# Patient Record
Sex: Female | Born: 1945 | Race: White | Hispanic: No | Marital: Married | Smoking: Never smoker
Health system: Southern US, Community
[De-identification: ages and names within clinical notes are randomized; demographics above are authoritative.]

## PROBLEM LIST (undated history)

## (undated) DIAGNOSIS — G4730 Sleep apnea, unspecified: Secondary | ICD-10-CM

## (undated) DIAGNOSIS — J45909 Unspecified asthma, uncomplicated: Secondary | ICD-10-CM

## (undated) DIAGNOSIS — I10 Essential (primary) hypertension: Secondary | ICD-10-CM

## (undated) DIAGNOSIS — R748 Abnormal levels of other serum enzymes: Secondary | ICD-10-CM

## (undated) DIAGNOSIS — E119 Type 2 diabetes mellitus without complications: Secondary | ICD-10-CM

## (undated) DIAGNOSIS — Z8489 Family history of other specified conditions: Secondary | ICD-10-CM

## (undated) DIAGNOSIS — R112 Nausea with vomiting, unspecified: Secondary | ICD-10-CM

## (undated) DIAGNOSIS — R609 Edema, unspecified: Secondary | ICD-10-CM

## (undated) DIAGNOSIS — Z9889 Other specified postprocedural states: Secondary | ICD-10-CM

## (undated) DIAGNOSIS — E785 Hyperlipidemia, unspecified: Secondary | ICD-10-CM

## (undated) DIAGNOSIS — F32A Depression, unspecified: Secondary | ICD-10-CM

## (undated) DIAGNOSIS — F329 Major depressive disorder, single episode, unspecified: Secondary | ICD-10-CM

## (undated) DIAGNOSIS — E539 Vitamin B deficiency, unspecified: Secondary | ICD-10-CM

## (undated) DIAGNOSIS — D649 Anemia, unspecified: Secondary | ICD-10-CM

## (undated) DIAGNOSIS — M1990 Unspecified osteoarthritis, unspecified site: Secondary | ICD-10-CM

## (undated) DIAGNOSIS — Z973 Presence of spectacles and contact lenses: Secondary | ICD-10-CM

## (undated) DIAGNOSIS — R002 Palpitations: Secondary | ICD-10-CM

## (undated) DIAGNOSIS — Z8719 Personal history of other diseases of the digestive system: Secondary | ICD-10-CM

## (undated) DIAGNOSIS — E559 Vitamin D deficiency, unspecified: Secondary | ICD-10-CM

## (undated) DIAGNOSIS — K219 Gastro-esophageal reflux disease without esophagitis: Secondary | ICD-10-CM

## (undated) DIAGNOSIS — R519 Headache, unspecified: Secondary | ICD-10-CM

## (undated) DIAGNOSIS — Z9109 Other allergy status, other than to drugs and biological substances: Secondary | ICD-10-CM

## (undated) DIAGNOSIS — Z972 Presence of dental prosthetic device (complete) (partial): Secondary | ICD-10-CM

## (undated) DIAGNOSIS — E039 Hypothyroidism, unspecified: Secondary | ICD-10-CM

## (undated) DIAGNOSIS — M48061 Spinal stenosis, lumbar region without neurogenic claudication: Secondary | ICD-10-CM

## (undated) DIAGNOSIS — G459 Transient cerebral ischemic attack, unspecified: Secondary | ICD-10-CM

## (undated) DIAGNOSIS — R51 Headache: Secondary | ICD-10-CM

## (undated) DIAGNOSIS — H40009 Preglaucoma, unspecified, unspecified eye: Secondary | ICD-10-CM

## (undated) HISTORY — PX: BACK SURGERY: SHX140

## (undated) HISTORY — PX: OTHER SURGICAL HISTORY: SHX169

## (undated) HISTORY — PX: COLONOSCOPY W/ BIOPSIES: SHX1374

## (undated) HISTORY — PX: ABDOMINAL HYSTERECTOMY: SHX81

## (undated) HISTORY — PX: MULTIPLE TOOTH EXTRACTIONS: SHX2053

## (undated) HISTORY — PX: APPENDECTOMY: SHX54

## (undated) HISTORY — PX: TUBAL LIGATION: SHX77

## (undated) HISTORY — PX: CATARACT EXTRACTION W/ INTRAOCULAR LENS  IMPLANT, BILATERAL: SHX1307

## (undated) HISTORY — PX: CHOLECYSTECTOMY: SHX55

---

## 1998-11-25 HISTORY — PX: BREAST EXCISIONAL BIOPSY: SUR124

## 2005-02-12 ENCOUNTER — Ambulatory Visit: Payer: Self-pay | Admitting: Internal Medicine

## 2005-08-08 ENCOUNTER — Other Ambulatory Visit: Payer: Self-pay

## 2005-08-15 ENCOUNTER — Ambulatory Visit: Payer: Self-pay | Admitting: Obstetrics & Gynecology

## 2006-12-04 ENCOUNTER — Inpatient Hospital Stay: Payer: Self-pay | Admitting: Endocrinology

## 2006-12-04 ENCOUNTER — Other Ambulatory Visit: Payer: Self-pay

## 2006-12-05 ENCOUNTER — Other Ambulatory Visit: Payer: Self-pay

## 2007-03-19 ENCOUNTER — Ambulatory Visit: Payer: Self-pay | Admitting: Endocrinology

## 2007-09-30 ENCOUNTER — Ambulatory Visit: Payer: Self-pay | Admitting: General Surgery

## 2008-03-22 ENCOUNTER — Ambulatory Visit: Payer: Self-pay | Admitting: Obstetrics and Gynecology

## 2009-03-28 ENCOUNTER — Ambulatory Visit: Payer: Self-pay | Admitting: Obstetrics & Gynecology

## 2010-03-29 ENCOUNTER — Ambulatory Visit: Payer: Self-pay | Admitting: Obstetrics & Gynecology

## 2011-04-02 ENCOUNTER — Ambulatory Visit: Payer: Self-pay | Admitting: Obstetrics & Gynecology

## 2011-12-25 ENCOUNTER — Ambulatory Visit: Payer: Self-pay | Admitting: Internal Medicine

## 2012-05-19 ENCOUNTER — Ambulatory Visit: Payer: Self-pay | Admitting: Internal Medicine

## 2012-10-28 ENCOUNTER — Ambulatory Visit: Payer: Self-pay | Admitting: General Practice

## 2012-11-09 ENCOUNTER — Ambulatory Visit: Payer: Self-pay | Admitting: Orthopedic Surgery

## 2012-11-12 ENCOUNTER — Ambulatory Visit: Payer: Self-pay | Admitting: Orthopedic Surgery

## 2013-05-20 ENCOUNTER — Ambulatory Visit: Payer: Self-pay | Admitting: Internal Medicine

## 2014-05-10 DIAGNOSIS — I10 Essential (primary) hypertension: Secondary | ICD-10-CM | POA: Diagnosis present

## 2014-05-11 DIAGNOSIS — E039 Hypothyroidism, unspecified: Secondary | ICD-10-CM | POA: Diagnosis present

## 2014-07-04 ENCOUNTER — Ambulatory Visit: Payer: Self-pay | Admitting: Ophthalmology

## 2014-07-04 LAB — HEMOGLOBIN: HGB: 12.1 g/dL (ref 12.0–16.0)

## 2014-07-12 ENCOUNTER — Ambulatory Visit: Payer: Self-pay | Admitting: Ophthalmology

## 2014-07-26 ENCOUNTER — Ambulatory Visit: Payer: Self-pay | Admitting: Ophthalmology

## 2014-12-20 ENCOUNTER — Emergency Department: Payer: Self-pay | Admitting: Emergency Medicine

## 2014-12-20 LAB — PROTIME-INR
INR: 1
PROTHROMBIN TIME: 12.7 s (ref 11.5–14.7)

## 2014-12-20 LAB — URINALYSIS, COMPLETE
BACTERIA: NONE SEEN
BILIRUBIN, UR: NEGATIVE
Blood: NEGATIVE
GLUCOSE, UR: NEGATIVE mg/dL (ref 0–75)
Ketone: NEGATIVE
Leukocyte Esterase: NEGATIVE
Nitrite: NEGATIVE
PH: 5 (ref 4.5–8.0)
Protein: NEGATIVE
RBC,UR: 1 /HPF (ref 0–5)
Specific Gravity: 1.003 (ref 1.003–1.030)
WBC UR: <1 /HPF (ref 0–5)

## 2014-12-20 LAB — CBC WITH DIFFERENTIAL/PLATELET
BASOS ABS: 0 10*3/uL (ref 0.0–0.1)
BASOS PCT: 0.7 %
Eosinophil #: 0.2 10*3/uL (ref 0.0–0.7)
Eosinophil %: 2.5 %
HCT: 37.3 % (ref 35.0–47.0)
HGB: 12.2 g/dL (ref 12.0–16.0)
Lymphocyte #: 2.4 10*3/uL (ref 1.0–3.6)
Lymphocyte %: 35.5 %
MCH: 30.2 pg (ref 26.0–34.0)
MCHC: 32.6 g/dL (ref 32.0–36.0)
MCV: 93 fL (ref 80–100)
Monocyte #: 0.4 x10 3/mm (ref 0.2–0.9)
Monocyte %: 6.3 %
NEUTROS ABS: 3.8 10*3/uL (ref 1.4–6.5)
Neutrophil %: 55 %
Platelet: 230 10*3/uL (ref 150–440)
RBC: 4.03 10*6/uL (ref 3.80–5.20)
RDW: 14.5 % (ref 11.5–14.5)
WBC: 6.9 10*3/uL (ref 3.6–11.0)

## 2014-12-20 LAB — COMPREHENSIVE METABOLIC PANEL
ALT: 56 U/L (ref 14–63)
Albumin: 3.9 g/dL (ref 3.4–5.0)
Alkaline Phosphatase: 68 U/L (ref 46–116)
Anion Gap: 7 (ref 7–16)
BUN: 11 mg/dL (ref 7–18)
Bilirubin,Total: 0.5 mg/dL (ref 0.2–1.0)
CALCIUM: 9.2 mg/dL (ref 8.5–10.1)
CO2: 28 mmol/L (ref 21–32)
Chloride: 106 mmol/L (ref 98–107)
Creatinine: 0.77 mg/dL (ref 0.60–1.30)
EGFR (African American): >60
Glucose: 134 mg/dL — ABNORMAL HIGH (ref 65–99)
OSMOLALITY: 283 (ref 275–301)
Potassium: 3.5 mmol/L (ref 3.5–5.1)
SGOT(AST): 66 U/L — ABNORMAL HIGH (ref 15–37)
Sodium: 141 mmol/L (ref 136–145)
Total Protein: 7.7 g/dL (ref 6.4–8.2)

## 2014-12-20 LAB — APTT: Activated PTT: 26.1 secs (ref 23.6–35.9)

## 2014-12-20 LAB — TROPONIN I: Troponin-I: <0.02 ng/mL

## 2015-03-18 NOTE — Op Note (Signed)
PATIENT NAME:  Margaret Frye, Margaret Frye MR#:  [id number] DATE OF BIRTH:  10-16-1946  DATE OF PROCEDURE:  07/12/2014  PREOPERATIVE DIAGNOSIS: Visually significant cataract of the right eye.   POSTOPERATIVE DIAGNOSIS: Visually significant cataract of the right eye.   OPERATIVE PROCEDURE: Cataract extraction by phacoemulsification with implant of intraocular lens to the right eye.   SURGEON: Galen ManilaWilliam Nashiya Disbrow, MD  ANESTHESIA:  1. Managed anesthesia care.  2. 50-50 mixture of 0.75% bupivacaine and 4% Xylocaine given as a retrobulbar block.   COMPLICATIONS: None.   TECHNIQUE:  Stop and chop.  DESCRIPTION OF PROCEDURE: The patient was examined and consented for this procedure in the preoperative holding area and then brought back to the Operating Room where the anesthesia team employed managed anesthesia care.  3.5 milliliters of the aforementioned mixture were placed in the right orbit on an Atkinson needle without complication. The right eye was then prepped and draped in the usual sterile ophthalmic fashion. A lid speculum was placed. The side-port blade was used to create a paracentesis and the anterior chamber was filled with viscoelastic. The keratome was used to create a near clear corneal incision. The continuous curvilinear capsulorrhexis was performed with a cystotome followed by the capsulorrhexis forceps. Hydrodissection and hydrodelineation were carried out with BSS on a blunt cannula. The lens was removed in a stop-and-chop technique. The remaining cortical material was removed with the irrigation-aspiration handpiece. The capsular bag was inflated with viscoelastic and the Tecnis ZCB00 21.5-diopter lens, serial number [id number] was placed in the capsular bag without complication. The remaining viscoelastic was removed from the eye with the irrigation-aspiration handpiece. The wounds were hydrated. The anterior chamber was flushed with Miostat and the eye was inflated to a physiologic pressure. 0.1 mL  of cefuroxime concentration 10 mg/mL was placed in the anterior chamber. The wounds were found to be water tight. The eye was dressed with Vigamox followed by Maxitrol ointment and a protective shield was placed. The patient will followup with me in one day.    ____________________________ Jerilee FieldWilliam L. Ruven Corradi, MD wlp:TT D: 07/12/2014 20:30:52 ET T: 07/12/2014 21:41:30 ET JOB#: [id number]  cc: Asser Lucena L. Pegeen Stiger, MD, <Dictator> Jerilee FieldWILLIAM L Demitria Hay MD ELECTRONICALLY SIGNED 07/13/2014 8:51

## 2015-10-03 ENCOUNTER — Other Ambulatory Visit: Payer: Self-pay | Admitting: Internal Medicine

## 2015-10-03 DIAGNOSIS — M7989 Other specified soft tissue disorders: Secondary | ICD-10-CM

## 2015-10-09 ENCOUNTER — Other Ambulatory Visit: Payer: Self-pay | Admitting: Orthopedic Surgery

## 2015-10-09 ENCOUNTER — Ambulatory Visit
Admission: RE | Admit: 2015-10-09 | Discharge: 2015-10-09 | Disposition: A | Discharge status: Home / Self Care | Payer: PPO | Source: Ambulatory Visit | Attending: Internal Medicine | Admitting: Internal Medicine

## 2015-10-09 DIAGNOSIS — M9943 Connective tissue stenosis of neural canal of lumbar region: Secondary | ICD-10-CM

## 2015-10-09 DIAGNOSIS — M7989 Other specified soft tissue disorders: Secondary | ICD-10-CM | POA: Insufficient documentation

## 2015-10-26 ENCOUNTER — Ambulatory Visit
Admission: RE | Admit: 2015-10-26 | Discharge: 2015-10-26 | Disposition: A | Discharge status: Home / Self Care | Payer: PPO | Source: Ambulatory Visit | Attending: Orthopedic Surgery | Admitting: Orthopedic Surgery

## 2015-10-26 DIAGNOSIS — M9943 Connective tissue stenosis of neural canal of lumbar region: Secondary | ICD-10-CM | POA: Insufficient documentation

## 2015-10-26 DIAGNOSIS — M5126 Other intervertebral disc displacement, lumbar region: Secondary | ICD-10-CM | POA: Insufficient documentation

## 2015-12-01 DIAGNOSIS — M5416 Radiculopathy, lumbar region: Secondary | ICD-10-CM | POA: Diagnosis not present

## 2015-12-01 DIAGNOSIS — M5126 Other intervertebral disc displacement, lumbar region: Secondary | ICD-10-CM | POA: Diagnosis not present

## 2015-12-21 DIAGNOSIS — L400 Psoriasis vulgaris: Secondary | ICD-10-CM | POA: Diagnosis not present

## 2015-12-21 DIAGNOSIS — R208 Other disturbances of skin sensation: Secondary | ICD-10-CM | POA: Diagnosis not present

## 2015-12-25 DIAGNOSIS — G8929 Other chronic pain: Secondary | ICD-10-CM | POA: Diagnosis not present

## 2015-12-25 DIAGNOSIS — M545 Low back pain: Secondary | ICD-10-CM | POA: Diagnosis not present

## 2015-12-29 DIAGNOSIS — E782 Mixed hyperlipidemia: Secondary | ICD-10-CM | POA: Diagnosis not present

## 2015-12-29 DIAGNOSIS — I10 Essential (primary) hypertension: Secondary | ICD-10-CM | POA: Diagnosis not present

## 2015-12-29 DIAGNOSIS — Z79899 Other long term (current) drug therapy: Secondary | ICD-10-CM | POA: Diagnosis not present

## 2015-12-29 DIAGNOSIS — E039 Hypothyroidism, unspecified: Secondary | ICD-10-CM | POA: Diagnosis not present

## 2015-12-29 DIAGNOSIS — R739 Hyperglycemia, unspecified: Secondary | ICD-10-CM | POA: Diagnosis not present

## 2016-01-03 DIAGNOSIS — I10 Essential (primary) hypertension: Secondary | ICD-10-CM | POA: Diagnosis not present

## 2016-01-03 DIAGNOSIS — Z1239 Encounter for other screening for malignant neoplasm of breast: Secondary | ICD-10-CM | POA: Diagnosis not present

## 2016-01-03 DIAGNOSIS — E782 Mixed hyperlipidemia: Secondary | ICD-10-CM | POA: Diagnosis not present

## 2016-01-03 DIAGNOSIS — E039 Hypothyroidism, unspecified: Secondary | ICD-10-CM | POA: Diagnosis not present

## 2016-01-03 DIAGNOSIS — R739 Hyperglycemia, unspecified: Secondary | ICD-10-CM | POA: Diagnosis not present

## 2016-01-03 DIAGNOSIS — G894 Chronic pain syndrome: Secondary | ICD-10-CM | POA: Diagnosis not present

## 2016-01-03 DIAGNOSIS — E119 Type 2 diabetes mellitus without complications: Secondary | ICD-10-CM | POA: Diagnosis not present

## 2016-01-15 ENCOUNTER — Other Ambulatory Visit: Payer: Self-pay | Admitting: Internal Medicine

## 2016-01-15 DIAGNOSIS — Z1231 Encounter for screening mammogram for malignant neoplasm of breast: Secondary | ICD-10-CM

## 2016-01-24 ENCOUNTER — Ambulatory Visit
Admission: RE | Admit: 2016-01-24 | Discharge: 2016-01-24 | Disposition: A | Discharge status: Home / Self Care | Payer: PPO | Source: Ambulatory Visit | Attending: Internal Medicine | Admitting: Internal Medicine

## 2016-01-24 DIAGNOSIS — Z1231 Encounter for screening mammogram for malignant neoplasm of breast: Secondary | ICD-10-CM | POA: Insufficient documentation

## 2016-01-31 DIAGNOSIS — L400 Psoriasis vulgaris: Secondary | ICD-10-CM | POA: Diagnosis not present

## 2016-02-14 DIAGNOSIS — R51 Headache: Secondary | ICD-10-CM | POA: Diagnosis not present

## 2016-02-14 DIAGNOSIS — G894 Chronic pain syndrome: Secondary | ICD-10-CM | POA: Diagnosis not present

## 2016-04-01 DIAGNOSIS — B078 Other viral warts: Secondary | ICD-10-CM | POA: Diagnosis not present

## 2016-04-01 DIAGNOSIS — L819 Disorder of pigmentation, unspecified: Secondary | ICD-10-CM | POA: Diagnosis not present

## 2016-04-01 DIAGNOSIS — L400 Psoriasis vulgaris: Secondary | ICD-10-CM | POA: Diagnosis not present

## 2016-05-13 DIAGNOSIS — L400 Psoriasis vulgaris: Secondary | ICD-10-CM | POA: Diagnosis not present

## 2016-05-13 DIAGNOSIS — B078 Other viral warts: Secondary | ICD-10-CM | POA: Diagnosis not present

## 2016-05-13 DIAGNOSIS — R21 Rash and other nonspecific skin eruption: Secondary | ICD-10-CM | POA: Diagnosis not present

## 2016-05-17 DIAGNOSIS — E782 Mixed hyperlipidemia: Secondary | ICD-10-CM | POA: Diagnosis not present

## 2016-05-17 DIAGNOSIS — Z79899 Other long term (current) drug therapy: Secondary | ICD-10-CM | POA: Diagnosis not present

## 2016-05-17 DIAGNOSIS — E039 Hypothyroidism, unspecified: Secondary | ICD-10-CM | POA: Diagnosis not present

## 2016-05-17 DIAGNOSIS — E119 Type 2 diabetes mellitus without complications: Secondary | ICD-10-CM | POA: Diagnosis not present

## 2016-05-17 DIAGNOSIS — I10 Essential (primary) hypertension: Secondary | ICD-10-CM | POA: Diagnosis not present

## 2016-05-17 DIAGNOSIS — G894 Chronic pain syndrome: Secondary | ICD-10-CM | POA: Diagnosis not present

## 2016-07-03 DIAGNOSIS — Z79899 Other long term (current) drug therapy: Secondary | ICD-10-CM | POA: Diagnosis not present

## 2016-07-03 DIAGNOSIS — L400 Psoriasis vulgaris: Secondary | ICD-10-CM | POA: Diagnosis not present

## 2016-07-03 DIAGNOSIS — L0390 Cellulitis, unspecified: Secondary | ICD-10-CM | POA: Diagnosis not present

## 2016-07-03 DIAGNOSIS — B078 Other viral warts: Secondary | ICD-10-CM | POA: Diagnosis not present

## 2016-07-24 DIAGNOSIS — M47816 Spondylosis without myelopathy or radiculopathy, lumbar region: Secondary | ICD-10-CM | POA: Diagnosis not present

## 2016-07-24 DIAGNOSIS — S39012A Strain of muscle, fascia and tendon of lower back, initial encounter: Secondary | ICD-10-CM | POA: Diagnosis not present

## 2016-08-26 DIAGNOSIS — E119 Type 2 diabetes mellitus without complications: Secondary | ICD-10-CM | POA: Diagnosis not present

## 2016-08-26 DIAGNOSIS — N390 Urinary tract infection, site not specified: Secondary | ICD-10-CM | POA: Diagnosis not present

## 2016-08-26 DIAGNOSIS — Z1211 Encounter for screening for malignant neoplasm of colon: Secondary | ICD-10-CM | POA: Diagnosis not present

## 2016-08-26 DIAGNOSIS — F5104 Psychophysiologic insomnia: Secondary | ICD-10-CM | POA: Diagnosis not present

## 2016-08-26 DIAGNOSIS — G894 Chronic pain syndrome: Secondary | ICD-10-CM | POA: Diagnosis not present

## 2016-08-26 DIAGNOSIS — Z0000 Encounter for general adult medical examination without abnormal findings: Secondary | ICD-10-CM | POA: Diagnosis not present

## 2016-08-26 DIAGNOSIS — E782 Mixed hyperlipidemia: Secondary | ICD-10-CM | POA: Diagnosis not present

## 2016-08-26 DIAGNOSIS — Z79899 Other long term (current) drug therapy: Secondary | ICD-10-CM | POA: Diagnosis not present

## 2016-08-26 DIAGNOSIS — Z9109 Other allergy status, other than to drugs and biological substances: Secondary | ICD-10-CM | POA: Diagnosis not present

## 2016-08-26 DIAGNOSIS — I10 Essential (primary) hypertension: Secondary | ICD-10-CM | POA: Diagnosis not present

## 2016-08-26 DIAGNOSIS — E039 Hypothyroidism, unspecified: Secondary | ICD-10-CM | POA: Diagnosis not present

## 2016-08-26 DIAGNOSIS — K219 Gastro-esophageal reflux disease without esophagitis: Secondary | ICD-10-CM | POA: Diagnosis not present

## 2016-08-26 DIAGNOSIS — Z23 Encounter for immunization: Secondary | ICD-10-CM | POA: Diagnosis not present

## 2016-09-09 DIAGNOSIS — E039 Hypothyroidism, unspecified: Secondary | ICD-10-CM | POA: Diagnosis not present

## 2016-09-09 DIAGNOSIS — I10 Essential (primary) hypertension: Secondary | ICD-10-CM | POA: Diagnosis not present

## 2016-09-09 DIAGNOSIS — E119 Type 2 diabetes mellitus without complications: Secondary | ICD-10-CM | POA: Diagnosis not present

## 2016-09-09 DIAGNOSIS — E782 Mixed hyperlipidemia: Secondary | ICD-10-CM | POA: Diagnosis not present

## 2016-09-09 DIAGNOSIS — Z79899 Other long term (current) drug therapy: Secondary | ICD-10-CM | POA: Diagnosis not present

## 2016-09-12 DIAGNOSIS — L400 Psoriasis vulgaris: Secondary | ICD-10-CM | POA: Diagnosis not present

## 2016-09-12 DIAGNOSIS — L4052 Psoriatic arthritis mutilans: Secondary | ICD-10-CM | POA: Diagnosis not present

## 2016-09-12 DIAGNOSIS — Z79899 Other long term (current) drug therapy: Secondary | ICD-10-CM | POA: Diagnosis not present

## 2016-10-21 DIAGNOSIS — L400 Psoriasis vulgaris: Secondary | ICD-10-CM | POA: Diagnosis not present

## 2016-11-28 DIAGNOSIS — Z1211 Encounter for screening for malignant neoplasm of colon: Secondary | ICD-10-CM | POA: Diagnosis not present

## 2016-11-28 DIAGNOSIS — E119 Type 2 diabetes mellitus without complications: Secondary | ICD-10-CM | POA: Diagnosis not present

## 2016-11-28 DIAGNOSIS — E782 Mixed hyperlipidemia: Secondary | ICD-10-CM | POA: Diagnosis not present

## 2016-11-28 DIAGNOSIS — E559 Vitamin D deficiency, unspecified: Secondary | ICD-10-CM | POA: Diagnosis not present

## 2016-11-28 DIAGNOSIS — Z79899 Other long term (current) drug therapy: Secondary | ICD-10-CM | POA: Diagnosis not present

## 2016-11-28 DIAGNOSIS — E039 Hypothyroidism, unspecified: Secondary | ICD-10-CM | POA: Diagnosis not present

## 2016-11-28 DIAGNOSIS — I10 Essential (primary) hypertension: Secondary | ICD-10-CM | POA: Diagnosis not present

## 2016-11-28 DIAGNOSIS — Z1231 Encounter for screening mammogram for malignant neoplasm of breast: Secondary | ICD-10-CM | POA: Diagnosis not present

## 2016-11-28 DIAGNOSIS — G894 Chronic pain syndrome: Secondary | ICD-10-CM | POA: Diagnosis not present

## 2016-12-24 DIAGNOSIS — Z1211 Encounter for screening for malignant neoplasm of colon: Secondary | ICD-10-CM | POA: Diagnosis not present

## 2017-01-22 DIAGNOSIS — L400 Psoriasis vulgaris: Secondary | ICD-10-CM | POA: Diagnosis not present

## 2017-03-04 DIAGNOSIS — E119 Type 2 diabetes mellitus without complications: Secondary | ICD-10-CM | POA: Diagnosis not present

## 2017-03-04 DIAGNOSIS — E782 Mixed hyperlipidemia: Secondary | ICD-10-CM | POA: Diagnosis not present

## 2017-03-04 DIAGNOSIS — E039 Hypothyroidism, unspecified: Secondary | ICD-10-CM | POA: Diagnosis not present

## 2017-03-04 DIAGNOSIS — I10 Essential (primary) hypertension: Secondary | ICD-10-CM | POA: Diagnosis not present

## 2017-03-04 DIAGNOSIS — Z79899 Other long term (current) drug therapy: Secondary | ICD-10-CM | POA: Diagnosis not present

## 2017-03-04 DIAGNOSIS — E559 Vitamin D deficiency, unspecified: Secondary | ICD-10-CM | POA: Diagnosis not present

## 2017-03-11 DIAGNOSIS — E039 Hypothyroidism, unspecified: Secondary | ICD-10-CM | POA: Diagnosis not present

## 2017-03-11 DIAGNOSIS — J4520 Mild intermittent asthma, uncomplicated: Secondary | ICD-10-CM | POA: Diagnosis not present

## 2017-03-11 DIAGNOSIS — M5442 Lumbago with sciatica, left side: Secondary | ICD-10-CM | POA: Diagnosis not present

## 2017-03-11 DIAGNOSIS — R945 Abnormal results of liver function studies: Secondary | ICD-10-CM | POA: Diagnosis not present

## 2017-03-11 DIAGNOSIS — E119 Type 2 diabetes mellitus without complications: Secondary | ICD-10-CM | POA: Diagnosis not present

## 2017-03-11 DIAGNOSIS — I10 Essential (primary) hypertension: Secondary | ICD-10-CM | POA: Diagnosis not present

## 2017-03-11 DIAGNOSIS — E782 Mixed hyperlipidemia: Secondary | ICD-10-CM | POA: Diagnosis not present

## 2017-03-11 DIAGNOSIS — G894 Chronic pain syndrome: Secondary | ICD-10-CM | POA: Diagnosis not present

## 2017-03-12 ENCOUNTER — Other Ambulatory Visit: Payer: Self-pay | Admitting: Internal Medicine

## 2017-03-12 DIAGNOSIS — M5442 Lumbago with sciatica, left side: Secondary | ICD-10-CM

## 2017-03-21 ENCOUNTER — Ambulatory Visit
Admission: RE | Admit: 2017-03-21 | Discharge: 2017-03-21 | Disposition: A | Discharge status: Home / Self Care | Payer: PPO | Source: Ambulatory Visit | Attending: Internal Medicine | Admitting: Internal Medicine

## 2017-03-21 DIAGNOSIS — M5136 Other intervertebral disc degeneration, lumbar region: Secondary | ICD-10-CM | POA: Diagnosis not present

## 2017-03-21 DIAGNOSIS — M5126 Other intervertebral disc displacement, lumbar region: Secondary | ICD-10-CM | POA: Insufficient documentation

## 2017-03-21 DIAGNOSIS — M48061 Spinal stenosis, lumbar region without neurogenic claudication: Secondary | ICD-10-CM | POA: Insufficient documentation

## 2017-03-21 DIAGNOSIS — M4696 Unspecified inflammatory spondylopathy, lumbar region: Secondary | ICD-10-CM | POA: Insufficient documentation

## 2017-03-21 DIAGNOSIS — M2578 Osteophyte, vertebrae: Secondary | ICD-10-CM | POA: Insufficient documentation

## 2017-03-21 DIAGNOSIS — M545 Low back pain: Secondary | ICD-10-CM | POA: Diagnosis not present

## 2017-03-21 DIAGNOSIS — M5442 Lumbago with sciatica, left side: Secondary | ICD-10-CM

## 2017-03-24 DIAGNOSIS — L400 Psoriasis vulgaris: Secondary | ICD-10-CM | POA: Diagnosis not present

## 2017-04-22 DIAGNOSIS — G8929 Other chronic pain: Secondary | ICD-10-CM | POA: Diagnosis not present

## 2017-04-22 DIAGNOSIS — Z9889 Other specified postprocedural states: Secondary | ICD-10-CM | POA: Diagnosis not present

## 2017-04-22 DIAGNOSIS — M47816 Spondylosis without myelopathy or radiculopathy, lumbar region: Secondary | ICD-10-CM | POA: Diagnosis not present

## 2017-04-22 DIAGNOSIS — M5441 Lumbago with sciatica, right side: Secondary | ICD-10-CM | POA: Diagnosis not present

## 2017-04-22 DIAGNOSIS — M5416 Radiculopathy, lumbar region: Secondary | ICD-10-CM | POA: Diagnosis not present

## 2017-04-22 DIAGNOSIS — M5442 Lumbago with sciatica, left side: Secondary | ICD-10-CM | POA: Diagnosis not present

## 2017-04-23 DIAGNOSIS — G8929 Other chronic pain: Secondary | ICD-10-CM | POA: Diagnosis not present

## 2017-04-23 DIAGNOSIS — M47816 Spondylosis without myelopathy or radiculopathy, lumbar region: Secondary | ICD-10-CM | POA: Diagnosis not present

## 2017-04-23 DIAGNOSIS — M5441 Lumbago with sciatica, right side: Secondary | ICD-10-CM | POA: Diagnosis not present

## 2017-04-23 DIAGNOSIS — M5442 Lumbago with sciatica, left side: Secondary | ICD-10-CM | POA: Diagnosis not present

## 2017-04-29 ENCOUNTER — Ambulatory Visit
Admission: RE | Admit: 2017-04-29 | Discharge: 2017-04-29 | Disposition: A | Discharge status: Home / Self Care | Payer: PPO | Source: Ambulatory Visit | Attending: Neurological Surgery | Admitting: Neurological Surgery

## 2017-04-29 ENCOUNTER — Other Ambulatory Visit: Payer: Self-pay | Admitting: Neurological Surgery

## 2017-04-29 DIAGNOSIS — M47816 Spondylosis without myelopathy or radiculopathy, lumbar region: Secondary | ICD-10-CM | POA: Diagnosis not present

## 2017-04-29 DIAGNOSIS — M5416 Radiculopathy, lumbar region: Secondary | ICD-10-CM

## 2017-04-29 DIAGNOSIS — M549 Dorsalgia, unspecified: Secondary | ICD-10-CM

## 2017-04-29 DIAGNOSIS — M4184 Other forms of scoliosis, thoracic region: Secondary | ICD-10-CM | POA: Diagnosis not present

## 2017-04-29 DIAGNOSIS — Z9889 Other specified postprocedural states: Secondary | ICD-10-CM

## 2017-04-29 DIAGNOSIS — M4186 Other forms of scoliosis, lumbar region: Secondary | ICD-10-CM | POA: Insufficient documentation

## 2017-04-29 DIAGNOSIS — Z981 Arthrodesis status: Secondary | ICD-10-CM | POA: Insufficient documentation

## 2017-04-29 DIAGNOSIS — M5430 Sciatica, unspecified side: Secondary | ICD-10-CM | POA: Diagnosis not present

## 2017-04-29 DIAGNOSIS — M545 Low back pain: Secondary | ICD-10-CM | POA: Diagnosis not present

## 2017-05-21 ENCOUNTER — Other Ambulatory Visit: Payer: Self-pay | Admitting: Neurosurgery

## 2017-05-21 DIAGNOSIS — M48062 Spinal stenosis, lumbar region with neurogenic claudication: Secondary | ICD-10-CM | POA: Diagnosis not present

## 2017-06-05 ENCOUNTER — Encounter (HOSPITAL_COMMUNITY): Payer: Self-pay

## 2017-06-05 ENCOUNTER — Encounter (HOSPITAL_COMMUNITY)
Admission: RE | Admit: 2017-06-05 | Discharge: 2017-06-05 | Disposition: A | Discharge status: Home / Self Care | Payer: PPO | Source: Ambulatory Visit | Attending: Neurosurgery | Admitting: Neurosurgery

## 2017-06-05 DIAGNOSIS — Z01810 Encounter for preprocedural cardiovascular examination: Secondary | ICD-10-CM | POA: Insufficient documentation

## 2017-06-05 DIAGNOSIS — Z01812 Encounter for preprocedural laboratory examination: Secondary | ICD-10-CM | POA: Insufficient documentation

## 2017-06-05 HISTORY — DX: Major depressive disorder, single episode, unspecified: F32.9

## 2017-06-05 HISTORY — DX: Nausea with vomiting, unspecified: R11.2

## 2017-06-05 HISTORY — DX: Abnormal levels of other serum enzymes: R74.8

## 2017-06-05 HISTORY — DX: Transient cerebral ischemic attack, unspecified: G45.9

## 2017-06-05 HISTORY — DX: Anemia, unspecified: D64.9

## 2017-06-05 HISTORY — DX: Unspecified osteoarthritis, unspecified site: M19.90

## 2017-06-05 HISTORY — DX: Other specified postprocedural states: Z98.890

## 2017-06-05 HISTORY — DX: Hypothyroidism, unspecified: E03.9

## 2017-06-05 HISTORY — DX: Headache: R51

## 2017-06-05 HISTORY — DX: Type 2 diabetes mellitus without complications: E11.9

## 2017-06-05 HISTORY — DX: Essential (primary) hypertension: I10

## 2017-06-05 HISTORY — DX: Hyperlipidemia, unspecified: E78.5

## 2017-06-05 HISTORY — DX: Vitamin D deficiency, unspecified: E55.9

## 2017-06-05 HISTORY — DX: Other allergy status, other than to drugs and biological substances: Z91.09

## 2017-06-05 HISTORY — DX: Unspecified asthma, uncomplicated: J45.909

## 2017-06-05 HISTORY — DX: Depression, unspecified: F32.A

## 2017-06-05 HISTORY — DX: Family history of other specified conditions: Z84.89

## 2017-06-05 HISTORY — DX: Presence of spectacles and contact lenses: Z97.3

## 2017-06-05 HISTORY — DX: Presence of dental prosthetic device (complete) (partial): Z97.2

## 2017-06-05 HISTORY — DX: Spinal stenosis, lumbar region without neurogenic claudication: M48.061

## 2017-06-05 HISTORY — DX: Preglaucoma, unspecified, unspecified eye: H40.009

## 2017-06-05 HISTORY — DX: Headache, unspecified: R51.9

## 2017-06-05 HISTORY — DX: Gastro-esophageal reflux disease without esophagitis: K21.9

## 2017-06-05 HISTORY — DX: Sleep apnea, unspecified: G47.30

## 2017-06-05 HISTORY — DX: Personal history of other diseases of the digestive system: Z87.19

## 2017-06-05 HISTORY — DX: Edema, unspecified: R60.9

## 2017-06-05 HISTORY — DX: Palpitations: R00.2

## 2017-06-05 HISTORY — DX: Vitamin B deficiency, unspecified: E53.9

## 2017-06-05 LAB — COMPREHENSIVE METABOLIC PANEL
ALBUMIN: 3.9 g/dL (ref 3.5–5.0)
ALT: 33 U/L (ref 14–54)
AST: 50 U/L — AB (ref 15–41)
Alkaline Phosphatase: 53 U/L (ref 38–126)
Anion gap: 10 (ref 5–15)
BILIRUBIN TOTAL: 0.8 mg/dL (ref 0.3–1.2)
BUN: 6 mg/dL (ref 6–20)
CHLORIDE: 100 mmol/L — AB (ref 101–111)
CO2: 29 mmol/L (ref 22–32)
Calcium: 9.1 mg/dL (ref 8.9–10.3)
Creatinine, Ser: 0.75 mg/dL (ref 0.44–1.00)
GFR calc Af Amer: >60 mL/min (ref >60)
GFR calc non Af Amer: >60 mL/min (ref >60)
GLUCOSE: 130 mg/dL — AB (ref 65–99)
POTASSIUM: 3.2 mmol/L — AB (ref 3.5–5.1)
SODIUM: 139 mmol/L (ref 135–145)
TOTAL PROTEIN: 6.9 g/dL (ref 6.5–8.1)

## 2017-06-05 LAB — CBC WITH DIFFERENTIAL/PLATELET
BASOS ABS: 0 10*3/uL (ref 0.0–0.1)
Basophils Relative: 0 %
Eosinophils Absolute: 0.2 10*3/uL (ref 0.0–0.7)
Eosinophils Relative: 2 %
HEMATOCRIT: 34.8 % — AB (ref 36.0–46.0)
HEMOGLOBIN: 11.3 g/dL — AB (ref 12.0–15.0)
LYMPHS PCT: 34 %
Lymphs Abs: 2.4 10*3/uL (ref 0.7–4.0)
MCH: 29.4 pg (ref 26.0–34.0)
MCHC: 32.5 g/dL (ref 30.0–36.0)
MCV: 90.4 fL (ref 78.0–100.0)
MONO ABS: 0.6 10*3/uL (ref 0.1–1.0)
MONOS PCT: 8 %
NEUTROS ABS: 4 10*3/uL (ref 1.7–7.7)
Neutrophils Relative %: 56 %
Platelets: 213 10*3/uL (ref 150–400)
RBC: 3.85 MIL/uL — ABNORMAL LOW (ref 3.87–5.11)
RDW: 15.1 % (ref 11.5–15.5)
WBC: 7.2 10*3/uL (ref 4.0–10.5)

## 2017-06-05 LAB — SURGICAL PCR SCREEN
MRSA, PCR: NEGATIVE
Staphylococcus aureus: POSITIVE — AB

## 2017-06-05 LAB — TYPE AND SCREEN
ABO/RH(D): A POS
Antibody Screen: NEGATIVE

## 2017-06-05 LAB — GLUCOSE, CAPILLARY: Glucose-Capillary: 121 mg/dL — ABNORMAL HIGH (ref 65–99)

## 2017-06-05 LAB — ABO/RH: ABO/RH(D): A POS

## 2017-06-05 NOTE — Progress Notes (Signed)
Pt denies SOB, chest pain, and being under the care of a cardiologist. Pt denies having a cardiac cath but stated that a stress test was performed > 10 years ago. Pt denies having an EKG and chest x ray within the last year. Pt denies having an A1c within the last 60 days. Pt denies recent labs. Pt chart forwarded to anesthesia to review EKG.

## 2017-06-05 NOTE — Pre-Procedure Instructions (Signed)
Timoteo ExposeBetty G Soltis  06/05/2017      [facility] [place] [id number] [address] [place] [address] Phone: [phone] Fax: [phone]    Your procedure is scheduled on Friday, June 13, 2017  [facility] at 6:00 A.M.  Call this number if you have problems the morning of surgery:  [phone]   Remember:  Do not eat food or drink liquids after midnight Thursday, June 12, 2017  Take these medicines the morning of surgery with A SIP OF WATER :DULoxetine (CYMBALTA), levothyroxine (SYNTHROID), metoprolol succinate (TOPROL-XL), omeprazole (PRILOSEC), if needed: HYDROcodone for pain, Flonase Nasal Spray for congestion, Blink Tears for dry eyes Stop taking Aspirin, vitamins, fish oil (Omega-3 Fatty Acids), and herbal medications. Do not take any NSAIDs ie: Ibuprofen, Motrin, Advil, Naproxen ( Aleve), BC and Goody Powder or any medication containing Aspirin; stop Friday, June 06, 2017    How to Manage Your Diabetes Before and After Surgery  Why is it important to control my blood sugar before and after surgery? . Improving blood sugar levels before and after surgery helps healing and can limit problems. . A way of improving blood sugar control is eating a healthy diet by: o  Eating less sugar and carbohydrates o  Increasing activity/exercise o  Talking with your doctor about reaching your blood sugar goals . High blood sugars (greater than 180 mg/dL) can raise your risk of infections and slow your recovery, so you will need to focus on controlling your diabetes during the weeks before surgery. . Make sure that the doctor who takes care of your diabetes knows about your planned surgery including the date and location.  How do I manage my blood sugar before surgery? . Check your blood sugar at least 4 times a day, starting 2 days before surgery, to make sure that the level is not too high or low. o Check  your blood sugar the morning of your surgery when you wake up and every 2 hours until you get to the Short Stay unit. . If your blood sugar is less than 70 mg/dL, you will need to treat for low blood sugar: o Do not take insulin. o Treat a low blood sugar (less than 70 mg/dL) with  cup of clear juice (cranberry or apple), 4 glucose tablets, OR glucose gel. o Recheck blood sugar in 15 minutes after treatment (to make sure it is greater than 70 mg/dL). If your blood sugar is not greater than 70 mg/dL on recheck, call [phone] for further instructions. . Report your blood sugar to the short stay nurse when you get to Short Stay.  . If you are admitted to the hospital after surgery: o Your blood sugar will be checked by the staff and you will probably be given insulin after surgery (instead of oral diabetes medicines) to make sure you have good blood sugar levels. o The goal for blood sugar control after surgery is 80-180 mg/dL.  WHAT DO I DO ABOUT MY DIABETES MEDICATION?   Marland Kitchen. Do not take oral diabetes medicines (pills) the morning of surgery such as metFORMIN (GLUCOPHAGE)   Reviewed and Endorsed by [facility] Patient Education Committee, August 2015  Do not wear jewelry, make-up or nail polish.  Do not wear lotions, powders, or perfumes, or deoderant.  Do not shave 48 hours prior to surgery.    Do not bring valuables to the hospital.  [facility] is  not responsible for any belongings or valuables.  Contacts, dentures or bridgework may not be worn into surgery.  Leave your suitcase in the car.  After surgery it may be brought to your room.  For patients admitted to the hospital, discharge time will be determined by your treatment team. Special instructions: Shower the night before surgery and the morning of surgery with CHG.  Please read over the following fact sheets that you were given. Pain Booklet, Coughing and Deep Breathing, Blood Transfusion Information, MRSA Information and  Surgical Site Infection Prevention

## 2017-06-06 LAB — HEMOGLOBIN A1C
Hgb A1c MFr Bld: 7.9 % — ABNORMAL HIGH (ref 4.8–5.6)
Mean Plasma Glucose: 180 mg/dL

## 2017-06-11 DIAGNOSIS — M5416 Radiculopathy, lumbar region: Secondary | ICD-10-CM | POA: Diagnosis not present

## 2017-06-11 DIAGNOSIS — E119 Type 2 diabetes mellitus without complications: Secondary | ICD-10-CM | POA: Diagnosis not present

## 2017-06-11 DIAGNOSIS — I10 Essential (primary) hypertension: Secondary | ICD-10-CM | POA: Diagnosis not present

## 2017-06-11 DIAGNOSIS — E039 Hypothyroidism, unspecified: Secondary | ICD-10-CM | POA: Diagnosis not present

## 2017-06-11 DIAGNOSIS — E782 Mixed hyperlipidemia: Secondary | ICD-10-CM | POA: Diagnosis not present

## 2017-06-11 DIAGNOSIS — G894 Chronic pain syndrome: Secondary | ICD-10-CM | POA: Diagnosis not present

## 2017-06-11 DIAGNOSIS — Z79899 Other long term (current) drug therapy: Secondary | ICD-10-CM | POA: Diagnosis not present

## 2017-06-12 MED ORDER — DEXAMETHASONE SODIUM PHOSPHATE 10 MG/ML IJ SOLN
10.0000 mg | INTRAMUSCULAR | Status: AC
  Administered 2017-06-13: 10 mg via INTRAVENOUS
  Filled 2017-06-12: qty 1

## 2017-06-12 MED ORDER — CEFAZOLIN SODIUM-DEXTROSE 2-4 GM/100ML-% IV SOLN
2.0000 g | INTRAVENOUS | Status: AC
  Administered 2017-06-13 (×2): 2 g via INTRAVENOUS
  Filled 2017-06-12: qty 100

## 2017-06-12 NOTE — Anesthesia Preprocedure Evaluation (Signed)
Anesthesia Evaluation  Patient identified by MRN, date of birth, ID band Patient awake    Reviewed: Allergy & Precautions, H&P , Patient's Chart, lab work & pertinent test results, reviewed documented beta blocker date and time   Airway Mallampati: II  TM Distance: >3 FB Neck ROM: full    Dental no notable dental hx.    Pulmonary asthma , sleep apnea ,    Pulmonary exam normal breath sounds clear to auscultation       Cardiovascular hypertension, On Medications  Rhythm:regular Rate:Normal     Neuro/Psych    GI/Hepatic   Endo/Other  diabetes, Type obesity  Renal/GU      Musculoskeletal   Abdominal   Peds  Hematology   Anesthesia Other Findings   Reproductive/Obstetrics                             Anesthesia Physical Anesthesia Plan  ASA: III  Anesthesia Plan: General   Post-op Pain Management:    Induction: Intravenous  PONV Risk Score and Plan:   Airway Management Planned: Oral ETT  Additional Equipment:   Intra-op Plan:   Post-operative Plan: Extubation in OR  Informed Consent: I have reviewed the patients History and Physical, chart, labs and discussed the procedure including the risks, benefits and alternatives for the proposed anesthesia with the patient or authorized representative who has indicated his/her understanding and acceptance.   Dental Advisory Given  Plan Discussed with: CRNA and Surgeon  Anesthesia Plan Comments: (  )        Anesthesia Quick Evaluation

## 2017-06-13 ENCOUNTER — Encounter (HOSPITAL_COMMUNITY)
Admission: RE | Disposition: A | Discharge status: Home Health | Payer: Self-pay | Source: Ambulatory Visit | Attending: Neurosurgery

## 2017-06-13 ENCOUNTER — Inpatient Hospital Stay (HOSPITAL_COMMUNITY)
Admission: RE | Admit: 2017-06-13 | Discharge: 2017-06-16 | DRG: 454 | Disposition: A | Discharge status: Home Health | Payer: PPO | Source: Ambulatory Visit | Attending: Neurosurgery | Admitting: Neurosurgery

## 2017-06-13 ENCOUNTER — Inpatient Hospital Stay (HOSPITAL_COMMUNITY): Payer: PPO | Admitting: Emergency Medicine

## 2017-06-13 ENCOUNTER — Inpatient Hospital Stay (HOSPITAL_COMMUNITY): Payer: PPO | Admitting: Anesthesiology

## 2017-06-13 ENCOUNTER — Inpatient Hospital Stay (HOSPITAL_COMMUNITY): Payer: PPO

## 2017-06-13 ENCOUNTER — Encounter (HOSPITAL_COMMUNITY): Payer: Self-pay | Admitting: Urology

## 2017-06-13 DIAGNOSIS — M4310 Spondylolisthesis, site unspecified: Secondary | ICD-10-CM | POA: Diagnosis not present

## 2017-06-13 DIAGNOSIS — M4180 Other forms of scoliosis, site unspecified: Secondary | ICD-10-CM | POA: Diagnosis present

## 2017-06-13 DIAGNOSIS — M48062 Spinal stenosis, lumbar region with neurogenic claudication: Secondary | ICD-10-CM | POA: Diagnosis not present

## 2017-06-13 DIAGNOSIS — Z6838 Body mass index (BMI) 38.0-38.9, adult: Secondary | ICD-10-CM

## 2017-06-13 DIAGNOSIS — E662 Morbid (severe) obesity with alveolar hypoventilation: Secondary | ICD-10-CM | POA: Diagnosis present

## 2017-06-13 DIAGNOSIS — I10 Essential (primary) hypertension: Secondary | ICD-10-CM | POA: Diagnosis not present

## 2017-06-13 DIAGNOSIS — Z7982 Long term (current) use of aspirin: Secondary | ICD-10-CM

## 2017-06-13 DIAGNOSIS — J45909 Unspecified asthma, uncomplicated: Secondary | ICD-10-CM | POA: Diagnosis present

## 2017-06-13 DIAGNOSIS — E119 Type 2 diabetes mellitus without complications: Secondary | ICD-10-CM | POA: Diagnosis present

## 2017-06-13 DIAGNOSIS — Z791 Long term (current) use of non-steroidal anti-inflammatories (NSAID): Secondary | ICD-10-CM

## 2017-06-13 DIAGNOSIS — M4316 Spondylolisthesis, lumbar region: Secondary | ICD-10-CM | POA: Diagnosis present

## 2017-06-13 DIAGNOSIS — Z7951 Long term (current) use of inhaled steroids: Secondary | ICD-10-CM

## 2017-06-13 DIAGNOSIS — Z7984 Long term (current) use of oral hypoglycemic drugs: Secondary | ICD-10-CM

## 2017-06-13 DIAGNOSIS — M419 Scoliosis, unspecified: Secondary | ICD-10-CM | POA: Diagnosis not present

## 2017-06-13 DIAGNOSIS — Z419 Encounter for procedure for purposes other than remedying health state, unspecified: Secondary | ICD-10-CM

## 2017-06-13 DIAGNOSIS — M549 Dorsalgia, unspecified: Secondary | ICD-10-CM | POA: Diagnosis present

## 2017-06-13 DIAGNOSIS — M4326 Fusion of spine, lumbar region: Secondary | ICD-10-CM | POA: Diagnosis not present

## 2017-06-13 LAB — GLUCOSE, CAPILLARY
GLUCOSE-CAPILLARY: 133 mg/dL — AB (ref 65–99)
GLUCOSE-CAPILLARY: 213 mg/dL — AB (ref 65–99)
GLUCOSE-CAPILLARY: 232 mg/dL — AB (ref 65–99)
Glucose-Capillary: 235 mg/dL — ABNORMAL HIGH (ref 65–99)

## 2017-06-13 SURGERY — POSTERIOR LUMBAR FUSION 3 LEVEL
Anesthesia: General | Site: Back

## 2017-06-13 MED ORDER — FENTANYL CITRATE (PF) 250 MCG/5ML IJ SOLN
INTRAMUSCULAR | Status: AC
  Filled 2017-06-13: qty 5

## 2017-06-13 MED ORDER — SODIUM CHLORIDE 0.9 % IR SOLN
Status: DC | PRN
  Administered 2017-06-13: 500 mL

## 2017-06-13 MED ORDER — DULOXETINE HCL 60 MG PO CPEP
60.0000 mg | ORAL_CAPSULE | Freq: Every day | ORAL | Status: DC
  Administered 2017-06-13 – 2017-06-16 (×4): 60 mg via ORAL
  Filled 2017-06-13 (×4): qty 1

## 2017-06-13 MED ORDER — ONDANSETRON HCL 4 MG PO TABS
4.0000 mg | ORAL_TABLET | Freq: Four times a day (QID) | ORAL | Status: DC | PRN
  Administered 2017-06-14 – 2017-06-16 (×2): 4 mg via ORAL
  Filled 2017-06-13 (×2): qty 1

## 2017-06-13 MED ORDER — LACTATED RINGERS IV SOLN
INTRAVENOUS | Status: DC
  Administered 2017-06-13 (×2): via INTRAVENOUS

## 2017-06-13 MED ORDER — FENTANYL CITRATE (PF) 100 MCG/2ML IJ SOLN
INTRAMUSCULAR | Status: DC | PRN
  Administered 2017-06-13 (×5): 50 ug via INTRAVENOUS
  Administered 2017-06-13: 150 ug via INTRAVENOUS
  Administered 2017-06-13: 50 ug via INTRAVENOUS

## 2017-06-13 MED ORDER — HYDROMORPHONE HCL 1 MG/ML IJ SOLN
0.5000 mg | INTRAMUSCULAR | Status: DC | PRN
  Administered 2017-06-13: 1 mg via INTRAVENOUS
  Administered 2017-06-13: 0.5 mg via INTRAVENOUS
  Administered 2017-06-14 (×2): 1 mg via INTRAVENOUS
  Filled 2017-06-13: qty 1
  Filled 2017-06-13: qty 0.5
  Filled 2017-06-13 (×3): qty 1

## 2017-06-13 MED ORDER — LIDOCAINE HCL (CARDIAC) 20 MG/ML IV SOLN
INTRAVENOUS | Status: AC
  Filled 2017-06-13: qty 5

## 2017-06-13 MED ORDER — MENTHOL 3 MG MT LOZG: 1.0000 | LOZENGE | OROMUCOSAL | Status: DC | PRN

## 2017-06-13 MED ORDER — HYDROCODONE-ACETAMINOPHEN 7.5-325 MG PO TABS
1.0000 | ORAL_TABLET | Freq: Four times a day (QID) | ORAL | Status: DC | PRN
  Administered 2017-06-13 – 2017-06-16 (×4): 1 via ORAL
  Filled 2017-06-13 (×4): qty 1

## 2017-06-13 MED ORDER — ONDANSETRON HCL 4 MG/2ML IJ SOLN
4.0000 mg | Freq: Four times a day (QID) | INTRAMUSCULAR | Status: DC | PRN
  Administered 2017-06-13: 4 mg via INTRAVENOUS
  Filled 2017-06-13: qty 2

## 2017-06-13 MED ORDER — ONDANSETRON HCL 4 MG/2ML IJ SOLN
INTRAMUSCULAR | Status: AC
  Filled 2017-06-13: qty 2

## 2017-06-13 MED ORDER — ONDANSETRON HCL 4 MG/2ML IJ SOLN
INTRAMUSCULAR | Status: DC | PRN
  Administered 2017-06-13: 4 mg via INTRAVENOUS

## 2017-06-13 MED ORDER — MIDAZOLAM HCL 2 MG/2ML IJ SOLN
INTRAMUSCULAR | Status: AC
  Filled 2017-06-13: qty 2

## 2017-06-13 MED ORDER — DIAZEPAM 5 MG PO TABS
5.0000 mg | ORAL_TABLET | Freq: Four times a day (QID) | ORAL | Status: DC | PRN
  Administered 2017-06-13: 10 mg via ORAL
  Administered 2017-06-15 – 2017-06-16 (×5): 5 mg via ORAL
  Filled 2017-06-13 (×5): qty 1

## 2017-06-13 MED ORDER — VECURONIUM BROMIDE 10 MG IV SOLR
INTRAVENOUS | Status: AC
  Filled 2017-06-13: qty 10

## 2017-06-13 MED ORDER — ASPIRIN 81 MG PO CHEW
81.0000 mg | CHEWABLE_TABLET | Freq: Every day | ORAL | Status: DC
  Administered 2017-06-13 – 2017-06-16 (×4): 81 mg via ORAL
  Filled 2017-06-13 (×4): qty 1

## 2017-06-13 MED ORDER — PROPOFOL 10 MG/ML IV BOLUS
INTRAVENOUS | Status: DC | PRN
  Administered 2017-06-13: 180 mg via INTRAVENOUS

## 2017-06-13 MED ORDER — CHLORHEXIDINE GLUCONATE CLOTH 2 % EX PADS: 6.0000 | MEDICATED_PAD | Freq: Once | CUTANEOUS | Status: DC

## 2017-06-13 MED ORDER — PANTOPRAZOLE SODIUM 40 MG PO TBEC
40.0000 mg | DELAYED_RELEASE_TABLET | Freq: Every day | ORAL | Status: DC
  Administered 2017-06-14: 40 mg via ORAL
  Filled 2017-06-13: qty 1

## 2017-06-13 MED ORDER — BUPIVACAINE HCL (PF) 0.25 % IJ SOLN
INTRAMUSCULAR | Status: DC | PRN
  Administered 2017-06-13: 30 mL

## 2017-06-13 MED ORDER — DIAZEPAM 5 MG PO TABS
ORAL_TABLET | ORAL | Status: AC
  Filled 2017-06-13: qty 2

## 2017-06-13 MED ORDER — SIMVASTATIN 40 MG PO TABS
40.0000 mg | ORAL_TABLET | Freq: Every day | ORAL | Status: DC
  Administered 2017-06-13 – 2017-06-15 (×3): 40 mg via ORAL
  Filled 2017-06-13 (×3): qty 1

## 2017-06-13 MED ORDER — ALBUMIN HUMAN 5 % IV SOLN
INTRAVENOUS | Status: DC | PRN
  Administered 2017-06-13: 11:00:00 via INTRAVENOUS

## 2017-06-13 MED ORDER — HYDROCODONE-ACETAMINOPHEN 10-325 MG PO TABS
1.0000 | ORAL_TABLET | ORAL | Status: DC | PRN
  Administered 2017-06-13: 2 via ORAL
  Administered 2017-06-14 – 2017-06-16 (×10): 1 via ORAL
  Filled 2017-06-13: qty 1
  Filled 2017-06-13: qty 2
  Filled 2017-06-13 (×10): qty 1

## 2017-06-13 MED ORDER — FENTANYL CITRATE (PF) 100 MCG/2ML IJ SOLN
25.0000 ug | INTRAMUSCULAR | Status: DC | PRN
  Administered 2017-06-13 (×3): 50 ug via INTRAVENOUS

## 2017-06-13 MED ORDER — PHENOL 1.4 % MT LIQD: 1.0000 | OROMUCOSAL | Status: DC | PRN

## 2017-06-13 MED ORDER — CEFAZOLIN SODIUM 1 G IJ SOLR
INTRAMUSCULAR | Status: AC
  Filled 2017-06-13: qty 20

## 2017-06-13 MED ORDER — METFORMIN HCL 500 MG PO TABS
500.0000 mg | ORAL_TABLET | Freq: Two times a day (BID) | ORAL | Status: DC
  Administered 2017-06-13 – 2017-06-16 (×6): 500 mg via ORAL
  Filled 2017-06-13 (×6): qty 1

## 2017-06-13 MED ORDER — EPHEDRINE 5 MG/ML INJ
INTRAVENOUS | Status: AC
  Filled 2017-06-13: qty 10

## 2017-06-13 MED ORDER — POLYETHYLENE GLYCOL 400 0.25 % OP SOLN: 1.0000 [drp] | Freq: Three times a day (TID) | OPHTHALMIC | Status: DC | PRN

## 2017-06-13 MED ORDER — THROMBIN 20000 UNITS EX SOLR
CUTANEOUS | Status: AC
  Filled 2017-06-13: qty 20000

## 2017-06-13 MED ORDER — LEVOCETIRIZINE DIHYDROCHLORIDE 5 MG PO TABS: 5.0000 mg | ORAL_TABLET | Freq: Every day | ORAL | Status: DC

## 2017-06-13 MED ORDER — VITAMIN D (ERGOCALCIFEROL) 1.25 MG (50000 UNIT) PO CAPS
50000.0000 [IU] | ORAL_CAPSULE | ORAL | Status: DC
  Administered 2017-06-15: 50000 [IU] via ORAL
  Filled 2017-06-13: qty 1

## 2017-06-13 MED ORDER — POLYVINYL ALCOHOL 1.4 % OP SOLN
1.0000 [drp] | Freq: Three times a day (TID) | OPHTHALMIC | Status: DC | PRN
  Filled 2017-06-13: qty 15

## 2017-06-13 MED ORDER — ROCURONIUM BROMIDE 50 MG/5ML IV SOLN
INTRAVENOUS | Status: AC
  Filled 2017-06-13: qty 1

## 2017-06-13 MED ORDER — LIDOCAINE HCL (CARDIAC) 20 MG/ML IV SOLN
INTRAVENOUS | Status: DC | PRN
  Administered 2017-06-13: 100 mg via INTRAVENOUS

## 2017-06-13 MED ORDER — THROMBIN 20000 UNITS EX SOLR
CUTANEOUS | Status: DC | PRN
  Administered 2017-06-13 (×2): 20 mL via TOPICAL

## 2017-06-13 MED ORDER — SODIUM CHLORIDE 0.9% FLUSH: 3.0000 mL | INTRAVENOUS | Status: DC | PRN

## 2017-06-13 MED ORDER — DEXAMETHASONE SODIUM PHOSPHATE 10 MG/ML IJ SOLN
INTRAMUSCULAR | Status: AC
  Filled 2017-06-13: qty 1

## 2017-06-13 MED ORDER — EPHEDRINE SULFATE-NACL 50-0.9 MG/10ML-% IV SOSY
PREFILLED_SYRINGE | INTRAVENOUS | Status: DC | PRN
  Administered 2017-06-13: 5 mg via INTRAVENOUS
  Administered 2017-06-13: 10 mg via INTRAVENOUS

## 2017-06-13 MED ORDER — LEVOTHYROXINE SODIUM 50 MCG PO TABS
50.0000 ug | ORAL_TABLET | Freq: Every day | ORAL | Status: DC
  Administered 2017-06-14: 50 ug via ORAL
  Filled 2017-06-13: qty 1

## 2017-06-13 MED ORDER — MIDAZOLAM HCL 5 MG/5ML IJ SOLN
INTRAMUSCULAR | Status: DC | PRN
  Administered 2017-06-13: 2 mg via INTRAVENOUS

## 2017-06-13 MED ORDER — PROPOFOL 10 MG/ML IV BOLUS
INTRAVENOUS | Status: AC
  Filled 2017-06-13: qty 20

## 2017-06-13 MED ORDER — VANCOMYCIN HCL 1000 MG IV SOLR
INTRAVENOUS | Status: AC
  Filled 2017-06-13: qty 1000

## 2017-06-13 MED ORDER — SODIUM CHLORIDE 0.9% FLUSH
3.0000 mL | Freq: Two times a day (BID) | INTRAVENOUS | Status: DC
  Administered 2017-06-13 – 2017-06-14 (×3): 3 mL via INTRAVENOUS

## 2017-06-13 MED ORDER — 0.9 % SODIUM CHLORIDE (POUR BTL) OPTIME
TOPICAL | Status: DC | PRN
Start: 2017-06-13 — End: 2017-06-13
  Administered 2017-06-13: 1000 mL

## 2017-06-13 MED ORDER — CEFAZOLIN SODIUM-DEXTROSE 1-4 GM/50ML-% IV SOLN
1.0000 g | Freq: Three times a day (TID) | INTRAVENOUS | Status: AC
  Administered 2017-06-13 – 2017-06-14 (×2): 1 g via INTRAVENOUS
  Filled 2017-06-13 (×2): qty 50

## 2017-06-13 MED ORDER — BUPIVACAINE HCL (PF) 0.25 % IJ SOLN
INTRAMUSCULAR | Status: AC
  Filled 2017-06-13: qty 30

## 2017-06-13 MED ORDER — ROCURONIUM BROMIDE 100 MG/10ML IV SOLN
INTRAVENOUS | Status: DC | PRN
  Administered 2017-06-13: 50 mg via INTRAVENOUS

## 2017-06-13 MED ORDER — TRAZODONE HCL 100 MG PO TABS
100.0000 mg | ORAL_TABLET | Freq: Every day | ORAL | Status: DC
  Administered 2017-06-13 – 2017-06-15 (×3): 100 mg via ORAL
  Filled 2017-06-13 (×3): qty 1

## 2017-06-13 MED ORDER — SUGAMMADEX SODIUM 200 MG/2ML IV SOLN
INTRAVENOUS | Status: AC
  Filled 2017-06-13: qty 2

## 2017-06-13 MED ORDER — HYPROMELLOSE (GONIOSCOPIC) 2.5 % OP SOLN
1.0000 [drp] | Freq: Three times a day (TID) | OPHTHALMIC | Status: DC | PRN
  Filled 2017-06-13: qty 15

## 2017-06-13 MED ORDER — SODIUM CHLORIDE 0.9 % IJ SOLN
INTRAMUSCULAR | Status: AC
  Filled 2017-06-13: qty 10

## 2017-06-13 MED ORDER — VANCOMYCIN HCL 1000 MG IV SOLR
INTRAVENOUS | Status: DC | PRN
  Administered 2017-06-13: 1000 mg

## 2017-06-13 MED ORDER — SUGAMMADEX SODIUM 200 MG/2ML IV SOLN
INTRAVENOUS | Status: DC | PRN
  Administered 2017-06-13: 200 mg via INTRAVENOUS

## 2017-06-13 MED ORDER — FLUTICASONE PROPIONATE 50 MCG/ACT NA SUSP
1.0000 | Freq: Every day | NASAL | Status: DC
  Administered 2017-06-13: 1 via NASAL
  Filled 2017-06-13: qty 16

## 2017-06-13 MED ORDER — FENTANYL CITRATE (PF) 100 MCG/2ML IJ SOLN
INTRAMUSCULAR | Status: AC
  Filled 2017-06-13: qty 2

## 2017-06-13 MED ORDER — METOPROLOL SUCCINATE ER 100 MG PO TB24
100.0000 mg | ORAL_TABLET | Freq: Two times a day (BID) | ORAL | Status: DC
  Administered 2017-06-13 – 2017-06-16 (×4): 100 mg via ORAL
  Filled 2017-06-13 (×5): qty 1

## 2017-06-13 MED ORDER — SODIUM CHLORIDE 0.9 % IJ SOLN
INTRAMUSCULAR | Status: AC
  Filled 2017-06-13: qty 20

## 2017-06-13 MED ORDER — SODIUM CHLORIDE 0.9 % IV SOLN
250.0000 mL | INTRAVENOUS | Status: DC
  Administered 2017-06-13: 250 mL via INTRAVENOUS

## 2017-06-13 MED ORDER — FENTANYL CITRATE (PF) 100 MCG/2ML IJ SOLN
INTRAMUSCULAR | Status: AC
  Administered 2017-06-13: 50 ug via INTRAVENOUS
  Filled 2017-06-13: qty 2

## 2017-06-13 MED ORDER — VECURONIUM BROMIDE 10 MG IV SOLR
INTRAVENOUS | Status: DC | PRN
  Administered 2017-06-13: 5 mg via INTRAVENOUS
  Administered 2017-06-13: 2 mg via INTRAVENOUS
  Administered 2017-06-13: 5 mg via INTRAVENOUS

## 2017-06-13 MED ORDER — CEFAZOLIN SODIUM-DEXTROSE 1-4 GM/50ML-% IV SOLN
1.0000 g | Freq: Three times a day (TID) | INTRAVENOUS | Status: DC
  Filled 2017-06-13 (×2): qty 50

## 2017-06-13 MED ORDER — THROMBIN 5000 UNITS EX SOLR
CUTANEOUS | Status: AC
  Filled 2017-06-13: qty 5000

## 2017-06-13 MED ORDER — PHENYLEPHRINE HCL 10 MG/ML IJ SOLN
INTRAMUSCULAR | Status: DC | PRN
  Administered 2017-06-13: 10 ug/min via INTRAVENOUS

## 2017-06-13 MED ORDER — THROMBIN 20000 UNITS EX SOLR: OROMUCOSAL | Status: DC | PRN

## 2017-06-13 MED ORDER — HYDROCHLOROTHIAZIDE 25 MG PO TABS
25.0000 mg | ORAL_TABLET | Freq: Every day | ORAL | Status: DC
  Administered 2017-06-13 – 2017-06-16 (×3): 25 mg via ORAL
  Filled 2017-06-13 (×3): qty 1

## 2017-06-13 MED ORDER — DOCUSATE SODIUM 100 MG PO CAPS
100.0000 mg | ORAL_CAPSULE | Freq: Every day | ORAL | Status: DC
  Administered 2017-06-13 – 2017-06-16 (×4): 100 mg via ORAL
  Filled 2017-06-13 (×4): qty 1

## 2017-06-13 MED ORDER — GLYCOPYRROLATE 0.2 MG/ML IJ SOLN
INTRAMUSCULAR | Status: DC | PRN
  Administered 2017-06-13: 0.2 mg via INTRAVENOUS

## 2017-06-13 SURGICAL SUPPLY — 69 items
BAG DECANTER FOR FLEXI CONT (MISCELLANEOUS) ×2 IMPLANT
BENZOIN TINCTURE PRP APPL 2/3 (GAUZE/BANDAGES/DRESSINGS) ×2 IMPLANT
BLADE CLIPPER SURG (BLADE) IMPLANT
BUR CUTTER 7.0 ROUND (BURR) IMPLANT
BUR MATCHSTICK NEURO 3.0 LAGG (BURR) ×2 IMPLANT
CANISTER SUCT 3000ML PPV (MISCELLANEOUS) ×2 IMPLANT
CAP LCK SPNE (Orthopedic Implant) ×8 IMPLANT
CAP LOCK SPINE RADIUS (Orthopedic Implant) ×8 IMPLANT
CAP LOCKING (Orthopedic Implant) ×8 IMPLANT
CARTRIDGE OIL MAESTRO DRILL (MISCELLANEOUS) ×1 IMPLANT
CONT SPEC 4OZ CLIKSEAL STRL BL (MISCELLANEOUS) ×2 IMPLANT
DECANTER SPIKE VIAL GLASS SM (MISCELLANEOUS) IMPLANT
DERMABOND ADVANCED (GAUZE/BANDAGES/DRESSINGS) ×1
DERMABOND ADVANCED .7 DNX12 (GAUZE/BANDAGES/DRESSINGS) ×1 IMPLANT
DEVICE INTERBODY ELEVATE 23X8 (Cage) ×2 IMPLANT
DEVICE INTERBODY ELEVATE 23X9 (Cage) ×4 IMPLANT
DEVICE INTERBODY ELEVATE 9X23 (Cage) ×4 IMPLANT
DIFFUSER DRILL AIR PNEUMATIC (MISCELLANEOUS) ×2 IMPLANT
DRAPE C-ARM 42X72 X-RAY (DRAPES) ×4 IMPLANT
DRAPE HALF SHEET 40X57 (DRAPES) IMPLANT
DRAPE LAPAROTOMY 100X72X124 (DRAPES) ×2 IMPLANT
DRAPE POUCH INSTRU U-SHP 10X18 (DRAPES) ×2 IMPLANT
DRAPE SURG 17X23 STRL (DRAPES) ×8 IMPLANT
DRSG OPSITE POSTOP 4X8 (GAUZE/BANDAGES/DRESSINGS) ×2 IMPLANT
DURAPREP 26ML APPLICATOR (WOUND CARE) ×2 IMPLANT
ELECT REM PT RETURN 9FT ADLT (ELECTROSURGICAL) ×2
ELECTRODE REM PT RTRN 9FT ADLT (ELECTROSURGICAL) ×1 IMPLANT
EVACUATOR 1/8 PVC DRAIN (DRAIN) ×2 IMPLANT
GAUZE SPONGE 4X4 12PLY STRL (GAUZE/BANDAGES/DRESSINGS) ×2 IMPLANT
GAUZE SPONGE 4X4 16PLY XRAY LF (GAUZE/BANDAGES/DRESSINGS) ×2 IMPLANT
GLOVE BIOGEL PI IND STRL 7.5 (GLOVE) ×1 IMPLANT
GLOVE BIOGEL PI INDICATOR 7.5 (GLOVE) ×1
GLOVE ECLIPSE 9.0 STRL (GLOVE) ×4 IMPLANT
GLOVE EXAM NITRILE LRG STRL (GLOVE) IMPLANT
GLOVE EXAM NITRILE XL STR (GLOVE) IMPLANT
GLOVE EXAM NITRILE XS STR PU (GLOVE) IMPLANT
GLOVE SS BIOGEL STRL SZ 7.5 (GLOVE) ×2 IMPLANT
GLOVE SUPERSENSE BIOGEL SZ 7.5 (GLOVE) ×2
GOWN STRL REUS W/ TWL LRG LVL3 (GOWN DISPOSABLE) ×1 IMPLANT
GOWN STRL REUS W/ TWL XL LVL3 (GOWN DISPOSABLE) ×2 IMPLANT
GOWN STRL REUS W/TWL 2XL LVL3 (GOWN DISPOSABLE) IMPLANT
GOWN STRL REUS W/TWL LRG LVL3 (GOWN DISPOSABLE) ×1
GOWN STRL REUS W/TWL XL LVL3 (GOWN DISPOSABLE) ×2
GRAFT BN 10X1XDBM MAGNIFUSE (Bone Implant) ×2 IMPLANT
GRAFT BONE MAGNIFUSE 1X10CM (Bone Implant) ×2 IMPLANT
KIT BASIN OR (CUSTOM PROCEDURE TRAY) ×2 IMPLANT
KIT ROOM TURNOVER OR (KITS) ×2 IMPLANT
MILL MEDIUM DISP (BLADE) ×2 IMPLANT
NEEDLE HYPO 22GX1.5 SAFETY (NEEDLE) ×2 IMPLANT
NS IRRIG 1000ML POUR BTL (IV SOLUTION) ×2 IMPLANT
OIL CARTRIDGE MAESTRO DRILL (MISCELLANEOUS) ×2
PACK LAMINECTOMY NEURO (CUSTOM PROCEDURE TRAY) ×2 IMPLANT
ROD 110MM (Rod) ×2 IMPLANT
ROD SPNL 110X5.5XNS TI RDS (Rod) ×2 IMPLANT
SCREW 5.75X40M (Screw) ×12 IMPLANT
SCREW 5.75X45MM (Screw) ×4 IMPLANT
SPACER SPNL STD 23X8XSTRL (Cage) ×2 IMPLANT
SPCR SPNL STD 23X8XSTRL (Cage) ×2 IMPLANT
SPONGE LAP 18X18 X RAY DECT (DISPOSABLE) ×2 IMPLANT
SPONGE SURGIFOAM ABS GEL 100 (HEMOSTASIS) ×2 IMPLANT
STRIP CLOSURE SKIN 1/2X4 (GAUZE/BANDAGES/DRESSINGS) ×2 IMPLANT
SUT VIC AB 0 CT1 18XCR BRD8 (SUTURE) ×2 IMPLANT
SUT VIC AB 0 CT1 8-18 (SUTURE) ×2
SUT VIC AB 2-0 CT1 18 (SUTURE) ×4 IMPLANT
SUT VIC AB 3-0 SH 8-18 (SUTURE) ×4 IMPLANT
TOWEL GREEN STERILE (TOWEL DISPOSABLE) ×2 IMPLANT
TOWEL GREEN STERILE FF (TOWEL DISPOSABLE) ×2 IMPLANT
TRAY FOLEY W/METER SILVER 16FR (SET/KITS/TRAYS/PACK) ×2 IMPLANT
WATER STERILE IRR 1000ML POUR (IV SOLUTION) ×2 IMPLANT

## 2017-06-13 NOTE — Progress Notes (Signed)
Patient wants "to be sure that doctor knows I've been on norco since 1994-off off on". Patient is given PRN iv med as ordered. Will continue to monitor.

## 2017-06-13 NOTE — Brief Op Note (Signed)
06/13/2017  12:16 PM  PATIENT:  Timoteo ExposeBetty G Swingler  71 y.o. female  PRE-OPERATIVE DIAGNOSIS:  Stenosis  POST-OPERATIVE DIAGNOSIS:  Stenosis  PROCEDURE:  Procedure(s): Posterior Lumbar Interbody Fusion - Lumbar two-three, Lumbar three-four, Lumbar four-five (N/A)  SURGEON:  Surgeon(s) and Role:    * Julio SicksPool, Katrece Roediger, MD - Primary    * Ditty, Loura HaltBenjamin Jared, MD - Assisting  PHYSICIAN ASSISTANT:   ASSISTANTS:    ANESTHESIA:   general  EBL:  Total I/O In: 1650 [I.V.:1400; IV Piggyback:250] Out: 670 [Urine:320; Blood:350]  BLOOD ADMINISTERED:none  DRAINS: none   LOCAL MEDICATIONS USED:  MARCAINE     SPECIMEN:  No Specimen  DISPOSITION OF SPECIMEN:  N/A  COUNTS:  YES  TOURNIQUET:  * No tourniquets in log *  DICTATION: .Dragon Dictation  PLAN OF CARE: Admit to inpatient   PATIENT DISPOSITION:  PACU - hemodynamically stable.   Delay start of Pharmacological VTE agent (>24hrs) due to surgical blood loss or risk of bleeding: yes

## 2017-06-13 NOTE — Op Note (Signed)
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  L2-3, L3-4, L4-5 posterior lumbar interbody fusion utilizing interbody cages and locally harvested autograft  L2-L5 posterior lateral arthrodesis utilizing segmental pedicle screw fixation and local autograft and morcellized allograft.  Surgeon:Hilda Wexler A.Jaxson Anglin, M.D.  Asst. Surgeon: Ditty  Anesthesia: General  Indication: 71 year old female with severe back and bilateral lower extremity symptoms failing conservative management. Workup demonstrates evidence of a progressive degenerative scoliosis with degenerative lateral listhesis and marked foraminal collapse and instability at L2-3, L3-4 and L4-5. Patient presents now for three-level lumbar decompression infusion in hopes of improving her symptoms.  Operative note: After induction anesthesia, patient position prone onto Wilson frame and a properly padded. Lumbar region prepped and draped sterilely. Incision made from L2-L5. Dissection performed bilaterally. Retractors placed. Fluoroscopy used. Levels confirmed. Decompressive laminotomies and foraminotomies were performed using high-speed drill, Kerrison rongeurs, and Leksell rongeurs. At each level of the inferior two thirds the lamina above as well as the pars interarticularis and the superior facet were removed bilaterally. The majority the inferior facet was removed bilaterally and the superior aspect of the inferior lamina was removed bilaterally. Ligament flavum elevated and resected at all 3 levels bilaterally. Decompressive laminotomies performed on the course the  exiting L2, L3, L4 and L5 nerve roots inaccessible was required for simple interbody fusion alone were performed. Bilateral discectomies then performed at all 3 levels. The spaces were then prepared for interbody fusion. With distractors placed in the patient's right side. Disc spaces were then further scraped and cleaned of soft tissue. On the left-sided L4-5-1 9 mm extra lordotic interbody expandable cage from warmth [employer] was packed with locally harvested autograft and packed into place. This was then expanded to its full extent. Distractor was removed patient's right side. Disc space once again prepared for interbody fusion. Morselize autograft packed and interspace. A second 9 mm extra lordotic cage was then impacted into place and expanded to its full extent. Procedures then repeated at L3-4 and L23 using 8 mm standard implant at L2-3 and 9 mm standard implant at all 34. Pedicles at L2, L3, L4 and L5 were then applied using surface landmarks and intraoperative fluoroscopy. Superficial bone was removed overlying the pedicle. Each pedicles and probed using pedicle awl each pedicle awl track was then probed and found to be solidly within the bone. Each pedicle awl track was then tapped with a screw tap. Each screw tap hole was probed and found to be solidly within the bone. 5.75 mm radius brand screws and [employer] were placed bilaterally at L2, L3, L4 and L5. Final fluoroscopic images were taken both the AP and lateral plane confirming good position of the cages and the pedicle screws the proper upper level with improved alignment of spine. Wound is then irrigated one final time. Morselized autograft was packed posterior laterally after the transverse processes were decorticated. Magna fuse  packets were placed bilaterally and the gutters for further augmentation of fusion. Of Gelfoam was placed over the laminotomy defects. Vancomycin powder was placed deep wound space. Wounds and close in layers with  Vicryl sutures. Steri-Strips and sterile dressing were applied. No apparent complications. Patient tolerated the procedure well and she returns to the recovery room postop.

## 2017-06-13 NOTE — Progress Notes (Signed)
Patient arrived to unit-family at her bedside. Patient is  A&O X4. Reporting pain of 10 on a scale of 0-10. PRN given per order-will continue to monitor

## 2017-06-13 NOTE — H&P (Signed)
Margaret Frye is an 71 y.o. female.   Chief Complaint: Back pain HPI: 71 year old female with history of severe intractable back pain with intermittent radiation of both lower extremities. Patient has failed conservative management. Workup demonstrates evidence of prior surgery on the left at L3-4. 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 Patient presents now for multilevel decompression infusion in hopes of improving her symptoms.  Past Medical History:  Diagnosis Date  . Anemia   . Arthritis   . Asthma   . Borderline glaucoma   . Depression   . Diabetes mellitus without complication (HCC)   . Edema   . Elevated liver enzymes   . Environmental allergies   . Family history of adverse reaction to anesthesia    Daughter had PONV  . GERD (gastroesophageal reflux disease)   . Headache    migraine  . Heart palpitations   . History of hiatal hernia   . Hyperlipidemia   . Hypertension   . Hypothyroidism   . Lumbar stenosis   . PONV (postoperative nausea and vomiting)   . Sleep apnea    does not wear CPAP " my oxygen level drops when I sleep"  . TIA (transient ischemic attack)   . Vitamin B deficiency   . Vitamin D deficiency   . Wears glasses   . Wears partial dentures     Past Surgical History:  Procedure Laterality Date  . ABDOMINAL HYSTERECTOMY      " partial"  . anterior cevical fusion    . APPENDECTOMY    . BACK SURGERY     lumbar laminectomy  . BREAST EXCISIONAL BIOPSY Left 2000   benign  . CATARACT EXTRACTION W/ INTRAOCULAR LENS  IMPLANT, BILATERAL    . CHOLECYSTECTOMY    . COLONOSCOPY W/ BIOPSIES    . MULTIPLE TOOTH EXTRACTIONS    . TUBAL LIGATION      Family History  Problem Relation Age of Onset  . Breast cancer Sister 82  . Uterine cancer Mother   . Diabetes Mother   . Glaucoma Mother   . Heart disease  Father   . Peripheral vascular disease Father    Social History:  reports that she has never smoked. She has never used smokeless tobacco. She reports that she does not drink alcohol or use drugs.  Allergies: No Known Allergies  Medications Prior to Admission  Medication Sig Dispense Refill  . aspirin 81 MG chewable tablet Chew 81 mg by mouth daily.    . Cyanocobalamin (VITAMIN B-12) 2500 MCG SUBL Place 2,500 mcg under the tongue daily.    Marland Kitchen docusate sodium (COLACE) 100 MG capsule Take 100 mg by mouth daily.    . DULoxetine (CYMBALTA) 30 MG capsule Take 60 mg by mouth daily.    . ergocalciferol (VITAMIN D2) 50000 units capsule Take 50,000 Units by mouth every Sunday.    . fluticasone (FLONASE) 50 MCG/ACT nasal spray Place 1-2 sprays into both nostrils daily as needed. For nasal congestion.    . hydrochlorothiazide (HYDRODIURIL) 25 MG tablet Take 25 mg by mouth daily.    Marland Kitchen HYDROcodone-acetaminophen (NORCO) 7.5-325 MG tablet Take 1 tablet by mouth every 6 (six) hours as needed. For pain.    Marland Kitchen levocetirizine (XYZAL) 5 MG tablet Take 5 mg by mouth at bedtime.    Marland Kitchen levothyroxine (SYNTHROID, LEVOTHROID) 50 MCG tablet Take 50  mcg by mouth daily before breakfast.    . metFORMIN (GLUCOPHAGE) 500 MG tablet Take 500 mg by mouth 2 (two) times daily.    . metoprolol succinate (TOPROL-XL) 100 MG 24 hr tablet Take 100 mg by mouth 2 (two) times daily.    . Omega-3 Fatty Acids (OMEGA 3 PO) Take 450 mg by mouth daily. OMEGA XL 150 MG PER CAPSULE    . omeprazole (PRILOSEC) 40 MG capsule Take 40 mg by mouth daily before breakfast.    . Polyethylene Glycol 400 (BLINK TEARS) 0.25 % SOLN Place 1-2 drops into both eyes 3 (three) times daily as needed (for dry eyes.).    Marland Kitchen. Potassium 99 MG TABS Take 99 mg by mouth at bedtime.    . simvastatin (ZOCOR) 40 MG tablet Take 40 mg by mouth at bedtime.    . Skin Protectants, Misc. (EUCERIN) cream Apply 1 application topically 5 (five) times daily as needed (for dry skin  throughout day as needed for handwashing).    . traZODone (DESYREL) 100 MG tablet Take 100 mg by mouth at bedtime. 30 minutes before bedtime.    . triamcinolone cream (KENALOG) 0.1 % Apply 1 application topically 2 (two) times daily as needed (for psoriasis (typically once daily)).      Results for orders placed or performed during the hospital encounter of 06/13/17 (from the past 48 hour(s))  Glucose, capillary     Status: Abnormal   Collection Time: 06/13/17  6:22 AM  Result Value Ref Range   Glucose-Capillary 133 (H) 65 - 99 mg/dL   Comment 1 Notify RN    Comment 2 Document in Chart    No results found.  Pertinent items noted in HPI and remainder of comprehensive ROS otherwise negative.  Blood pressure (!) 165/56, pulse 64, temperature 98.2 F (36.8 C), temperature source Oral, resp. rate 18, weight 98.4 kg (217 lb), SpO2 96 %.  Patient is awake and alert. She is oriented and appropriate. Her cranial nerve function is intact. Strength 5 over 5 in both upper extremities. Slight decreased dorsiflexion strength on the left. Otherwise motor strength intact. Sensory examination nonfocal. Deep and reflexes hypoactive in both lower extremities otherwise normal. Gait antalgic. Posture flexed. Examination head ears eyes and thirds unremarkable. Chest and abdomen are benign. Extremities are free from injury or deformity. Assessment/Plan L2-3, L3-4, L4-5 degenerative disc disease with disc space collapse and degenerative scoliosis and severe foraminal stenosis. Plan bilateral L2-3, L3-4, L4-5 decompressive laminotomies and foraminotomies followed by posterior lumbar interbody fusion utilizing interbody cages and locally harvested autograft. This be coupled further with posterior lateral fusion utilizing segmental pedicle screw fixation and local autografting. Risks and benefits of been explained. Patient wishes to proceed.  Duanne Duchesne A 06/13/2017, 7:29 AM

## 2017-06-13 NOTE — Evaluation (Signed)
Physical Therapy Evaluation Patient Details Name: Margaret Frye MRN: [id number] DOB: 01-22-1946 Today's Date: 06/13/2017   History of Present Illness  71 year old female with history of severe intractable back pain with intermittent radiation of both lower extremities. Patient has failed conservative management. 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  pt s/p bil L2-5 dempression, PLIF and PLA with bone grafting.  Clinical Impression  Pt admitted with/for elective lumbar fusion surgery.  Pt currently limited functionally due to the problems listed below.  (see problems list.)  Pt will benefit from PT to maximize function and safety to be able to get home safely with available assist of family.     Follow Up Recommendations No PT follow up    Equipment Recommendations  None recommended by PT    Recommendations for Other Services       Precautions / Restrictions Precautions Precautions: Back Required Braces or Orthoses: Spinal Brace Spinal Brace: Lumbar corset;Applied in sitting position      Mobility  Bed Mobility Overal bed mobility: Needs Assistance Bed Mobility: Rolling;Sidelying to Sit Rolling: Mod assist Sidelying to sit: Mod assist       General bed mobility comments: instructed in log roll and transition side to/from sit.  Transfers Overall transfer level: Needs assistance   Transfers: Sit to/from Stand;Stand Pivot Transfers Sit to Stand: Min assist;Mod assist Stand pivot transfers: Min assist       General transfer comment: boost assist , cues for hand placement  Ambulation/Gait                Stairs            Wheelchair Mobility    Modified Rankin (Stroke Patients Only)       Balance Overall balance assessment: Needs assistance Sitting-balance support: No upper extremity  supported Sitting balance-Leahy Scale: Fair       Standing balance-Leahy Scale: Poor Standing balance comment: need RW                             Pertinent Vitals/Pain Pain Assessment: Faces Faces Pain Scale: Hurts whole lot Pain Location: back Pain Descriptors / Indicators: Aching;Guarding;Grimacing Pain Intervention(s): Monitored during session    Home Living Family/patient expects to be discharged to:: Private residence Living Arrangements: Spouse/significant other Available Help at Discharge: Family;Available PRN/intermittently;Other (Comment) (hoping to get grand dtr to assist) Type of Home: House Home Access: Stairs to enter Entrance Stairs-Rails: Right;Left Entrance Stairs-Number of Steps: several Home Layout: Two level;Bed/bath upstairs Home Equipment: Walker - 2 wheels;Bedside commode;Shower seat;Grab bars - toilet      Prior Function Level of Independence: Independent               Hand Dominance        Extremity/Trunk Assessment   Upper Extremity Assessment Upper Extremity Assessment: Overall WFL for tasks assessed    Lower Extremity Assessment Lower Extremity Assessment: Overall WFL for tasks assessed (weakness bil hip flexors)       Communication   Communication: No difficulties  Cognition Arousal/Alertness: Awake/alert Behavior During Therapy: WFL for tasks assessed/performed Overall Cognitive Status: Within Functional Limits for tasks assessed  General Comments General comments (skin integrity, edema, etc.): introduced all back education incl prec, log roll, transition to sit, donning brace, lifting restrictions, progression of activity.    Exercises     Assessment/Plan    PT Assessment Patient needs continued PT services  PT Problem List Decreased strength;Decreased activity tolerance;Decreased mobility;Decreased knowledge of use of DME;Decreased knowledge of  precautions;Pain       PT Treatment Interventions Gait training;DME instruction;Stair training;Functional mobility training;Therapeutic activities;Patient/family education    PT Goals (Current goals can be found in the Care Plan section)  Acute Rehab PT Goals Patient Stated Goal: independent, back to work PT Goal Formulation: With patient Time For Goal Achievement: 06/20/17 Potential to Achieve Goals: Good    Frequency Min 5X/week   Barriers to discharge        Co-evaluation               AM-PAC PT "6 Clicks" Daily Activity  Outcome Measure Difficulty turning over in bed (including adjusting bedclothes, sheets and blankets)?: Total Difficulty moving from lying on back to sitting on the side of the bed? : Total Difficulty sitting down on and standing up from a chair with arms (e.g., wheelchair, bedside commode, etc,.)?: Total Help needed moving to and from a bed to chair (including a wheelchair)?: A Lot Help needed walking in hospital room?: A Little Help needed climbing 3-5 steps with a railing? : A Lot 6 Click Score: 10    End of Session Equipment Utilized During Treatment: Back brace Activity Tolerance: Patient tolerated treatment well Patient left: in chair;with call bell/phone within reach;with chair alarm set Nurse Communication: Mobility status PT Visit Diagnosis: Unsteadiness on feet (R26.81);Pain;Muscle weakness (generalized) (M62.81) Pain - part of body:  (back)    Time: [id number] PT Time Calculation (min) (ACUTE ONLY): 32 min   Charges:   PT Evaluation $PT Eval Low Complexity: 1 Procedure PT Treatments $Therapeutic Activity: 8-22 mins   PT G Codes:        06/13/2017  [place] BingKen Atisha Hamidi, PT [phone]  (pager)  Eliseo GumKenneth V Balbina Depace 06/13/2017, 5:26 PM

## 2017-06-13 NOTE — Anesthesia Procedure Notes (Signed)
Procedure Name: Intubation Date/Time: 06/13/2017 8:00 AM Performed by: Kyung Rudd Pre-anesthesia Checklist: Patient identified, Emergency Drugs available, Suction available and Patient being monitored Patient Re-evaluated:Patient Re-evaluated prior to induction Oxygen Delivery Method: Circle system utilized Preoxygenation: Pre-oxygenation with 100% oxygen Induction Type: IV induction Ventilation: Mask ventilation without difficulty Laryngoscope Size: Mac and 3 Grade View: Grade I Tube type: Oral Tube size: 7.0 mm Number of attempts: 1 Airway Equipment and Method: Stylet Placement Confirmation: ETT inserted through vocal cords under direct vision,  positive ETCO2 and breath sounds checked- equal and bilateral Secured at: 21 cm Tube secured with: Tape Dental Injury: Teeth and Oropharynx as per pre-operative assessment

## 2017-06-13 NOTE — Transfer of Care (Signed)
Immediate Anesthesia Transfer of Care Note  Patient: Margaret ExposeBetty G Frye  Procedure(s) Performed: Procedure(s): Posterior Lumbar Interbody Fusion - Lumbar two-three, Lumbar three-four, Lumbar four-five (N/A)  Patient Location: PACU  Anesthesia Type:General  Level of Consciousness: awake  Airway & Oxygen Therapy: Patient Spontanous Breathing and Patient connected to nasal cannula oxygen  Post-op Assessment: Report given to RN, Post -op Vital signs reviewed and stable and Patient moving all extremities X 4  Post vital signs: Reviewed and stable  Last Vitals:  Vitals:   06/13/17 0620 06/13/17 1230  BP: (!) 165/56 (P) 134/70  Pulse:  67  Resp:  16  Temp:  36.7 C    Last Pain:  Vitals:   06/13/17 0637  TempSrc:   PainSc: 3          Complications: No apparent anesthesia complications

## 2017-06-14 LAB — GLUCOSE, CAPILLARY
GLUCOSE-CAPILLARY: 176 mg/dL — AB (ref 65–99)
GLUCOSE-CAPILLARY: 173 mg/dL — AB (ref 65–99)
GLUCOSE-CAPILLARY: 141 mg/dL — AB (ref 65–99)
Glucose-Capillary: 189 mg/dL — ABNORMAL HIGH (ref 65–99)

## 2017-06-14 MED ORDER — PANTOPRAZOLE SODIUM 40 MG PO TBEC
40.0000 mg | DELAYED_RELEASE_TABLET | Freq: Every day | ORAL | Status: DC
  Administered 2017-06-15 – 2017-06-16 (×2): 40 mg via ORAL
  Filled 2017-06-14 (×2): qty 1

## 2017-06-14 MED ORDER — FLUTICASONE PROPIONATE 50 MCG/ACT NA SUSP
1.0000 | Freq: Every day | NASAL | Status: DC
  Administered 2017-06-14: 1 via NASAL
  Filled 2017-06-14: qty 16

## 2017-06-14 MED ORDER — LEVOTHYROXINE SODIUM 50 MCG PO TABS
50.0000 ug | ORAL_TABLET | Freq: Every day | ORAL | Status: DC
  Administered 2017-06-15 – 2017-06-16 (×2): 50 ug via ORAL
  Filled 2017-06-14 (×2): qty 1

## 2017-06-14 MED ORDER — INSULIN ASPART 100 UNIT/ML ~~LOC~~ SOLN
0.0000 [IU] | Freq: Three times a day (TID) | SUBCUTANEOUS | Status: DC
Start: 2017-06-14 — End: 2017-06-16
  Administered 2017-06-14 – 2017-06-15 (×4): 3 [IU] via SUBCUTANEOUS
  Administered 2017-06-16: 2 [IU] via SUBCUTANEOUS
  Administered 2017-06-16: 3 [IU] via SUBCUTANEOUS

## 2017-06-14 NOTE — Progress Notes (Signed)
Patient's last BM was Friday(07/20). Declined any aid to assist with BM, she is on a daily dose of colace. She states that she has not "put much in my stomach" and would "go with time" Spouse at her bedside-will continue to monitor.

## 2017-06-14 NOTE — Progress Notes (Signed)
Subjective: Patient reports still quite sore  Objective: Vital signs in last 24 hours: Temp:  [98 F (36.7 C)-99.5 F (37.5 C)] 99.2 F (37.3 C) (07/21 0509) Pulse Rate:  [57-69] 68 (07/21 0509) Resp:  [15-20] 20 (07/21 0509) BP: (112-147)/(46-71) 118/46 (07/21 0509) SpO2:  [94 %-100 %] 97 % (07/21 0509) Weight:  [222 lb 10.6 oz (101 kg)] 222 lb 10.6 oz (101 kg) (07/20 2143)  Intake/Output from previous day: 07/20 0730 - 07/21 0729 In: 2319.6 [P.O.:360; I.V.:1609.6; IV Piggyback:250] Out: 1225 [Urine:875; Blood:350] Intake/Output this shift: No intake/output data recorded.  Physical Exam: Up in chair in brace  Lab Results: No results for input(s): WBC, HGB, HCT, PLT in the last 72 hours. BMET No results for input(s): NA, K, CL, CO2, GLUCOSE, BUN, CREATININE, CALCIUM in the last 72 hours.  Studies/Results: Dg Lumbar Spine 2-3 Views  Result Date: 06/13/2017 CLINICAL DATA:  Lumbar fusion EXAM: DG C-ARM 61-120 MIN; LUMBAR SPINE - 2-3 VIEW COMPARISON:  None. FLUOROSCOPY TIME:  Fluoroscopy Time:  1 minutes 5 seconds Radiation Exposure Index (if provided by the fluoroscopic device): Of ala Number of Acquired Spot Images: 2 FINDINGS: Pedicle screws are seen at L2, L3, L4 and L5 with interbody fusion at the intervening levels. Posterior fixation rods are not visualized at this time. IMPRESSION: Multilevel lumbar fusion. Electronically Signed   By: Alcide CleverMark  Lukens M.D.   On: 06/13/2017 11:56   Dg C-arm 61-120 Min  Result Date: 06/13/2017 CLINICAL DATA:  Lumbar fusion EXAM: DG C-ARM 61-120 MIN; LUMBAR SPINE - 2-3 VIEW COMPARISON:  None. FLUOROSCOPY TIME:  Fluoroscopy Time:  1 minutes 5 seconds Radiation Exposure Index (if provided by the fluoroscopic device): Of ala Number of Acquired Spot Images: 2 FINDINGS: Pedicle screws are seen at L2, L3, L4 and L5 with interbody fusion at the intervening levels. Posterior fixation rods are not visualized at this time. IMPRESSION: Multilevel lumbar  fusion. Electronically Signed   By: Alcide CleverMark  Lukens M.D.   On: 06/13/2017 11:56    Assessment/Plan: Mobilize with PT.  Work on [employer] (started sliding scale).  Pain mgt.    LOS: 1 day    Dorian HeckleSTERN,Leeum Sankey D, MD 06/14/2017, 7:38 AM

## 2017-06-14 NOTE — Progress Notes (Signed)
Physical Therapy Treatment Patient Details Name: Margaret SkainsBetty G Schwager MRN: [id number] DOB: 1945/12/31 Today's Date: 06/14/2017    History of Present Illness 71 year old female with history of severe intractable back pain with intermittent radiation of both lower extremities. PMH includes anemia, arthritis, DM, depression, borderline glaucoma, asthma, HTN, HLD, TIA. pt s/p bil L2-5 dempression, PLIF and PLA with bone grafting.    PT Comments    Pt is making steady progress with mobility toward goals. Pt was able to increase activity today with hall ambulation and overall less assistance needed with all aspects of mobility. Acute PT to continue to progress pt toward goals during acute hospital stay.    Follow Up Recommendations  No PT follow up     Equipment Recommendations  None recommended by PT    Precautions / Restrictions Precautions Precautions: Back;Fall Precaution Booklet Issued: No Precaution Comments: pt able to recall 3/3 back precautions without cues, reminder cues to demo with mobility Required Braces or Orthoses: Spinal Brace Spinal Brace: Lumbar corset;Applied in sitting position (pt able to self don/doff with cues) Restrictions Weight Bearing Restrictions: No    Mobility  Bed Mobility Overal bed mobility: Needs Assistance Bed Mobility: Sit to Sidelying Rolling: Min guard Sidelying to sit: Min assist     Sit to sidelying: Min assist General bed mobility comments: min guard assist with increased time/cues for pt to roll onto her right side. min assist to complete trunk transition with sidelying to sitting at EOB. min assit return to right sidelying after gait (assistance needed to clear bed surface with bil legs). pt then able to roll herself onto her back and bridge to readjust her position in bed. all bed mobility performed with bed flat and no rails used.   Transfers Overall transfer level: Needs assistance Equipment used: Rolling walker (2 wheeled) Transfers: Sit  to/from Stand Sit to Stand: Min guard         General transfer comment: cues needed for safe placement of hands with sit/stand transfers and for anterior weight shifting with standing from bed at it's lowest height.   Ambulation/Gait Ambulation/Gait assistance: Min guard Ambulation Distance (Feet): 200 Feet Assistive device: Rolling walker (2 wheeled) Gait Pattern/deviations: Step-through pattern;Decreased stride length;Narrow base of support Gait velocity: decreased Gait velocity interpretation: Below normal speed for age/gender General Gait Details: slow,steady and guarded gait with step through pattern. no balance issues noted. occasional cues on walker position with gait.        Cognition Arousal/Alertness: Awake/alert Behavior During Therapy: WFL for tasks assessed/performed Overall Cognitive Status: Within Functional Limits for tasks assessed       Pertinent Vitals/Pain Pain Assessment: 0-10 Pain Score: 5  Pain Location: back Pain Descriptors / Indicators: Aching;Sore;Operative site guarding Pain Intervention(s): Limited activity within patient's tolerance;Monitored during session;Premedicated before session;Repositioned    Home Living Family/patient expects to be discharged to:: Private residence Living Arrangements: Spouse/significant other Available Help at Discharge: Family;Other (Comment);Available PRN/intermittently (hoping to get granddaughter to assist) Type of Home: House Home Access: Stairs to enter Entrance Stairs-Rails: Right;Left Home Layout: Two level;Bed/bath upstairs Home Equipment: Walker - 2 wheels;Bedside commode;Shower seat;Grab bars - toilet;Adaptive equipment      Prior Function Level of Independence: Independent          PT Goals (current goals can now be found in the care plan section) Acute Rehab PT Goals Patient Stated Goal: go back to work PT Goal Formulation: With patient Time For Goal Achievement: 06/20/17 Potential to Achieve  Goals: Good Progress towards PT  goals: Progressing toward goals    Frequency    Min 5X/week      PT Plan Current plan remains appropriate    AM-PAC PT "6 Clicks" Daily Activity  Outcome Measure  Difficulty turning over in bed (including adjusting bedclothes, sheets and blankets)?: A Little Difficulty moving from lying on back to sitting on the side of the bed? : A Little Difficulty sitting down on and standing up from a chair with arms (e.g., wheelchair, bedside commode, etc,.)?: A Little Help needed moving to and from a bed to chair (including a wheelchair)?: A Little Help needed walking in hospital room?: A Little Help needed climbing 3-5 steps with a railing? : A Lot 6 Click Score: 17    End of Session Equipment Utilized During Treatment: Gait belt;Back brace Activity Tolerance: Patient tolerated treatment well;No increased pain Patient left: in bed;with call bell/phone within reach;with family/visitor present Nurse Communication: Mobility status PT Visit Diagnosis: Unsteadiness on feet (R26.81);Other abnormalities of gait and mobility (R26.89);Muscle weakness (generalized) (M62.81)     Time: 1610-9604 PT Time Calculation (min) (ACUTE ONLY): 23 min  Charges:  $Gait Training: 8-22 mins $Therapeutic Activity: 8-22 mins                    Sallyanne Kuster, PTA, [facility] Acute [employer] Office- [phone] 06/14/17, 10:26 AM   Sallyanne Kuster 06/14/2017, 10:23 AM

## 2017-06-14 NOTE — Evaluation (Addendum)
Occupational Therapy Evaluation Patient Details Name: Margaret SkainsBetty G Munoz MRN: [id number] DOB: 1946/10/26 Today's Date: 06/14/2017    History of Present Illness 71 year old female with history of severe intractable back pain with intermittent radiation of both lower extremities. PMH includes anemia, arthritis, DM, depression, borderline glaucoma, asthma, HTN, HLD, TIA. pt s/p bil L2-5 dempression, PLIF and PLA with bone grafting.   Clinical Impression   Pt s/p above. Pt independent with ADLs, PTA. Feel pt will benefit from acute OT to increase independence prior to d/c.     Follow Up Recommendations  DC plan and follow up therapy as arranged by surgeon;Supervision/Assistance - 24 hour    Equipment Recommendations  Other (comment) (additional AE if wanted)    Recommendations for Other Services       Precautions / Restrictions Precautions Precautions: Back;Fall Precaution Booklet Issued: No Precaution Comments: educated on back precautions Required Braces or Orthoses: Spinal Brace Spinal Brace: Lumbar corset (adjusted in sitting position) Restrictions Weight Bearing Restrictions: No      Mobility Bed Mobility               General bed mobility comments: not assessed  Transfers Overall transfer level: Needs assistance Equipment used: Rolling walker (2 wheeled) Transfers: Sit to/from Stand Sit to Stand: Min guard              Balance        no LOB in session.                                   ADL either performed or assessed with clinical judgement   ADL Overall ADL's : Needs assistance/impaired Eating/Feeding: Independent;Sitting               Upper Body Dressing : Set up;Supervision/safety;Sitting   Lower Body Dressing: Maximal assistance;Sit to/from stand   Toilet Transfer: Min guard;Ambulation;RW (sit to stand from bed)           Functional mobility during ADLs: Min guard;Rolling walker General ADL Comments: discussed AE      Vision         Perception     Praxis      Pertinent Vitals/Pain Pain Assessment: 0-10 Pain Score: 4  Pain Location: back Pain Descriptors / Indicators: Aching Pain Intervention(s): Monitored during session;Repositioned     Hand Dominance     Extremity/Trunk Assessment Upper Extremity Assessment Upper Extremity Assessment: Overall WFL for tasks assessed   Lower Extremity Assessment Lower Extremity Assessment: Defer to PT evaluation       Communication Communication Communication: No difficulties   Cognition Arousal/Alertness: Awake/alert Behavior During Therapy: WFL for tasks assessed/performed Overall Cognitive Status: Within Functional Limits for tasks assessed                                     General Comments       Exercises     Shoulder Instructions      Home Living Family/patient expects to be discharged to:: Private residence Living Arrangements: Spouse/significant other Available Help at Discharge: Family;Other (Comment);Available PRN/intermittently (hoping to get granddaughter to assist) Type of Home: House Home Access: Stairs to enter [employer] CorporationEntrance Stairs-Number of Steps: several Entrance Stairs-Rails: Right;Left Home Layout: Two level;Bed/bath upstairs Alternate Level Stairs-Number of Steps: flight Alternate Level Stairs-Rails: Right;Left Bathroom Shower/Tub: Chief Strategy OfficerTub/shower unit   Bathroom Toilet: Standard  Home Equipment: Walker - 2 wheels;Bedside commode;Shower seat;Grab bars - toilet;Adaptive equipment Adaptive Equipment: Reacher;Other (Comment) (toilet aid)        Prior Functioning/Environment Level of Independence: Independent                 OT Problem List: Decreased strength;Decreased range of motion;Decreased activity tolerance;Pain;Decreased knowledge of precautions;Decreased knowledge of use of DME or AE      OT Treatment/Interventions: Self-care/ADL training;DME and/or AE instruction;Therapeutic  activities;Patient/family education;Balance training    OT Goals(Current goals can be found in the care plan section) Acute Rehab OT Goals Patient Stated Goal: go back to work OT Goal Formulation: With patient Time For Goal Achievement: 06/21/17 Potential to Achieve Goals: Good ADL Goals Pt Will Perform Lower Body Dressing: with adaptive equipment;sit to/from stand;with set-up;with supervision Pt Will Transfer to Toilet: with set-up;with supervision;ambulating (3 in 1 over commode) Pt Will Perform Toileting - Clothing Manipulation and hygiene: with set-up;with supervision;sit to/from stand (with or without AE) Pt Will Perform Tub/Shower Transfer: Tub transfer;with set-up;with supervision;ambulating;shower seat;rolling walker Additional ADL Goal #1: Pt will independently verbalize 3/3 back precautions and maintain during session. Additional ADL Goal #2: Pt will don/doff back brace with setup assist.  OT Frequency: Min 2X/week   Barriers to D/C:            Co-evaluation              AM-PAC PT "6 Clicks" Daily Activity     Outcome Measure Help from another person eating meals?: None Help from another person taking care of personal grooming?: A Little Help from another person toileting, which includes using toliet, bedpan, or urinal?: A Little Help from another person bathing (including washing, rinsing, drying)?: A Lot Help from another person to put on and taking off regular upper body clothing?: A Little Help from another person to put on and taking off regular lower body clothing?: A Lot 6 Click Score: 17   End of Session Equipment Utilized During Treatment: Gait belt;Rolling walker;Back brace Nurse Communication: Other (comment) (pt dizzy)  Activity Tolerance: Other (comment) (dizzy) Patient left: in chair;with call bell/phone within reach  OT Visit Diagnosis: Pain Pain - Right/Left:  (back) Pain - part of body:  (back)                Time: 1610-9604 OT Time  Calculation (min): 18 min Charges:  OT General Charges $OT Visit: 1 Procedure OT Evaluation $OT Eval Moderate Complexity: 1 Procedure G-Codes:     Earlie Raveling OTR/L 06/14/2017, 10:06 AM

## 2017-06-14 NOTE — Progress Notes (Signed)
Incentive spirometer given, educated and observed patient as she used it. Achieved 1750. Will continue to monitor

## 2017-06-15 LAB — GLUCOSE, CAPILLARY
GLUCOSE-CAPILLARY: 181 mg/dL — AB (ref 65–99)
GLUCOSE-CAPILLARY: 199 mg/dL — AB (ref 65–99)
GLUCOSE-CAPILLARY: 165 mg/dL — AB (ref 65–99)
Glucose-Capillary: 170 mg/dL — ABNORMAL HIGH (ref 65–99)

## 2017-06-15 NOTE — Progress Notes (Signed)
Pt ambulated in hall 200 ft using front wheel rolling walker and up to bathroom X 3. No BM, but active bowel sounds X 4 quadrants and passing gas. Pt has had diminished appetite. Dressing clean, dry, and intact. Pain controlled with po PRN pain meds overnight. Will continue to monitor.

## 2017-06-15 NOTE — Progress Notes (Addendum)
Pt seen and examined.  No issues overnight. Pain fairly manageable Does not believe she is ready to go home  EXAM: Temp:  [98.9 F (37.2 C)-99.6 F (37.6 C)] 98.9 F (37.2 C) (07/22 0039) Pulse Rate:  [69-73] 70 (07/22 0039) Resp:  [18-20] 20 (07/22 0039) BP: (112-120)/(43-50) 115/49 (07/22 0039) SpO2:  [90 %-97 %] 97 % (07/22 0039) Intake/Output      07/21 0701 - 07/22 0700   P.O. 360   Total Intake(mL/kg) 360 (3.6)   Urine (mL/kg/hr) 250 (0.1)   Total Output 250   Net +110       Urine Occurrence 5 x    Awake and alert Follows commands throughout Strength stable Incision c/d/i   Plan Progressing nicely although she feels as though she isn't ready to go home. Would like one more day. Continue to mobilize with PT/OT d/c tomorrow

## 2017-06-15 NOTE — Progress Notes (Signed)
Physical Therapy Treatment Patient Details Name: Margaret SkainsBetty G Vangorden MRN: [id number] DOB: 10/09/1946 Today's Date: 06/15/2017    History of Present Illness 71 year old female with history of severe intractable back pain with intermittent radiation of both lower extremities. PMH includes anemia, arthritis, DM, depression, borderline glaucoma, asthma, HTN, HLD, TIA. pt s/p bil L2-5 dempression, PLIF and PLA with bone grafting.    PT Comments    Pt making progress toward home d/c, but expressed some concerns.  Pt unable to access second floor of home until able to ascend flight of stairs so will need to stay on first floor, and unable to get on/off high bed at home due to physical limitations and concern for safety of herself and her caregivers. 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  Recommend hospital bed to facilitate easier ingress/egress and therefore promote mobility and to assist with ease in breathing.  If pt does not have, will need RW and recommend HHPT to address mobility limitations in home environment.   MD and RN: Please address these pt questions:  Does pt still need to wear O2 at night, and if so, are there alternatives or options that are comfortable for pt?  Pt asking for clarification of pain medication schedule for home, was assured this will be in d/c instructions.   Follow Up Recommendations  Home health PT     Equipment Recommendations  Rolling walker with 5" wheels  Hospital bed   Recommendations for Other Services       Precautions / Restrictions Precautions Precautions: Back;Fall Precaution Booklet Issued: No Precaution Comments: pt able to recall 3/3 back precautions without cues, reminder cues to demo with mobility Required Braces or Orthoses: Spinal Brace Spinal Brace: Lumbar corset;Applied in sitting position Restrictions Weight Bearing Restrictions: No Other  Position/Activity Restrictions: recommend keep HOB up as pt has breathing issues (supposed to wear O2 at night, feels like she's suffocating)    Mobility  Bed Mobility Overal bed mobility: Needs Assistance Bed Mobility: Rolling;Sidelying to Sit;Sit to Sidelying Rolling: Min assist Sidelying to sit: Mod assist     Sit to sidelying: Min assist General bed mobility comments: removed rail, bed flat to simulate home, pt needs moderate assistance with bedpad to ccomplete supine to sidelying, and is unable to clear mattress by pushing up without pulling on therapist; instructional cues to sit>sidelying to prevent twisting at trunk, min guard to elevate legs to bed surface  Transfers Overall transfer level: Needs assistance Equipment used: Rolling walker (2 wheeled) Transfers: Sit to/from Stand Sit to Stand: Min guard         General transfer comment: stand from lowest bed height with incr time, minimal cues for hands/transition to RW handles, and pt with effort able to come to standing.  Raised bed to simulate home bed height and attempted stand>sit, but pt is unable to get hips up to bed surface, has no leverage to scoot back, and is visibly anxious during the attempt.  lowered bed and pt able to safely and confidently sit and scoot back for EOB.  Ambulation/Gait Ambulation/Gait assistance:  (pt amb 200 feet earlier, declines walking again, will later)               Stairs Stairs: Yes   Stair Management: With walker;No rails;Forwards Number of Stairs: 1 (threshold) General stair comments: discussed with pt access to home, pt has one short step up into  door, has used RW in the past, and does not anticipate issues getting into home safely (will have family help as well).  however, pt has flight to 2nd floor and is unwilling/unable to routinely access bedroom on 2nd floor, therefore needs to stay on first.  See general comments and clinical impression for recommendation  for hospital  bed  Wheelchair Mobility    Modified Rankin (Stroke Patients Only)       Balance Overall balance assessment: Needs assistance         Standing balance support: During functional activity;Bilateral upper extremity supported Standing balance-Leahy Scale: Poor Standing balance comment: dependant on RW for standing/walking                            Cognition Arousal/Alertness: Awake/alert Behavior During Therapy: WFL for tasks assessed/performed Overall Cognitive Status: Within Functional Limits for tasks assessed                                 General Comments: lying in bed, sleepy but interactive and coherent, seems overwhelmed      Exercises      General Comments General comments (skin integrity, edema, etc.): Pt/family concerned about bed mobility deficits and asked about hospital bed.  Assessment today to include need for, see clinical impression above      Pertinent Vitals/Pain Pain Assessment: 0-10 Pain Score: 4  Pain Location: back Pain Descriptors / Indicators: Aching;Sore;Operative site guarding Pain Intervention(s): Limited activity within patient's tolerance;Monitored during session;Repositioned    Home Living                      Prior Function            PT Goals (current goals can now be found in the care plan section) Acute Rehab PT Goals Patient Stated Goal: go back to work PT Goal Formulation: With patient Progress towards PT goals: Progressing toward goals    Frequency    Min 5X/week      PT Plan Discharge plan needs to be updated    Co-evaluation              AM-PAC PT "6 Clicks" Daily Activity  Outcome Measure  Difficulty turning over in bed (including adjusting bedclothes, sheets and blankets)?: A Lot Difficulty moving from lying on back to sitting on the side of the bed? : A Lot Difficulty sitting down on and standing up from a chair with arms (e.g., wheelchair, bedside commode,  etc,.)?: A Little Help needed moving to and from a bed to chair (including a wheelchair)?: A Little Help needed walking in hospital room?: A Little Help needed climbing 3-5 steps with a railing? : A Lot 6 Click Score: 15    End of Session   Activity Tolerance: Patient tolerated treatment well;No increased pain Patient left: in bed;with call bell/phone within reach;with family/visitor present Nurse Communication: Mobility status PT Visit Diagnosis: Unsteadiness on feet (R26.81);Pain;Other abnormalities of gait and mobility (R26.89) Pain - Right/Left:  (center)     Time: 4098-1191 PT Time Calculation (min) (ACUTE ONLY): 32 min  Charges:  $Therapeutic Activity: 23-37 mins                    G Codes:       Narda Amber, PT, DPT, MS Board Certified Geriatric Specialist   Narda Amber [facility] 06/15/2017, 11:22  AM

## 2017-06-15 NOTE — Progress Notes (Addendum)
Occupational Therapy Treatment Patient Details Name: ADITHI GAMMON MRN: [id number] DOB: February 06, 1946 Today's Date: 06/15/2017    History of present illness 71 year old female with history of severe intractable back pain with intermittent radiation of both lower extremities. PMH includes anemia, arthritis, DM, depression, borderline glaucoma, asthma, HTN, HLD, TIA. pt s/p bil L2-5 dempression, PLIF and PLA with bone grafting.   OT comments  Pt progressing well toward OT goals. Pt educated concerning safe tub transfers with shower seat and she was able to complete with safe technique with min assist. Additionally educated on compensatory strategies to maximize independence and adherence to back precautions with standing grooming tasks. Discussed energy conservation techniques and pt verbalizes understanding. Pt additionally was able to complete bed mobility with improved independence but relies heavily on use of bed rail and elevated HOB and reports that her bed at home is too high to safely utilize. Pt is interested in hospital bed to address these concerns and feel she would benefit. OT will continue to follow while admitted.    Follow Up Recommendations  DC plan and follow up therapy as arranged by surgeon;Supervision/Assistance - 24 hour    Equipment Recommendations  Other (comment);Hospital bed (AE kit)    Recommendations for Other Services      Precautions / Restrictions Precautions Precautions: Back;Fall Precaution Booklet Issued: No Precaution Comments: pt able to recall 3/3 back precautions without cues, reminder cues to demo with mobility Required Braces or Orthoses: Spinal Brace Spinal Brace: Lumbar corset;Applied in sitting position Restrictions Weight Bearing Restrictions: No       Mobility Bed Mobility Overal bed mobility: Needs Assistance Bed Mobility: Rolling;Sidelying to Sit;Sit to Sidelying Rolling: Min guard Sidelying to sit: Min guard       General bed mobility  comments: Min guard assist and use of bed rail for safety this session. VC's for technique.   Transfers Overall transfer level: Needs assistance Equipment used: Rolling walker (2 wheeled) Transfers: Sit to/from Stand Sit to Stand: Min guard         General transfer comment: Min guard on rise to stand for safety. Once standing, able to ambulate with supervision this session.     Balance Overall balance assessment: Needs assistance Sitting-balance support: No upper extremity supported;Feet supported Sitting balance-Leahy Scale: Fair     Standing balance support: During functional activity;Bilateral upper extremity supported Standing balance-Leahy Scale: Fair Standing balance comment: Able to statically stand without RW briefly during ADL tasks.                            ADL either performed or assessed with clinical judgement   ADL Overall ADL's : Needs assistance/impaired     Grooming: Supervision/safety;Standing                   Toilet Transfer: Supervision/safety;Ambulation;RW       Tub/ Shower Transfer: Minimal assistance;Cueing for safety;Shower seat;Rolling walker   Functional mobility during ADLs: Rolling walker;Supervision/safety General ADL Comments: Educated pt on safe tub transfers.      Vision       Perception     Praxis      Cognition Arousal/Alertness: Awake/alert Behavior During Therapy: WFL for tasks assessed/performed Overall Cognitive Status: Within Functional Limits for tasks assessed  Exercises     Shoulder Instructions       General Comments      Pertinent Vitals/ Pain       Pain Assessment: Faces Faces Pain Scale: Hurts little more Pain Location: back Pain Descriptors / Indicators: Aching;Sore;Operative site guarding Pain Intervention(s): Limited activity within patient's tolerance;Monitored during session;Premedicated before session  Home Living                                           Prior Functioning/Environment              Frequency  Min 2X/week        Progress Toward Goals  OT Goals(current goals can now be found in the care plan section)  Progress towards OT goals: Progressing toward goals  Acute Rehab OT Goals Patient Stated Goal: go back to work OT Goal Formulation: With patient Time For Goal Achievement: 06/21/17 Potential to Achieve Goals: Good  Plan Discharge plan remains appropriate    Co-evaluation                 AM-PAC PT "6 Clicks" Daily Activity     Outcome Measure   Help from another person eating meals?: None Help from another person taking care of personal grooming?: A Little Help from another person toileting, which includes using toliet, bedpan, or urinal?: A Little Help from another person bathing (including washing, rinsing, drying)?: A Lot Help from another person to put on and taking off regular upper body clothing?: A Little Help from another person to put on and taking off regular lower body clothing?: A Lot 6 Click Score: 17    End of Session Equipment Utilized During Treatment: Gait belt;Rolling walker;Back brace  OT Visit Diagnosis: Pain Pain - Right/Left:  (back) Pain - part of body:  (back)   Activity Tolerance Patient tolerated treatment well   Patient Left in chair;with call bell/phone within reach   Nurse Communication Mobility status        Time: 6060-0459 OT Time Calculation (min): 29 min  Charges: OT General Charges $OT Visit: 1 Procedure OT Treatments $Self Care/Home Management : 8-22 mins  Norman Herrlich, MS OTR/L  Pager: [place] A Rudine Rieger 06/15/2017, 4:10 PM

## 2017-06-16 LAB — GLUCOSE, CAPILLARY
GLUCOSE-CAPILLARY: 154 mg/dL — AB (ref 65–99)
Glucose-Capillary: 143 mg/dL — ABNORMAL HIGH (ref 65–99)

## 2017-06-16 MED ORDER — HYDROCODONE-ACETAMINOPHEN 10-325 MG PO TABS: 1.0000 | ORAL_TABLET | ORAL | 0 refills | Status: DC | PRN

## 2017-06-16 MED ORDER — DIAZEPAM 5 MG PO TABS: 5.0000 mg | ORAL_TABLET | Freq: Four times a day (QID) | ORAL | 0 refills | Status: DC | PRN

## 2017-06-16 MED FILL — Gelatin Absorbable Sponge Size 100: CUTANEOUS | Qty: 1 | Status: AC

## 2017-06-16 MED FILL — Thrombin For Soln 20000 Unit: CUTANEOUS | Qty: 1 | Status: AC

## 2017-06-16 NOTE — Discharge Instructions (Signed)

## 2017-06-16 NOTE — Progress Notes (Signed)
Physical Therapy Treatment Patient Details Name: Margaret Frye MRN: [id number] DOB: 1946/02/13 Today's Date: 06/16/2017    History of Present Illness 71 year old female with history of severe intractable back pain with intermittent radiation of both lower extremities. PMH includes anemia, arthritis, DM, depression, borderline glaucoma, asthma, HTN, HLD, TIA. pt s/p bil L2-5 dempression, PLIF and PLA with bone grafting.    PT Comments    Patient seen for mobility progression, tolerated ambulation and stair negotiation well. Patient remains guarded and slow with all movements. Encouraged frequent mobility at home, educated on car transfers and mobility expectations. Current POC remains appropriate.   Follow Up Recommendations  Home health PT     Equipment Recommendations  Rolling walker with 5" wheels    Recommendations for Other Services       Precautions / Restrictions Precautions Precautions: Back;Fall Precaution Booklet Issued: No Precaution Comments: pt able to recall 3/3 back precautions without cues, reminder cues to demo with mobility Required Braces or Orthoses: Spinal Brace Spinal Brace: Lumbar corset;Applied in sitting position Restrictions Weight Bearing Restrictions: No Other Position/Activity Restrictions: recommend keep HOB up as pt has breathing issues (supposed to wear O2 at night, feels like she's suffocating)    Mobility  Bed Mobility Overal bed mobility: Needs Assistance Bed Mobility: Rolling;Sit to Sidelying Rolling: Supervision       Sit to sidelying: Supervision General bed mobility comments: No physical assisst required, increased time to perform  Transfers Overall transfer level: Needs assistance Equipment used: Rolling walker (2 wheeled) Transfers: Sit to/from Stand Sit to Stand: Supervision         General transfer comment: Slow and guarded with standing, VCs for hand placement  Ambulation/Gait Ambulation/Gait assistance:  Supervision Ambulation Distance (Feet): 140 Feet Assistive device: Rolling walker (2 wheeled) Gait Pattern/deviations: Step-through pattern;Decreased stride length;Narrow base of support Gait velocity: decreased Gait velocity interpretation: Below normal speed for age/gender General Gait Details: slwo and guarded with gait, VCs for relaxation and increased cadence   Stairs Stairs: Yes   Stair Management: No rails;Forwards Number of Stairs: 2 General stair comments: VCs for sequencing  Wheelchair Mobility    Modified Rankin (Stroke Patients Only)       Balance Overall balance assessment: Needs assistance         Standing balance support: During functional activity;Bilateral upper extremity supported Standing balance-Leahy Scale: Fair Standing balance comment: able to let go of Rw for functional activity                            Cognition Arousal/Alertness: Awake/alert Behavior During Therapy: WFL for tasks assessed/performed Overall Cognitive Status: Within Functional Limits for tasks assessed                                        Exercises      General Comments        Pertinent Vitals/Pain Pain Assessment: Faces Faces Pain Scale: Hurts little more Pain Location: back Pain Descriptors / Indicators: Aching;Sore;Operative site guarding Pain Intervention(s): Monitored during session    Home Living                      Prior Function            PT Goals (current goals can now be found in the care plan section) Acute Rehab PT Goals Patient Stated  Goal: go back to work PT Goal Formulation: With patient Time For Goal Achievement: 06/20/17 Potential to Achieve Goals: Good Progress towards PT goals: Progressing toward goals    Frequency    Min 5X/week      PT Plan Discharge plan needs to be updated    Co-evaluation              AM-PAC PT "6 Clicks" Daily Activity  Outcome Measure  Difficulty turning  over in bed (including adjusting bedclothes, sheets and blankets)?: A Lot Difficulty moving from lying on back to sitting on the side of the bed? : A Lot Difficulty sitting down on and standing up from a chair with arms (e.g., wheelchair, bedside commode, etc,.)?: A Little Help needed moving to and from a bed to chair (including a wheelchair)?: A Little Help needed walking in hospital room?: A Little Help needed climbing 3-5 steps with a railing? : A Lot 6 Click Score: 15    End of Session Equipment Utilized During Treatment: Gait belt;Back brace Activity Tolerance: Patient tolerated treatment well;No increased pain Patient left: in bed;with call bell/phone within reach;with family/visitor present Nurse Communication: Mobility status PT Visit Diagnosis: Unsteadiness on feet (R26.81);Pain;Other abnormalities of gait and mobility (R26.89) Pain - Right/Left:  (center)     Time: 4098-11910907-0925 PT Time Calculation (min) (ACUTE ONLY): 18 min  Charges:  $Gait Training: 8-22 mins                    G Codes:       Charlotte Crumbevon Elo Marmolejos, PT DPT  Board Certified Neurologic Specialist [phone]    Fabio AsaDevon J Lyra Alaimo 06/16/2017, 9:25 AM

## 2017-06-16 NOTE — Discharge Summary (Signed)
Physician Discharge Summary  Patient ID: Margaret SkainsBetty G Teal MRN: [id number] DOB/AGE: 1946-09-02 71 y.o.  Admit date: 06/13/2017 Discharge date: 06/16/2017  Admission Diagnoses:  Discharge Diagnoses:  Active Problems:   Degenerative spondylolisthesis   Discharged Condition: good  Hospital Course: Patient did hospital where she underwent uncomplicated three-level lumbar decompression and fusion. Postoperative she is doing well. Preoperative back and lower extremity pain much improved. Standing and evaluating without difficulty. Ready for discharge home.  Consults:   Significant Diagnostic Studies:   Treatments:   Discharge Exam: Blood pressure (!) 117/52, pulse 71, temperature 98.5 F (36.9 C), temperature source Oral, resp. rate 18, height 5\' 3"  (1.6 m), weight 101 kg (222 lb 10.6 oz), SpO2 98 %. Awake and alert. Oriented and appropriate. Cranial nerve function intact. Motor sensory function extremities all. Wound clean and dry. Chest and abdomen benign.  Disposition:    Allergies as of 06/16/2017   No Known Allergies     Medication List    STOP taking these medications   HYDROcodone-acetaminophen 7.5-325 MG tablet Commonly known as:  NORCO Replaced by:  HYDROcodone-acetaminophen 10-325 MG tablet     TAKE these medications   aspirin 81 MG chewable tablet Chew 81 mg by mouth daily.   BLINK TEARS 0.25 % Soln Generic drug:  Polyethylene Glycol 400 Place 1-2 drops into both eyes 3 (three) times daily as needed (for dry eyes.).   diazepam 5 MG tablet Commonly known as:  VALIUM Take 1-2 tablets (5-10 mg total) by mouth every 6 (six) hours as needed for muscle spasms.   docusate sodium 100 MG capsule Commonly known as:  COLACE Take 100 mg by mouth daily.   DULoxetine 30 MG capsule Commonly known as:  CYMBALTA Take 60 mg by mouth daily.   ergocalciferol 50000 units capsule Commonly known as:  VITAMIN D2 Take 50,000 Units by mouth every Sunday.   eucerin cream Apply 1  application topically 5 (five) times daily as needed (for dry skin throughout day as needed for handwashing).   fluticasone 50 MCG/ACT nasal spray Commonly known as:  FLONASE Place 1-2 sprays into both nostrils daily as needed. For nasal congestion.   hydrochlorothiazide 25 MG tablet Commonly known as:  HYDRODIURIL Take 25 mg by mouth daily.   HYDROcodone-acetaminophen 10-325 MG tablet Commonly known as:  NORCO Take 1-2 tablets by mouth every 4 (four) hours as needed (breakthrough pain). Replaces:  HYDROcodone-acetaminophen 7.5-325 MG tablet   levocetirizine 5 MG tablet Commonly known as:  XYZAL Take 5 mg by mouth at bedtime.   levothyroxine 50 MCG tablet Commonly known as:  SYNTHROID, LEVOTHROID Take 50 mcg by mouth daily before breakfast.   metFORMIN 500 MG tablet Commonly known as:  GLUCOPHAGE Take 500 mg by mouth 2 (two) times daily.   metoprolol succinate 100 MG 24 hr tablet Commonly known as:  TOPROL-XL Take 100 mg by mouth 2 (two) times daily.   OMEGA 3 PO Take 450 mg by mouth daily. OMEGA XL 150 MG PER CAPSULE   omeprazole 40 MG capsule Commonly known as:  PRILOSEC Take 40 mg by mouth daily before breakfast.   Potassium 99 MG Tabs Take 99 mg by mouth at bedtime.   simvastatin 40 MG tablet Commonly known as:  ZOCOR Take 40 mg by mouth at bedtime.   traZODone 100 MG tablet Commonly known as:  DESYREL Take 100 mg by mouth at bedtime. 30 minutes before bedtime.   triamcinolone cream 0.1 % Commonly known as:  KENALOG Apply 1 application  topically 2 (two) times daily as needed (for psoriasis (typically once daily)).   Vitamin B-12 2500 MCG Subl Place 2,500 mcg under the tongue daily.            Durable Medical Equipment        Start     Ordered   06/13/17 1355  DME Walker rolling  Once    Question:  Patient needs a walker to treat with the following condition  Answer:  Degenerative spondylolisthesis   06/13/17 1355   06/13/17 1355  DME 3 n 1   Once     06/13/17 1355       Signed: Sabriah Hobbins A 06/16/2017, 7:56 AM

## 2017-06-16 NOTE — Progress Notes (Signed)
Discharge orders received.  Discharge instructions and follow-up appointments reviewed with the patient.  VSS upon discharge.  IV removed and education complete.  All belongings sent with the patient.  Husband at bedside.  Transported out via wheelchair.  Sondra ComeSilva, Millenia Waldvogel M, RN

## 2017-06-16 NOTE — Care Management Note (Signed)
Case Management Note  Patient Details  Name: Margaret Frye MRN: [id number] Date of Birth: 1946-08-14  Subjective/Objective:                    Action/Plan: Pt discharging home with orders for [facility] services. CM met with the patient and provided her a list of [facility] agencies. She selected [place]. Patient also requesting a hospital bed for home. Pt did not ask MD about bed this am.  CM reached out to Dr Annette Stable and he ordered a hospital bed for home. CM notified Santiago Glad with [facility] of hospital bed and [facility] services.  Pt with orders for walker and 3 in 1. Pt states she has this equipment at home.  Pt states her husband will provide transportation home. Bedside RN updated.  Expected Discharge Date:  06/16/17               Expected Discharge Plan:  [place]  In-House Referral:     Discharge planning Services  CM Consult  Post Acute Care Choice:  Durable Medical Equipment Choice offered to:  Patient  DME Arranged:  Hospital bed DME Agency:  [place]:  PT, OT [facility] Agency:  [place]  Status of Service:  Completed, signed off  If discussed at [place] of Stay Meetings, dates discussed:    Additional Comments:  Pollie Friar, RN 06/16/2017, 11:36 AM

## 2017-06-16 NOTE — Progress Notes (Signed)
Occupational Therapy Treatment Patient Details Name: Margaret Frye MRN: [id number] DOB: Jul 06, 1946 Today's Date: 06/16/2017    History of present illness 71 year old female with history of severe intractable back pain with intermittent radiation of both lower extremities. PMH includes anemia, arthritis, DM, depression, borderline glaucoma, asthma, HTN, HLD, TIA. pt s/p bil L2-5 dempression, PLIF and PLA with bone grafting.   OT comments  Pt demonstrating good progress toward OT goals. Educated on compensatory strategies for LB ADL and pt able to complete with overall min assist. She demonstrated improved standing balance to pull up LB clothing this session. Continued education concerning safe tub transfers and she was able to complete with overall min guard assist. All education complete and pt reports no further questions or concerns. No further acute OT needs identified. OT will sign off.    Follow Up Recommendations  DC plan and follow up therapy as arranged by surgeon;Supervision/Assistance - 24 hour    Equipment Recommendations  Other (comment) (AE kit)    Recommendations for Other Services      Precautions / Restrictions Precautions Precautions: Back;Fall Precaution Booklet Issued: No Precaution Comments: Pt demonstrates understanding of 3/3 back precautions.  Required Braces or Orthoses: Spinal Brace Spinal Brace: Lumbar corset;Applied in sitting position Restrictions Weight Bearing Restrictions: No Other Position/Activity Restrictions: recommend keep HOB up as pt has breathing issues (supposed to wear O2 at night, feels like she's suffocating)       Mobility Bed Mobility Overal bed mobility: Needs Assistance Bed Mobility: Rolling;Sidelying to Sit Rolling: Supervision Sidelying to sit: Supervision     Sit to sidelying: Supervision General bed mobility comments: Supervision for safety.   Transfers Overall transfer level: Needs assistance Equipment used: Rolling walker  (2 wheeled) Transfers: Sit to/from Stand Sit to Stand: Supervision         General transfer comment: Supervision for safety wtih initial VC's for hand placement.     Balance Overall balance assessment: Needs assistance Sitting-balance support: No upper extremity supported;Feet supported Sitting balance-Leahy Scale: Fair     Standing balance support: During functional activity;Bilateral upper extremity supported Standing balance-Leahy Scale: Fair Standing balance comment: able to let go of Rw for functional activity                           ADL either performed or assessed with clinical judgement   ADL Overall ADL's : Needs assistance/impaired                 Upper Body Dressing : Set up;Sitting   Lower Body Dressing: Minimal assistance;Sit to/from stand Lower Body Dressing Details (indicate cue type and reason): Able to pull up underwear in standing without physical assistance. Assist to thread feet into pants.  Toilet Transfer: Supervision/safety;Ambulation;RW       Tub/ Shower Transfer: Min guard;Cueing for safety;Cueing for sequencing;Shower seat;Ambulation   Functional mobility during ADLs: Rolling walker;Supervision/safety General ADL Comments: Educated pt concerning safe tub transfers with tub seat this session as only able to simulate on previous education.      Vision       Perception     Praxis      Cognition Arousal/Alertness: Awake/alert Behavior During Therapy: WFL for tasks assessed/performed Overall Cognitive Status: Within Functional Limits for tasks assessed  Exercises     Shoulder Instructions       General Comments      Pertinent Vitals/ Pain       Pain Assessment: Faces Faces Pain Scale: Hurts little more Pain Location: back Pain Descriptors / Indicators: Aching;Sore;Operative site guarding Pain Intervention(s): Monitored during session  Home Living                                           Prior Functioning/Environment              Frequency  Min 2X/week        Progress Toward Goals  OT Goals(current goals can now be found in the care plan section)  Progress towards OT goals: Progressing toward goals  Acute Rehab OT Goals Patient Stated Goal: go back to work OT Goal Formulation: With patient Time For Goal Achievement: 06/21/17 Potential to Achieve Goals: Good  Plan Discharge plan remains appropriate    Co-evaluation                 AM-PAC PT "6 Clicks" Daily Activity     Outcome Measure   Help from another person eating meals?: None Help from another person taking care of personal grooming?: Margaret Little Help from another person toileting, which includes using toliet, bedpan, or urinal?: Margaret Little Help from another person bathing (including washing, rinsing, drying)?: Margaret Little Help from another person to put on and taking off regular upper body clothing?: Margaret Little Help from another person to put on and taking off regular lower body clothing?: Margaret Little 6 Click Score: 19    End of Session Equipment Utilized During Treatment: Gait belt;Rolling walker;Back brace  OT Visit Diagnosis: Pain Pain - Right/Left:  (back) Pain - part of body:  (back)   Activity Tolerance Patient tolerated treatment well   Patient Left in chair;with call bell/phone within reach   Nurse Communication Mobility status        Time: 9485-4627 OT Time Calculation (min): 23 min  Charges: OT General Charges $OT Visit: 1 Procedure OT Treatments $Self Care/Home Management : 23-37 mins  Norman Herrlich, MS OTR/L  Pager: [place] Margaret Frye 06/16/2017, 9:33 AM

## 2017-06-16 NOTE — Anesthesia Postprocedure Evaluation (Signed)
Anesthesia Post Note  Patient: Margaret Frye  Procedure(s) Performed: Procedure(s) (LRB): Posterior Lumbar Interbody Fusion - Lumbar two-three, Lumbar three-four, Lumbar four-five (N/A)     Patient location during evaluation: PACU Anesthesia Type: General Level of consciousness: awake and alert Pain management: pain level controlled Vital Signs Assessment: post-procedure vital signs reviewed and stable Respiratory status: spontaneous breathing, nonlabored ventilation, respiratory function stable and patient connected to nasal cannula oxygen Cardiovascular status: blood pressure returned to baseline and stable Postop Assessment: no signs of nausea or vomiting Anesthetic complications: no    Last Vitals:  Vitals:   06/16/17 0116 06/16/17 0500  BP: 110/61 (!) 117/52  Pulse: 62 71  Resp: 15 18  Temp: 36.9 C 36.9 C    Last Pain:  Vitals:   06/16/17 0625  TempSrc:   PainSc: 3                  Hazem Kenner EDWARD

## 2017-06-17 MED FILL — Sodium Chloride IV Soln 0.9%: INTRAVENOUS | Qty: 1000 | Status: AC

## 2017-06-17 MED FILL — Heparin Sodium (Porcine) Inj 1000 Unit/ML: INTRAMUSCULAR | Qty: 30 | Status: AC

## 2017-06-17 NOTE — Consult Note (Signed)
           [facility] Primary Care Navigator  06/17/2017  Corey SkainsBetty G Frye Jul 31, 1946 [id number]   Attemptto see patientat the bedsideto identify possible discharge needs but she was alreadydischarged.  Patient was discharged home yesterday with home health services [facility] .  Primary care provider's office called (Connie)to notify of patient's discharge and needfor post hospital follow-up and transition of care. Reminded as well of patient's health issues needing follow-up.   Made aware to [facility] care management ifdeemed appropriateand necessary for further services at home.  For questions, please contact:  Wyatt HasteLorraine Niasia Lanphear, BSN, RN- [facility] Primary Care Navigator  Telephone: [phone] [facility]

## 2017-06-18 DIAGNOSIS — Z4789 Encounter for other orthopedic aftercare: Secondary | ICD-10-CM | POA: Diagnosis not present

## 2017-06-18 DIAGNOSIS — I10 Essential (primary) hypertension: Secondary | ICD-10-CM | POA: Diagnosis not present

## 2017-06-18 DIAGNOSIS — Z7984 Long term (current) use of oral hypoglycemic drugs: Secondary | ICD-10-CM | POA: Diagnosis not present

## 2017-06-18 DIAGNOSIS — E119 Type 2 diabetes mellitus without complications: Secondary | ICD-10-CM | POA: Diagnosis not present

## 2017-06-18 DIAGNOSIS — Z981 Arthrodesis status: Secondary | ICD-10-CM | POA: Diagnosis not present

## 2017-06-18 DIAGNOSIS — M4316 Spondylolisthesis, lumbar region: Secondary | ICD-10-CM | POA: Diagnosis not present

## 2017-06-18 DIAGNOSIS — Z7982 Long term (current) use of aspirin: Secondary | ICD-10-CM | POA: Diagnosis not present

## 2017-06-24 DIAGNOSIS — Z4789 Encounter for other orthopedic aftercare: Secondary | ICD-10-CM | POA: Diagnosis not present

## 2017-06-24 DIAGNOSIS — Z7984 Long term (current) use of oral hypoglycemic drugs: Secondary | ICD-10-CM | POA: Diagnosis not present

## 2017-06-24 DIAGNOSIS — E119 Type 2 diabetes mellitus without complications: Secondary | ICD-10-CM | POA: Diagnosis not present

## 2017-06-24 DIAGNOSIS — I10 Essential (primary) hypertension: Secondary | ICD-10-CM | POA: Diagnosis not present

## 2017-06-24 DIAGNOSIS — Z7982 Long term (current) use of aspirin: Secondary | ICD-10-CM | POA: Diagnosis not present

## 2017-06-24 DIAGNOSIS — M4316 Spondylolisthesis, lumbar region: Secondary | ICD-10-CM | POA: Diagnosis not present

## 2017-06-24 DIAGNOSIS — Z981 Arthrodesis status: Secondary | ICD-10-CM | POA: Diagnosis not present

## 2017-06-25 DIAGNOSIS — R109 Unspecified abdominal pain: Secondary | ICD-10-CM | POA: Diagnosis not present

## 2017-06-25 DIAGNOSIS — R5383 Other fatigue: Secondary | ICD-10-CM | POA: Diagnosis not present

## 2017-06-25 DIAGNOSIS — R509 Fever, unspecified: Secondary | ICD-10-CM | POA: Diagnosis not present

## 2017-06-30 DIAGNOSIS — E119 Type 2 diabetes mellitus without complications: Secondary | ICD-10-CM | POA: Diagnosis not present

## 2017-06-30 DIAGNOSIS — Z7982 Long term (current) use of aspirin: Secondary | ICD-10-CM | POA: Diagnosis not present

## 2017-06-30 DIAGNOSIS — Z4789 Encounter for other orthopedic aftercare: Secondary | ICD-10-CM | POA: Diagnosis not present

## 2017-06-30 DIAGNOSIS — I10 Essential (primary) hypertension: Secondary | ICD-10-CM | POA: Diagnosis not present

## 2017-06-30 DIAGNOSIS — M4316 Spondylolisthesis, lumbar region: Secondary | ICD-10-CM | POA: Diagnosis not present

## 2017-06-30 DIAGNOSIS — Z981 Arthrodesis status: Secondary | ICD-10-CM | POA: Diagnosis not present

## 2017-06-30 DIAGNOSIS — Z7984 Long term (current) use of oral hypoglycemic drugs: Secondary | ICD-10-CM | POA: Diagnosis not present

## 2017-07-10 DIAGNOSIS — E119 Type 2 diabetes mellitus without complications: Secondary | ICD-10-CM | POA: Diagnosis not present

## 2017-07-10 DIAGNOSIS — Z7984 Long term (current) use of oral hypoglycemic drugs: Secondary | ICD-10-CM | POA: Diagnosis not present

## 2017-07-10 DIAGNOSIS — M4316 Spondylolisthesis, lumbar region: Secondary | ICD-10-CM | POA: Diagnosis not present

## 2017-07-10 DIAGNOSIS — I10 Essential (primary) hypertension: Secondary | ICD-10-CM | POA: Diagnosis not present

## 2017-07-10 DIAGNOSIS — Z7982 Long term (current) use of aspirin: Secondary | ICD-10-CM | POA: Diagnosis not present

## 2017-07-10 DIAGNOSIS — Z4789 Encounter for other orthopedic aftercare: Secondary | ICD-10-CM | POA: Diagnosis not present

## 2017-07-10 DIAGNOSIS — Z981 Arthrodesis status: Secondary | ICD-10-CM | POA: Diagnosis not present

## 2017-07-17 DIAGNOSIS — Z981 Arthrodesis status: Secondary | ICD-10-CM | POA: Diagnosis not present

## 2017-07-17 DIAGNOSIS — M4310 Spondylolisthesis, site unspecified: Secondary | ICD-10-CM | POA: Diagnosis not present

## 2017-07-17 DIAGNOSIS — M4316 Spondylolisthesis, lumbar region: Secondary | ICD-10-CM | POA: Diagnosis not present

## 2017-07-17 DIAGNOSIS — Z4789 Encounter for other orthopedic aftercare: Secondary | ICD-10-CM | POA: Diagnosis not present

## 2017-07-17 DIAGNOSIS — E119 Type 2 diabetes mellitus without complications: Secondary | ICD-10-CM | POA: Diagnosis not present

## 2017-07-17 DIAGNOSIS — Z7984 Long term (current) use of oral hypoglycemic drugs: Secondary | ICD-10-CM | POA: Diagnosis not present

## 2017-07-17 DIAGNOSIS — I10 Essential (primary) hypertension: Secondary | ICD-10-CM | POA: Diagnosis not present

## 2017-07-17 DIAGNOSIS — Z7982 Long term (current) use of aspirin: Secondary | ICD-10-CM | POA: Diagnosis not present

## 2017-07-22 DIAGNOSIS — Z7982 Long term (current) use of aspirin: Secondary | ICD-10-CM | POA: Diagnosis not present

## 2017-07-22 DIAGNOSIS — Z981 Arthrodesis status: Secondary | ICD-10-CM | POA: Diagnosis not present

## 2017-07-22 DIAGNOSIS — Z7984 Long term (current) use of oral hypoglycemic drugs: Secondary | ICD-10-CM | POA: Diagnosis not present

## 2017-07-22 DIAGNOSIS — Z4789 Encounter for other orthopedic aftercare: Secondary | ICD-10-CM | POA: Diagnosis not present

## 2017-07-22 DIAGNOSIS — E119 Type 2 diabetes mellitus without complications: Secondary | ICD-10-CM | POA: Diagnosis not present

## 2017-07-22 DIAGNOSIS — M4316 Spondylolisthesis, lumbar region: Secondary | ICD-10-CM | POA: Diagnosis not present

## 2017-07-22 DIAGNOSIS — I10 Essential (primary) hypertension: Secondary | ICD-10-CM | POA: Diagnosis not present

## 2017-07-24 DIAGNOSIS — Z7982 Long term (current) use of aspirin: Secondary | ICD-10-CM | POA: Diagnosis not present

## 2017-07-24 DIAGNOSIS — Z981 Arthrodesis status: Secondary | ICD-10-CM | POA: Diagnosis not present

## 2017-07-24 DIAGNOSIS — M4316 Spondylolisthesis, lumbar region: Secondary | ICD-10-CM | POA: Diagnosis not present

## 2017-07-24 DIAGNOSIS — Z4789 Encounter for other orthopedic aftercare: Secondary | ICD-10-CM | POA: Diagnosis not present

## 2017-07-24 DIAGNOSIS — E119 Type 2 diabetes mellitus without complications: Secondary | ICD-10-CM | POA: Diagnosis not present

## 2017-07-24 DIAGNOSIS — Z7984 Long term (current) use of oral hypoglycemic drugs: Secondary | ICD-10-CM | POA: Diagnosis not present

## 2017-07-24 DIAGNOSIS — I10 Essential (primary) hypertension: Secondary | ICD-10-CM | POA: Diagnosis not present

## 2017-07-30 DIAGNOSIS — M48062 Spinal stenosis, lumbar region with neurogenic claudication: Secondary | ICD-10-CM | POA: Diagnosis not present

## 2017-08-05 DIAGNOSIS — E119 Type 2 diabetes mellitus without complications: Secondary | ICD-10-CM | POA: Diagnosis not present

## 2017-08-05 DIAGNOSIS — R739 Hyperglycemia, unspecified: Secondary | ICD-10-CM | POA: Diagnosis not present

## 2017-09-03 DIAGNOSIS — M48062 Spinal stenosis, lumbar region with neurogenic claudication: Secondary | ICD-10-CM | POA: Diagnosis not present

## 2017-09-22 DIAGNOSIS — R8290 Unspecified abnormal findings in urine: Secondary | ICD-10-CM | POA: Diagnosis not present

## 2017-09-22 DIAGNOSIS — E782 Mixed hyperlipidemia: Secondary | ICD-10-CM | POA: Diagnosis not present

## 2017-09-22 DIAGNOSIS — E119 Type 2 diabetes mellitus without complications: Secondary | ICD-10-CM | POA: Diagnosis not present

## 2017-09-22 DIAGNOSIS — I10 Essential (primary) hypertension: Secondary | ICD-10-CM | POA: Diagnosis not present

## 2017-09-22 DIAGNOSIS — E039 Hypothyroidism, unspecified: Secondary | ICD-10-CM | POA: Diagnosis not present

## 2017-09-22 DIAGNOSIS — Z79899 Other long term (current) drug therapy: Secondary | ICD-10-CM | POA: Diagnosis not present

## 2017-09-25 DIAGNOSIS — L400 Psoriasis vulgaris: Secondary | ICD-10-CM | POA: Diagnosis not present

## 2017-09-29 DIAGNOSIS — E119 Type 2 diabetes mellitus without complications: Secondary | ICD-10-CM | POA: Diagnosis not present

## 2017-09-29 DIAGNOSIS — M7989 Other specified soft tissue disorders: Secondary | ICD-10-CM | POA: Diagnosis not present

## 2017-09-29 DIAGNOSIS — E039 Hypothyroidism, unspecified: Secondary | ICD-10-CM | POA: Diagnosis not present

## 2017-09-29 DIAGNOSIS — E782 Mixed hyperlipidemia: Secondary | ICD-10-CM | POA: Diagnosis not present

## 2017-09-29 DIAGNOSIS — Z0000 Encounter for general adult medical examination without abnormal findings: Secondary | ICD-10-CM | POA: Diagnosis not present

## 2017-09-29 DIAGNOSIS — G894 Chronic pain syndrome: Secondary | ICD-10-CM | POA: Diagnosis not present

## 2017-09-29 DIAGNOSIS — I10 Essential (primary) hypertension: Secondary | ICD-10-CM | POA: Diagnosis not present

## 2017-09-29 DIAGNOSIS — D649 Anemia, unspecified: Secondary | ICD-10-CM | POA: Diagnosis not present

## 2017-09-29 DIAGNOSIS — Z79899 Other long term (current) drug therapy: Secondary | ICD-10-CM | POA: Diagnosis not present

## 2017-09-30 ENCOUNTER — Other Ambulatory Visit: Payer: Self-pay | Admitting: Internal Medicine

## 2017-09-30 DIAGNOSIS — M7989 Other specified soft tissue disorders: Secondary | ICD-10-CM

## 2017-10-06 ENCOUNTER — Ambulatory Visit
Admission: RE | Admit: 2017-10-06 | Discharge: 2017-10-06 | Disposition: A | Discharge status: Home / Self Care | Payer: PPO | Source: Ambulatory Visit | Attending: Internal Medicine | Admitting: Internal Medicine

## 2017-10-06 DIAGNOSIS — M7989 Other specified soft tissue disorders: Secondary | ICD-10-CM | POA: Diagnosis not present

## 2017-10-09 DIAGNOSIS — D649 Anemia, unspecified: Secondary | ICD-10-CM | POA: Diagnosis not present

## 2017-11-07 DIAGNOSIS — Z6836 Body mass index (BMI) 36.0-36.9, adult: Secondary | ICD-10-CM | POA: Diagnosis not present

## 2017-11-07 DIAGNOSIS — M48062 Spinal stenosis, lumbar region with neurogenic claudication: Secondary | ICD-10-CM | POA: Diagnosis not present

## 2017-11-26 DIAGNOSIS — M25551 Pain in right hip: Secondary | ICD-10-CM | POA: Diagnosis not present

## 2017-11-26 DIAGNOSIS — E119 Type 2 diabetes mellitus without complications: Secondary | ICD-10-CM | POA: Diagnosis not present

## 2017-11-26 DIAGNOSIS — G894 Chronic pain syndrome: Secondary | ICD-10-CM | POA: Diagnosis not present

## 2017-11-26 DIAGNOSIS — E039 Hypothyroidism, unspecified: Secondary | ICD-10-CM | POA: Diagnosis not present

## 2017-11-26 DIAGNOSIS — E782 Mixed hyperlipidemia: Secondary | ICD-10-CM | POA: Diagnosis not present

## 2017-11-26 DIAGNOSIS — I10 Essential (primary) hypertension: Secondary | ICD-10-CM | POA: Diagnosis not present

## 2017-11-26 DIAGNOSIS — M1611 Unilateral primary osteoarthritis, right hip: Secondary | ICD-10-CM | POA: Diagnosis not present

## 2017-11-26 DIAGNOSIS — Z79899 Other long term (current) drug therapy: Secondary | ICD-10-CM | POA: Diagnosis not present

## 2017-11-26 DIAGNOSIS — Z1231 Encounter for screening mammogram for malignant neoplasm of breast: Secondary | ICD-10-CM | POA: Diagnosis not present

## 2018-01-06 DIAGNOSIS — S161XXA Strain of muscle, fascia and tendon at neck level, initial encounter: Secondary | ICD-10-CM | POA: Diagnosis not present

## 2018-01-06 DIAGNOSIS — M47812 Spondylosis without myelopathy or radiculopathy, cervical region: Secondary | ICD-10-CM | POA: Diagnosis not present

## 2018-01-14 ENCOUNTER — Other Ambulatory Visit: Payer: Self-pay | Admitting: Internal Medicine

## 2018-01-14 DIAGNOSIS — Z1231 Encounter for screening mammogram for malignant neoplasm of breast: Secondary | ICD-10-CM

## 2018-01-27 ENCOUNTER — Ambulatory Visit
Admission: RE | Admit: 2018-01-27 | Discharge: 2018-01-27 | Disposition: A | Discharge status: Home / Self Care | Payer: PPO | Source: Ambulatory Visit | Attending: Internal Medicine | Admitting: Internal Medicine

## 2018-01-27 DIAGNOSIS — Z1231 Encounter for screening mammogram for malignant neoplasm of breast: Secondary | ICD-10-CM | POA: Insufficient documentation

## 2018-01-30 DIAGNOSIS — M1711 Unilateral primary osteoarthritis, right knee: Secondary | ICD-10-CM | POA: Diagnosis not present

## 2018-01-30 DIAGNOSIS — M25561 Pain in right knee: Secondary | ICD-10-CM | POA: Diagnosis not present

## 2018-02-23 DIAGNOSIS — E782 Mixed hyperlipidemia: Secondary | ICD-10-CM | POA: Diagnosis not present

## 2018-02-23 DIAGNOSIS — I10 Essential (primary) hypertension: Secondary | ICD-10-CM | POA: Diagnosis not present

## 2018-02-23 DIAGNOSIS — E119 Type 2 diabetes mellitus without complications: Secondary | ICD-10-CM | POA: Diagnosis not present

## 2018-02-23 DIAGNOSIS — E039 Hypothyroidism, unspecified: Secondary | ICD-10-CM | POA: Diagnosis not present

## 2018-02-23 DIAGNOSIS — Z79899 Other long term (current) drug therapy: Secondary | ICD-10-CM | POA: Diagnosis not present

## 2018-03-06 DIAGNOSIS — E782 Mixed hyperlipidemia: Secondary | ICD-10-CM | POA: Diagnosis not present

## 2018-03-06 DIAGNOSIS — I10 Essential (primary) hypertension: Secondary | ICD-10-CM | POA: Diagnosis not present

## 2018-03-06 DIAGNOSIS — E039 Hypothyroidism, unspecified: Secondary | ICD-10-CM | POA: Diagnosis not present

## 2018-03-06 DIAGNOSIS — E119 Type 2 diabetes mellitus without complications: Secondary | ICD-10-CM | POA: Diagnosis not present

## 2018-03-06 DIAGNOSIS — Z79899 Other long term (current) drug therapy: Secondary | ICD-10-CM | POA: Diagnosis not present

## 2018-03-25 DIAGNOSIS — L400 Psoriasis vulgaris: Secondary | ICD-10-CM | POA: Diagnosis not present

## 2018-06-25 DIAGNOSIS — G894 Chronic pain syndrome: Secondary | ICD-10-CM | POA: Diagnosis not present

## 2018-06-25 DIAGNOSIS — E039 Hypothyroidism, unspecified: Secondary | ICD-10-CM | POA: Diagnosis not present

## 2018-06-25 DIAGNOSIS — E782 Mixed hyperlipidemia: Secondary | ICD-10-CM | POA: Diagnosis not present

## 2018-06-25 DIAGNOSIS — E119 Type 2 diabetes mellitus without complications: Secondary | ICD-10-CM | POA: Diagnosis not present

## 2018-06-25 DIAGNOSIS — M1990 Unspecified osteoarthritis, unspecified site: Secondary | ICD-10-CM | POA: Diagnosis not present

## 2018-06-25 DIAGNOSIS — I10 Essential (primary) hypertension: Secondary | ICD-10-CM | POA: Diagnosis not present

## 2018-06-25 DIAGNOSIS — Z79899 Other long term (current) drug therapy: Secondary | ICD-10-CM | POA: Diagnosis not present

## 2018-08-18 DIAGNOSIS — E039 Hypothyroidism, unspecified: Secondary | ICD-10-CM | POA: Diagnosis not present

## 2018-08-18 DIAGNOSIS — E119 Type 2 diabetes mellitus without complications: Secondary | ICD-10-CM | POA: Diagnosis not present

## 2018-08-18 DIAGNOSIS — I10 Essential (primary) hypertension: Secondary | ICD-10-CM | POA: Diagnosis not present

## 2018-08-18 DIAGNOSIS — E782 Mixed hyperlipidemia: Secondary | ICD-10-CM | POA: Diagnosis not present

## 2018-08-18 DIAGNOSIS — Z79899 Other long term (current) drug therapy: Secondary | ICD-10-CM | POA: Diagnosis not present

## 2018-08-25 DIAGNOSIS — G894 Chronic pain syndrome: Secondary | ICD-10-CM | POA: Diagnosis not present

## 2018-08-25 DIAGNOSIS — E782 Mixed hyperlipidemia: Secondary | ICD-10-CM | POA: Diagnosis not present

## 2018-08-25 DIAGNOSIS — I10 Essential (primary) hypertension: Secondary | ICD-10-CM | POA: Diagnosis not present

## 2018-08-25 DIAGNOSIS — E039 Hypothyroidism, unspecified: Secondary | ICD-10-CM | POA: Diagnosis not present

## 2018-08-25 DIAGNOSIS — M1990 Unspecified osteoarthritis, unspecified site: Secondary | ICD-10-CM | POA: Diagnosis not present

## 2018-08-25 DIAGNOSIS — E119 Type 2 diabetes mellitus without complications: Secondary | ICD-10-CM | POA: Diagnosis not present

## 2018-11-11 DIAGNOSIS — L400 Psoriasis vulgaris: Secondary | ICD-10-CM | POA: Diagnosis not present

## 2018-12-01 DIAGNOSIS — M542 Cervicalgia: Secondary | ICD-10-CM | POA: Diagnosis not present

## 2018-12-01 DIAGNOSIS — Z1239 Encounter for other screening for malignant neoplasm of breast: Secondary | ICD-10-CM | POA: Diagnosis not present

## 2018-12-01 DIAGNOSIS — I10 Essential (primary) hypertension: Secondary | ICD-10-CM | POA: Diagnosis not present

## 2018-12-01 DIAGNOSIS — E039 Hypothyroidism, unspecified: Secondary | ICD-10-CM | POA: Diagnosis not present

## 2018-12-01 DIAGNOSIS — Z79899 Other long term (current) drug therapy: Secondary | ICD-10-CM | POA: Diagnosis not present

## 2018-12-01 DIAGNOSIS — Z1211 Encounter for screening for malignant neoplasm of colon: Secondary | ICD-10-CM | POA: Diagnosis not present

## 2018-12-01 DIAGNOSIS — E782 Mixed hyperlipidemia: Secondary | ICD-10-CM | POA: Diagnosis not present

## 2018-12-01 DIAGNOSIS — E119 Type 2 diabetes mellitus without complications: Secondary | ICD-10-CM | POA: Diagnosis not present

## 2018-12-09 DIAGNOSIS — Z1211 Encounter for screening for malignant neoplasm of colon: Secondary | ICD-10-CM | POA: Diagnosis not present

## 2018-12-31 DIAGNOSIS — E119 Type 2 diabetes mellitus without complications: Secondary | ICD-10-CM | POA: Diagnosis not present

## 2019-03-03 DIAGNOSIS — I10 Essential (primary) hypertension: Secondary | ICD-10-CM | POA: Diagnosis not present

## 2019-03-03 DIAGNOSIS — E782 Mixed hyperlipidemia: Secondary | ICD-10-CM | POA: Diagnosis not present

## 2019-03-03 DIAGNOSIS — E118 Type 2 diabetes mellitus with unspecified complications: Secondary | ICD-10-CM | POA: Diagnosis present

## 2019-03-03 DIAGNOSIS — R739 Hyperglycemia, unspecified: Secondary | ICD-10-CM | POA: Diagnosis not present

## 2019-03-03 DIAGNOSIS — E039 Hypothyroidism, unspecified: Secondary | ICD-10-CM | POA: Diagnosis not present

## 2019-03-03 DIAGNOSIS — I4819 Other persistent atrial fibrillation: Secondary | ICD-10-CM | POA: Diagnosis not present

## 2019-03-03 DIAGNOSIS — Z5181 Encounter for therapeutic drug level monitoring: Secondary | ICD-10-CM | POA: Diagnosis not present

## 2019-03-03 DIAGNOSIS — Z0000 Encounter for general adult medical examination without abnormal findings: Secondary | ICD-10-CM | POA: Diagnosis not present

## 2019-03-03 DIAGNOSIS — Z79899 Other long term (current) drug therapy: Secondary | ICD-10-CM | POA: Diagnosis not present

## 2019-03-03 DIAGNOSIS — F5104 Psychophysiologic insomnia: Secondary | ICD-10-CM | POA: Diagnosis not present

## 2019-03-03 DIAGNOSIS — G894 Chronic pain syndrome: Secondary | ICD-10-CM | POA: Diagnosis not present

## 2019-03-03 DIAGNOSIS — K219 Gastro-esophageal reflux disease without esophagitis: Secondary | ICD-10-CM | POA: Diagnosis not present

## 2019-04-07 DIAGNOSIS — E118 Type 2 diabetes mellitus with unspecified complications: Secondary | ICD-10-CM | POA: Diagnosis not present

## 2019-04-07 DIAGNOSIS — E039 Hypothyroidism, unspecified: Secondary | ICD-10-CM | POA: Diagnosis not present

## 2019-04-07 DIAGNOSIS — I10 Essential (primary) hypertension: Secondary | ICD-10-CM | POA: Diagnosis not present

## 2019-04-07 DIAGNOSIS — E782 Mixed hyperlipidemia: Secondary | ICD-10-CM | POA: Diagnosis not present

## 2019-04-07 DIAGNOSIS — Z79899 Other long term (current) drug therapy: Secondary | ICD-10-CM | POA: Diagnosis not present

## 2019-06-18 DIAGNOSIS — I10 Essential (primary) hypertension: Secondary | ICD-10-CM | POA: Diagnosis not present

## 2019-06-18 DIAGNOSIS — Z79899 Other long term (current) drug therapy: Secondary | ICD-10-CM | POA: Diagnosis not present

## 2019-06-18 DIAGNOSIS — M5416 Radiculopathy, lumbar region: Secondary | ICD-10-CM | POA: Diagnosis not present

## 2019-06-18 DIAGNOSIS — E782 Mixed hyperlipidemia: Secondary | ICD-10-CM | POA: Diagnosis not present

## 2019-06-18 DIAGNOSIS — E118 Type 2 diabetes mellitus with unspecified complications: Secondary | ICD-10-CM | POA: Diagnosis not present

## 2019-06-18 DIAGNOSIS — E039 Hypothyroidism, unspecified: Secondary | ICD-10-CM | POA: Diagnosis not present

## 2019-06-25 ENCOUNTER — Other Ambulatory Visit: Payer: Self-pay | Admitting: Physical Medicine and Rehabilitation

## 2019-06-25 DIAGNOSIS — M5416 Radiculopathy, lumbar region: Secondary | ICD-10-CM | POA: Diagnosis not present

## 2019-06-25 DIAGNOSIS — M5136 Other intervertebral disc degeneration, lumbar region: Secondary | ICD-10-CM | POA: Diagnosis not present

## 2019-06-25 DIAGNOSIS — M5441 Lumbago with sciatica, right side: Secondary | ICD-10-CM | POA: Diagnosis not present

## 2019-06-25 DIAGNOSIS — G8929 Other chronic pain: Secondary | ICD-10-CM | POA: Diagnosis not present

## 2019-06-25 DIAGNOSIS — M5442 Lumbago with sciatica, left side: Secondary | ICD-10-CM | POA: Diagnosis not present

## 2019-07-07 ENCOUNTER — Ambulatory Visit
Admission: RE | Admit: 2019-07-07 | Discharge: 2019-07-07 | Disposition: A | Discharge status: Home / Self Care | Payer: PPO | Source: Ambulatory Visit | Attending: Physical Medicine and Rehabilitation | Admitting: Physical Medicine and Rehabilitation

## 2019-07-07 ENCOUNTER — Other Ambulatory Visit: Payer: Self-pay

## 2019-07-07 DIAGNOSIS — M5416 Radiculopathy, lumbar region: Secondary | ICD-10-CM | POA: Insufficient documentation

## 2019-07-07 DIAGNOSIS — R200 Anesthesia of skin: Secondary | ICD-10-CM | POA: Diagnosis not present

## 2019-07-07 DIAGNOSIS — M47817 Spondylosis without myelopathy or radiculopathy, lumbosacral region: Secondary | ICD-10-CM | POA: Diagnosis not present

## 2019-07-07 DIAGNOSIS — M79661 Pain in right lower leg: Secondary | ICD-10-CM | POA: Diagnosis not present

## 2019-07-12 ENCOUNTER — Other Ambulatory Visit: Payer: Self-pay | Admitting: Internal Medicine

## 2019-07-12 DIAGNOSIS — Z1231 Encounter for screening mammogram for malignant neoplasm of breast: Secondary | ICD-10-CM

## 2019-07-23 ENCOUNTER — Ambulatory Visit
Admission: RE | Admit: 2019-07-23 | Discharge: 2019-07-23 | Disposition: A | Discharge status: Home / Self Care | Payer: PPO | Source: Ambulatory Visit | Attending: Internal Medicine | Admitting: Internal Medicine

## 2019-07-23 DIAGNOSIS — Z1231 Encounter for screening mammogram for malignant neoplasm of breast: Secondary | ICD-10-CM | POA: Diagnosis not present

## 2019-08-18 DIAGNOSIS — M25552 Pain in left hip: Secondary | ICD-10-CM | POA: Diagnosis not present

## 2019-08-18 DIAGNOSIS — G8929 Other chronic pain: Secondary | ICD-10-CM | POA: Diagnosis not present

## 2019-08-18 DIAGNOSIS — G5702 Lesion of sciatic nerve, left lower limb: Secondary | ICD-10-CM | POA: Diagnosis not present

## 2019-08-18 DIAGNOSIS — M7062 Trochanteric bursitis, left hip: Secondary | ICD-10-CM | POA: Diagnosis not present

## 2019-09-15 DIAGNOSIS — Z79899 Other long term (current) drug therapy: Secondary | ICD-10-CM | POA: Diagnosis not present

## 2019-09-15 DIAGNOSIS — I10 Essential (primary) hypertension: Secondary | ICD-10-CM | POA: Diagnosis not present

## 2019-09-15 DIAGNOSIS — E039 Hypothyroidism, unspecified: Secondary | ICD-10-CM | POA: Diagnosis not present

## 2019-09-15 DIAGNOSIS — E782 Mixed hyperlipidemia: Secondary | ICD-10-CM | POA: Diagnosis not present

## 2019-09-15 DIAGNOSIS — E118 Type 2 diabetes mellitus with unspecified complications: Secondary | ICD-10-CM | POA: Diagnosis not present

## 2019-09-22 DIAGNOSIS — E782 Mixed hyperlipidemia: Secondary | ICD-10-CM | POA: Diagnosis not present

## 2019-09-22 DIAGNOSIS — Z79899 Other long term (current) drug therapy: Secondary | ICD-10-CM | POA: Diagnosis not present

## 2019-09-22 DIAGNOSIS — E118 Type 2 diabetes mellitus with unspecified complications: Secondary | ICD-10-CM | POA: Diagnosis not present

## 2019-09-22 DIAGNOSIS — Z23 Encounter for immunization: Secondary | ICD-10-CM | POA: Diagnosis not present

## 2019-09-22 DIAGNOSIS — F5104 Psychophysiologic insomnia: Secondary | ICD-10-CM | POA: Diagnosis not present

## 2019-09-22 DIAGNOSIS — I10 Essential (primary) hypertension: Secondary | ICD-10-CM | POA: Diagnosis not present

## 2019-09-22 DIAGNOSIS — G894 Chronic pain syndrome: Secondary | ICD-10-CM | POA: Diagnosis not present

## 2019-09-22 DIAGNOSIS — E039 Hypothyroidism, unspecified: Secondary | ICD-10-CM | POA: Diagnosis not present

## 2019-12-16 DIAGNOSIS — E039 Hypothyroidism, unspecified: Secondary | ICD-10-CM | POA: Diagnosis not present

## 2019-12-16 DIAGNOSIS — Z79899 Other long term (current) drug therapy: Secondary | ICD-10-CM | POA: Diagnosis not present

## 2019-12-16 DIAGNOSIS — E118 Type 2 diabetes mellitus with unspecified complications: Secondary | ICD-10-CM | POA: Diagnosis not present

## 2019-12-16 DIAGNOSIS — I10 Essential (primary) hypertension: Secondary | ICD-10-CM | POA: Diagnosis not present

## 2019-12-16 DIAGNOSIS — E782 Mixed hyperlipidemia: Secondary | ICD-10-CM | POA: Diagnosis not present

## 2019-12-23 DIAGNOSIS — J309 Allergic rhinitis, unspecified: Secondary | ICD-10-CM | POA: Diagnosis not present

## 2019-12-23 DIAGNOSIS — E782 Mixed hyperlipidemia: Secondary | ICD-10-CM | POA: Diagnosis not present

## 2019-12-23 DIAGNOSIS — Z1211 Encounter for screening for malignant neoplasm of colon: Secondary | ICD-10-CM | POA: Diagnosis not present

## 2019-12-23 DIAGNOSIS — I10 Essential (primary) hypertension: Secondary | ICD-10-CM | POA: Diagnosis not present

## 2019-12-23 DIAGNOSIS — E118 Type 2 diabetes mellitus with unspecified complications: Secondary | ICD-10-CM | POA: Diagnosis not present

## 2019-12-23 DIAGNOSIS — Z79899 Other long term (current) drug therapy: Secondary | ICD-10-CM | POA: Diagnosis not present

## 2019-12-23 DIAGNOSIS — E039 Hypothyroidism, unspecified: Secondary | ICD-10-CM | POA: Diagnosis not present

## 2019-12-23 DIAGNOSIS — G894 Chronic pain syndrome: Secondary | ICD-10-CM | POA: Diagnosis not present

## 2020-01-03 DIAGNOSIS — E119 Type 2 diabetes mellitus without complications: Secondary | ICD-10-CM | POA: Diagnosis not present

## 2020-01-06 ENCOUNTER — Ambulatory Visit: Payer: PPO | Attending: Internal Medicine

## 2020-01-06 DIAGNOSIS — Z23 Encounter for immunization: Secondary | ICD-10-CM

## 2020-01-06 NOTE — Progress Notes (Signed)
   Covid-19 Vaccination Clinic  Name:  KIMYETTA FLOTT    MRN: [id number] DOB: [birth year]  01/06/2020  Ms. Garringer was observed post Covid-19 immunization for 15 minutes without incidence. She was provided with Vaccine Information Sheet and instruction to access the V-Safe system.   Ms. Heckert was instructed to call 911 with any severe reactions post vaccine: Marland Kitchen Difficulty breathing  . Swelling of your face and throat  . A fast heartbeat  . A bad rash all over your body  . Dizziness and weakness    Immunizations Administered    Name Date Dose VIS Date Route   Pfizer COVID-19 Vaccine 01/06/2020 10:00 AM 0.3 mL 11/05/2019 Intramuscular   Manufacturer: [employer], [employer]   Lot: [id number]   NDC: [address]-4

## 2020-02-01 ENCOUNTER — Ambulatory Visit: Payer: PPO | Attending: Internal Medicine

## 2020-02-01 DIAGNOSIS — Z23 Encounter for immunization: Secondary | ICD-10-CM

## 2020-02-01 NOTE — Progress Notes (Signed)
   Covid-19 Vaccination Clinic  Name:  Margaret Frye    MRN: [id number] DOB: December 14, 1945  02/01/2020  [name] was observed post Covid-19 immunization for 15 minutes without incident. She was provided with Vaccine Information Sheet and instruction to access the V-Safe system.   [name] was instructed to call 911 with any severe reactions post vaccine: Marland Kitchen Difficulty breathing  . Swelling of face and throat  . A fast heartbeat  . A bad rash all over body  . Dizziness and weakness   Immunizations Administered    Name Date Dose VIS Date Route   Pfizer COVID-19 Vaccine 02/01/2020  4:42 PM 0.3 mL 11/05/2019 Intramuscular   Manufacturer: [employer], [employer]   Lot: [id number]   NDC: [address]-2

## 2020-04-12 DIAGNOSIS — I10 Essential (primary) hypertension: Secondary | ICD-10-CM | POA: Diagnosis not present

## 2020-04-12 DIAGNOSIS — E039 Hypothyroidism, unspecified: Secondary | ICD-10-CM | POA: Diagnosis not present

## 2020-04-12 DIAGNOSIS — E118 Type 2 diabetes mellitus with unspecified complications: Secondary | ICD-10-CM | POA: Diagnosis not present

## 2020-04-12 DIAGNOSIS — Z79899 Other long term (current) drug therapy: Secondary | ICD-10-CM | POA: Diagnosis not present

## 2020-04-12 DIAGNOSIS — Z1211 Encounter for screening for malignant neoplasm of colon: Secondary | ICD-10-CM | POA: Diagnosis not present

## 2020-04-12 DIAGNOSIS — E782 Mixed hyperlipidemia: Secondary | ICD-10-CM | POA: Diagnosis not present

## 2020-04-19 ENCOUNTER — Other Ambulatory Visit: Payer: Self-pay | Admitting: Internal Medicine

## 2020-04-19 DIAGNOSIS — E039 Hypothyroidism, unspecified: Secondary | ICD-10-CM | POA: Diagnosis not present

## 2020-04-19 DIAGNOSIS — Z79899 Other long term (current) drug therapy: Secondary | ICD-10-CM | POA: Diagnosis not present

## 2020-04-19 DIAGNOSIS — E782 Mixed hyperlipidemia: Secondary | ICD-10-CM | POA: Diagnosis not present

## 2020-04-19 DIAGNOSIS — I10 Essential (primary) hypertension: Secondary | ICD-10-CM | POA: Diagnosis not present

## 2020-04-19 DIAGNOSIS — G894 Chronic pain syndrome: Secondary | ICD-10-CM | POA: Diagnosis not present

## 2020-04-19 DIAGNOSIS — Z1231 Encounter for screening mammogram for malignant neoplasm of breast: Secondary | ICD-10-CM

## 2020-04-19 DIAGNOSIS — E118 Type 2 diabetes mellitus with unspecified complications: Secondary | ICD-10-CM | POA: Diagnosis not present

## 2020-04-19 DIAGNOSIS — Z0000 Encounter for general adult medical examination without abnormal findings: Secondary | ICD-10-CM | POA: Diagnosis not present

## 2020-05-03 DIAGNOSIS — K921 Melena: Secondary | ICD-10-CM | POA: Diagnosis not present

## 2020-05-23 DIAGNOSIS — K219 Gastro-esophageal reflux disease without esophagitis: Secondary | ICD-10-CM | POA: Diagnosis not present

## 2020-05-23 DIAGNOSIS — R195 Other fecal abnormalities: Secondary | ICD-10-CM | POA: Diagnosis not present

## 2020-07-26 ENCOUNTER — Other Ambulatory Visit: Payer: Self-pay

## 2020-07-26 ENCOUNTER — Ambulatory Visit
Admission: RE | Admit: 2020-07-26 | Discharge: 2020-07-26 | Disposition: A | Discharge status: Home / Self Care | Payer: PPO | Source: Ambulatory Visit | Attending: Internal Medicine | Admitting: Internal Medicine

## 2020-07-26 DIAGNOSIS — Z1231 Encounter for screening mammogram for malignant neoplasm of breast: Secondary | ICD-10-CM | POA: Insufficient documentation

## 2020-08-03 DIAGNOSIS — E039 Hypothyroidism, unspecified: Secondary | ICD-10-CM | POA: Diagnosis not present

## 2020-08-03 DIAGNOSIS — E118 Type 2 diabetes mellitus with unspecified complications: Secondary | ICD-10-CM | POA: Diagnosis not present

## 2020-08-03 DIAGNOSIS — Z79899 Other long term (current) drug therapy: Secondary | ICD-10-CM | POA: Diagnosis not present

## 2020-08-07 DIAGNOSIS — G894 Chronic pain syndrome: Secondary | ICD-10-CM | POA: Diagnosis not present

## 2020-08-07 DIAGNOSIS — R002 Palpitations: Secondary | ICD-10-CM | POA: Diagnosis not present

## 2020-08-07 DIAGNOSIS — E118 Type 2 diabetes mellitus with unspecified complications: Secondary | ICD-10-CM | POA: Diagnosis not present

## 2020-08-07 DIAGNOSIS — Z79899 Other long term (current) drug therapy: Secondary | ICD-10-CM | POA: Diagnosis not present

## 2020-08-07 DIAGNOSIS — I10 Essential (primary) hypertension: Secondary | ICD-10-CM | POA: Diagnosis not present

## 2020-08-07 DIAGNOSIS — E039 Hypothyroidism, unspecified: Secondary | ICD-10-CM | POA: Diagnosis not present

## 2020-08-07 DIAGNOSIS — E782 Mixed hyperlipidemia: Secondary | ICD-10-CM | POA: Diagnosis not present

## 2020-08-23 DIAGNOSIS — E039 Hypothyroidism, unspecified: Secondary | ICD-10-CM | POA: Diagnosis not present

## 2020-08-23 DIAGNOSIS — R0602 Shortness of breath: Secondary | ICD-10-CM | POA: Diagnosis not present

## 2020-08-23 DIAGNOSIS — Z8673 Personal history of transient ischemic attack (TIA), and cerebral infarction without residual deficits: Secondary | ICD-10-CM | POA: Diagnosis not present

## 2020-08-23 DIAGNOSIS — R002 Palpitations: Secondary | ICD-10-CM | POA: Diagnosis not present

## 2020-08-23 DIAGNOSIS — E782 Mixed hyperlipidemia: Secondary | ICD-10-CM | POA: Diagnosis not present

## 2020-08-23 DIAGNOSIS — I10 Essential (primary) hypertension: Secondary | ICD-10-CM | POA: Diagnosis not present

## 2020-08-23 DIAGNOSIS — E118 Type 2 diabetes mellitus with unspecified complications: Secondary | ICD-10-CM | POA: Diagnosis not present

## 2020-09-07 DIAGNOSIS — Z8673 Personal history of transient ischemic attack (TIA), and cerebral infarction without residual deficits: Secondary | ICD-10-CM | POA: Diagnosis not present

## 2020-09-14 DIAGNOSIS — R002 Palpitations: Secondary | ICD-10-CM | POA: Diagnosis not present

## 2020-09-14 DIAGNOSIS — R0602 Shortness of breath: Secondary | ICD-10-CM | POA: Diagnosis not present

## 2020-09-26 DIAGNOSIS — R002 Palpitations: Secondary | ICD-10-CM | POA: Diagnosis not present

## 2020-09-26 DIAGNOSIS — E039 Hypothyroidism, unspecified: Secondary | ICD-10-CM | POA: Diagnosis not present

## 2020-09-26 DIAGNOSIS — I10 Essential (primary) hypertension: Secondary | ICD-10-CM | POA: Diagnosis not present

## 2020-09-26 DIAGNOSIS — E782 Mixed hyperlipidemia: Secondary | ICD-10-CM | POA: Diagnosis not present

## 2020-09-26 DIAGNOSIS — Z8673 Personal history of transient ischemic attack (TIA), and cerebral infarction without residual deficits: Secondary | ICD-10-CM | POA: Diagnosis not present

## 2020-09-26 DIAGNOSIS — E118 Type 2 diabetes mellitus with unspecified complications: Secondary | ICD-10-CM | POA: Diagnosis not present

## 2020-10-05 DIAGNOSIS — Z20822 Contact with and (suspected) exposure to covid-19: Secondary | ICD-10-CM | POA: Diagnosis not present

## 2020-10-05 DIAGNOSIS — Z03818 Encounter for observation for suspected exposure to other biological agents ruled out: Secondary | ICD-10-CM | POA: Diagnosis not present

## 2020-11-06 DIAGNOSIS — Z79899 Other long term (current) drug therapy: Secondary | ICD-10-CM | POA: Diagnosis not present

## 2020-11-06 DIAGNOSIS — E782 Mixed hyperlipidemia: Secondary | ICD-10-CM | POA: Diagnosis not present

## 2020-11-06 DIAGNOSIS — E118 Type 2 diabetes mellitus with unspecified complications: Secondary | ICD-10-CM | POA: Diagnosis not present

## 2020-11-06 DIAGNOSIS — I10 Essential (primary) hypertension: Secondary | ICD-10-CM | POA: Diagnosis not present

## 2020-11-06 DIAGNOSIS — E039 Hypothyroidism, unspecified: Secondary | ICD-10-CM | POA: Diagnosis not present

## 2020-11-07 DIAGNOSIS — E118 Type 2 diabetes mellitus with unspecified complications: Secondary | ICD-10-CM | POA: Diagnosis not present

## 2020-11-07 DIAGNOSIS — M5416 Radiculopathy, lumbar region: Secondary | ICD-10-CM | POA: Diagnosis not present

## 2020-11-07 DIAGNOSIS — I10 Essential (primary) hypertension: Secondary | ICD-10-CM | POA: Diagnosis not present

## 2020-11-07 DIAGNOSIS — E039 Hypothyroidism, unspecified: Secondary | ICD-10-CM | POA: Diagnosis not present

## 2020-11-07 DIAGNOSIS — G894 Chronic pain syndrome: Secondary | ICD-10-CM | POA: Diagnosis not present

## 2020-11-07 DIAGNOSIS — E782 Mixed hyperlipidemia: Secondary | ICD-10-CM | POA: Diagnosis not present

## 2021-01-03 DIAGNOSIS — H43813 Vitreous degeneration, bilateral: Secondary | ICD-10-CM | POA: Diagnosis not present

## 2021-02-21 DIAGNOSIS — G894 Chronic pain syndrome: Secondary | ICD-10-CM | POA: Diagnosis not present

## 2021-02-21 DIAGNOSIS — Z1211 Encounter for screening for malignant neoplasm of colon: Secondary | ICD-10-CM | POA: Diagnosis not present

## 2021-02-21 DIAGNOSIS — Z0000 Encounter for general adult medical examination without abnormal findings: Secondary | ICD-10-CM | POA: Diagnosis not present

## 2021-02-21 DIAGNOSIS — E039 Hypothyroidism, unspecified: Secondary | ICD-10-CM | POA: Diagnosis not present

## 2021-02-21 DIAGNOSIS — E782 Mixed hyperlipidemia: Secondary | ICD-10-CM | POA: Diagnosis not present

## 2021-02-21 DIAGNOSIS — Z79899 Other long term (current) drug therapy: Secondary | ICD-10-CM | POA: Diagnosis not present

## 2021-02-21 DIAGNOSIS — E118 Type 2 diabetes mellitus with unspecified complications: Secondary | ICD-10-CM | POA: Diagnosis not present

## 2021-02-21 DIAGNOSIS — I10 Essential (primary) hypertension: Secondary | ICD-10-CM | POA: Diagnosis not present

## 2021-03-16 DIAGNOSIS — J208 Acute bronchitis due to other specified organisms: Secondary | ICD-10-CM | POA: Diagnosis not present

## 2021-03-16 DIAGNOSIS — E118 Type 2 diabetes mellitus with unspecified complications: Secondary | ICD-10-CM | POA: Diagnosis not present

## 2021-03-16 DIAGNOSIS — U071 COVID-19: Secondary | ICD-10-CM | POA: Diagnosis not present

## 2021-04-19 DIAGNOSIS — I10 Essential (primary) hypertension: Secondary | ICD-10-CM | POA: Diagnosis not present

## 2021-04-19 DIAGNOSIS — E782 Mixed hyperlipidemia: Secondary | ICD-10-CM | POA: Diagnosis not present

## 2021-04-20 DIAGNOSIS — Z1211 Encounter for screening for malignant neoplasm of colon: Secondary | ICD-10-CM | POA: Diagnosis not present

## 2021-05-09 DIAGNOSIS — M1710 Unilateral primary osteoarthritis, unspecified knee: Secondary | ICD-10-CM | POA: Diagnosis not present

## 2021-05-09 DIAGNOSIS — M25561 Pain in right knee: Secondary | ICD-10-CM | POA: Diagnosis not present

## 2021-06-07 DIAGNOSIS — E118 Type 2 diabetes mellitus with unspecified complications: Secondary | ICD-10-CM | POA: Diagnosis not present

## 2021-06-07 DIAGNOSIS — Z1231 Encounter for screening mammogram for malignant neoplasm of breast: Secondary | ICD-10-CM | POA: Diagnosis not present

## 2021-06-07 DIAGNOSIS — Z79899 Other long term (current) drug therapy: Secondary | ICD-10-CM | POA: Diagnosis not present

## 2021-06-07 DIAGNOSIS — R002 Palpitations: Secondary | ICD-10-CM | POA: Diagnosis not present

## 2021-06-07 DIAGNOSIS — E782 Mixed hyperlipidemia: Secondary | ICD-10-CM | POA: Diagnosis not present

## 2021-06-07 DIAGNOSIS — G894 Chronic pain syndrome: Secondary | ICD-10-CM | POA: Diagnosis not present

## 2021-06-07 DIAGNOSIS — I10 Essential (primary) hypertension: Secondary | ICD-10-CM | POA: Diagnosis not present

## 2021-06-07 DIAGNOSIS — E559 Vitamin D deficiency, unspecified: Secondary | ICD-10-CM | POA: Diagnosis not present

## 2021-06-07 DIAGNOSIS — E538 Deficiency of other specified B group vitamins: Secondary | ICD-10-CM | POA: Diagnosis not present

## 2021-06-07 DIAGNOSIS — E039 Hypothyroidism, unspecified: Secondary | ICD-10-CM | POA: Diagnosis not present

## 2021-06-07 DIAGNOSIS — Z0000 Encounter for general adult medical examination without abnormal findings: Secondary | ICD-10-CM | POA: Diagnosis not present

## 2021-06-27 DIAGNOSIS — Z79899 Other long term (current) drug therapy: Secondary | ICD-10-CM | POA: Diagnosis not present

## 2021-06-27 DIAGNOSIS — E039 Hypothyroidism, unspecified: Secondary | ICD-10-CM | POA: Diagnosis not present

## 2021-06-27 DIAGNOSIS — I10 Essential (primary) hypertension: Secondary | ICD-10-CM | POA: Diagnosis not present

## 2021-06-27 DIAGNOSIS — E559 Vitamin D deficiency, unspecified: Secondary | ICD-10-CM | POA: Diagnosis not present

## 2021-06-27 DIAGNOSIS — E538 Deficiency of other specified B group vitamins: Secondary | ICD-10-CM | POA: Diagnosis not present

## 2021-06-27 DIAGNOSIS — R002 Palpitations: Secondary | ICD-10-CM | POA: Diagnosis not present

## 2021-06-27 DIAGNOSIS — E782 Mixed hyperlipidemia: Secondary | ICD-10-CM | POA: Diagnosis not present

## 2021-06-27 DIAGNOSIS — E118 Type 2 diabetes mellitus with unspecified complications: Secondary | ICD-10-CM | POA: Diagnosis not present

## 2021-08-30 ENCOUNTER — Other Ambulatory Visit: Payer: Self-pay | Admitting: Internal Medicine

## 2021-08-30 DIAGNOSIS — Z1231 Encounter for screening mammogram for malignant neoplasm of breast: Secondary | ICD-10-CM

## 2021-09-11 DIAGNOSIS — E118 Type 2 diabetes mellitus with unspecified complications: Secondary | ICD-10-CM | POA: Diagnosis not present

## 2021-09-11 DIAGNOSIS — E559 Vitamin D deficiency, unspecified: Secondary | ICD-10-CM | POA: Diagnosis not present

## 2021-09-11 DIAGNOSIS — I10 Essential (primary) hypertension: Secondary | ICD-10-CM | POA: Diagnosis not present

## 2021-09-11 DIAGNOSIS — Z79899 Other long term (current) drug therapy: Secondary | ICD-10-CM | POA: Diagnosis not present

## 2021-09-11 DIAGNOSIS — E039 Hypothyroidism, unspecified: Secondary | ICD-10-CM | POA: Diagnosis not present

## 2021-09-11 DIAGNOSIS — Z23 Encounter for immunization: Secondary | ICD-10-CM | POA: Diagnosis not present

## 2021-09-11 DIAGNOSIS — E7800 Pure hypercholesterolemia, unspecified: Secondary | ICD-10-CM | POA: Diagnosis not present

## 2021-09-11 DIAGNOSIS — E538 Deficiency of other specified B group vitamins: Secondary | ICD-10-CM | POA: Diagnosis not present

## 2021-09-18 ENCOUNTER — Emergency Department: Admission: EM | Admit: 2021-09-18 | Discharge: 2021-09-18 | Discharge status: Against Medical Advice | Payer: HMO

## 2021-09-18 NOTE — ED Notes (Signed)
No answer when called several times from room  

## 2021-09-23 ENCOUNTER — Emergency Department: Payer: HMO

## 2021-09-23 ENCOUNTER — Inpatient Hospital Stay
Admission: EM | Admit: 2021-09-23 | Discharge: 2021-10-01 | DRG: 552 | Disposition: A | Discharge status: Home / Self Care | Payer: HMO | Attending: Internal Medicine | Admitting: Internal Medicine

## 2021-09-23 ENCOUNTER — Other Ambulatory Visit: Payer: Self-pay

## 2021-09-23 DIAGNOSIS — G8929 Other chronic pain: Secondary | ICD-10-CM | POA: Diagnosis not present

## 2021-09-23 DIAGNOSIS — Z90711 Acquired absence of uterus with remaining cervical stump: Secondary | ICD-10-CM

## 2021-09-23 DIAGNOSIS — I10 Essential (primary) hypertension: Secondary | ICD-10-CM | POA: Diagnosis present

## 2021-09-23 DIAGNOSIS — Z9889 Other specified postprocedural states: Secondary | ICD-10-CM

## 2021-09-23 DIAGNOSIS — K5903 Drug induced constipation: Secondary | ICD-10-CM | POA: Diagnosis not present

## 2021-09-23 DIAGNOSIS — M5116 Intervertebral disc disorders with radiculopathy, lumbar region: Principal | ICD-10-CM | POA: Diagnosis present

## 2021-09-23 DIAGNOSIS — M5126 Other intervertebral disc displacement, lumbar region: Secondary | ICD-10-CM | POA: Diagnosis not present

## 2021-09-23 DIAGNOSIS — Z9841 Cataract extraction status, right eye: Secondary | ICD-10-CM

## 2021-09-23 DIAGNOSIS — F1190 Opioid use, unspecified, uncomplicated: Secondary | ICD-10-CM | POA: Diagnosis not present

## 2021-09-23 DIAGNOSIS — E785 Hyperlipidemia, unspecified: Secondary | ICD-10-CM | POA: Diagnosis present

## 2021-09-23 DIAGNOSIS — T40605A Adverse effect of unspecified narcotics, initial encounter: Secondary | ICD-10-CM | POA: Diagnosis not present

## 2021-09-23 DIAGNOSIS — E119 Type 2 diabetes mellitus without complications: Secondary | ICD-10-CM | POA: Diagnosis not present

## 2021-09-23 DIAGNOSIS — Z20822 Contact with and (suspected) exposure to covid-19: Secondary | ICD-10-CM | POA: Diagnosis not present

## 2021-09-23 DIAGNOSIS — Z83511 Family history of glaucoma: Secondary | ICD-10-CM

## 2021-09-23 DIAGNOSIS — G894 Chronic pain syndrome: Secondary | ICD-10-CM | POA: Diagnosis present

## 2021-09-23 DIAGNOSIS — G4700 Insomnia, unspecified: Secondary | ICD-10-CM | POA: Diagnosis not present

## 2021-09-23 DIAGNOSIS — Z7982 Long term (current) use of aspirin: Secondary | ICD-10-CM | POA: Diagnosis not present

## 2021-09-23 DIAGNOSIS — Z79899 Other long term (current) drug therapy: Secondary | ICD-10-CM | POA: Diagnosis not present

## 2021-09-23 DIAGNOSIS — Z981 Arthrodesis status: Secondary | ICD-10-CM

## 2021-09-23 DIAGNOSIS — Z833 Family history of diabetes mellitus: Secondary | ICD-10-CM

## 2021-09-23 DIAGNOSIS — Z8049 Family history of malignant neoplasm of other genital organs: Secondary | ICD-10-CM | POA: Diagnosis not present

## 2021-09-23 DIAGNOSIS — G4730 Sleep apnea, unspecified: Secondary | ICD-10-CM | POA: Diagnosis present

## 2021-09-23 DIAGNOSIS — Z79891 Long term (current) use of opiate analgesic: Secondary | ICD-10-CM | POA: Diagnosis not present

## 2021-09-23 DIAGNOSIS — E538 Deficiency of other specified B group vitamins: Secondary | ICD-10-CM | POA: Diagnosis present

## 2021-09-23 DIAGNOSIS — M5459 Other low back pain: Secondary | ICD-10-CM | POA: Diagnosis not present

## 2021-09-23 DIAGNOSIS — M5442 Lumbago with sciatica, left side: Secondary | ICD-10-CM

## 2021-09-23 DIAGNOSIS — Z8249 Family history of ischemic heart disease and other diseases of the circulatory system: Secondary | ICD-10-CM

## 2021-09-23 DIAGNOSIS — Y92239 Unspecified place in hospital as the place of occurrence of the external cause: Secondary | ICD-10-CM | POA: Diagnosis not present

## 2021-09-23 DIAGNOSIS — Z8673 Personal history of transient ischemic attack (TIA), and cerebral infarction without residual deficits: Secondary | ICD-10-CM

## 2021-09-23 DIAGNOSIS — E559 Vitamin D deficiency, unspecified: Secondary | ICD-10-CM | POA: Diagnosis present

## 2021-09-23 DIAGNOSIS — Z79890 Hormone replacement therapy (postmenopausal): Secondary | ICD-10-CM | POA: Diagnosis not present

## 2021-09-23 DIAGNOSIS — M549 Dorsalgia, unspecified: Secondary | ICD-10-CM | POA: Diagnosis not present

## 2021-09-23 DIAGNOSIS — M25552 Pain in left hip: Secondary | ICD-10-CM | POA: Diagnosis not present

## 2021-09-23 DIAGNOSIS — Z961 Presence of intraocular lens: Secondary | ICD-10-CM | POA: Diagnosis present

## 2021-09-23 DIAGNOSIS — M545 Low back pain, unspecified: Secondary | ICD-10-CM | POA: Diagnosis not present

## 2021-09-23 DIAGNOSIS — E118 Type 2 diabetes mellitus with unspecified complications: Secondary | ICD-10-CM | POA: Diagnosis not present

## 2021-09-23 DIAGNOSIS — M170 Bilateral primary osteoarthritis of knee: Secondary | ICD-10-CM | POA: Diagnosis present

## 2021-09-23 DIAGNOSIS — Z9842 Cataract extraction status, left eye: Secondary | ICD-10-CM

## 2021-09-23 DIAGNOSIS — R531 Weakness: Secondary | ICD-10-CM | POA: Diagnosis not present

## 2021-09-23 DIAGNOSIS — K219 Gastro-esophageal reflux disease without esophagitis: Secondary | ICD-10-CM | POA: Diagnosis not present

## 2021-09-23 DIAGNOSIS — Z7984 Long term (current) use of oral hypoglycemic drugs: Secondary | ICD-10-CM | POA: Diagnosis not present

## 2021-09-23 DIAGNOSIS — Z803 Family history of malignant neoplasm of breast: Secondary | ICD-10-CM | POA: Diagnosis not present

## 2021-09-23 DIAGNOSIS — E039 Hypothyroidism, unspecified: Secondary | ICD-10-CM | POA: Diagnosis present

## 2021-09-23 LAB — URINALYSIS, ROUTINE W REFLEX MICROSCOPIC
Bilirubin Urine: NEGATIVE
Glucose, UA: 50 mg/dL — AB
Hgb urine dipstick: NEGATIVE
Ketones, ur: NEGATIVE mg/dL
Leukocytes,Ua: NEGATIVE
Nitrite: NEGATIVE
Protein, ur: NEGATIVE mg/dL
Specific Gravity, Urine: 1.012 (ref 1.005–1.030)
pH: 5 (ref 5.0–8.0)

## 2021-09-23 LAB — RESP PANEL BY RT-PCR (FLU A&B, COVID) ARPGX2
Influenza A by PCR: NEGATIVE
Influenza B by PCR: NEGATIVE
SARS Coronavirus 2 by RT PCR: NEGATIVE

## 2021-09-23 LAB — CBG MONITORING, ED
Glucose-Capillary: 120 mg/dL — ABNORMAL HIGH (ref 70–99)
Glucose-Capillary: 133 mg/dL — ABNORMAL HIGH (ref 70–99)

## 2021-09-23 MED ORDER — ACETAMINOPHEN 650 MG RE SUPP: 650.0000 mg | Freq: Four times a day (QID) | RECTAL | Status: DC | PRN

## 2021-09-23 MED ORDER — ONDANSETRON HCL 4 MG/2ML IJ SOLN: 4.0000 mg | Freq: Four times a day (QID) | INTRAMUSCULAR | Status: DC | PRN

## 2021-09-23 MED ORDER — HYDROMORPHONE HCL 1 MG/ML IJ SOLN
1.0000 mg | INTRAMUSCULAR | Status: DC | PRN
  Administered 2021-09-24 – 2021-09-30 (×28): 1 mg via INTRAVENOUS
  Filled 2021-09-23 (×28): qty 1

## 2021-09-23 MED ORDER — SIMVASTATIN 20 MG PO TABS
40.0000 mg | ORAL_TABLET | Freq: Every day | ORAL | Status: DC
  Administered 2021-09-23 – 2021-09-30 (×8): 40 mg via ORAL
  Filled 2021-09-23 (×5): qty 2
  Filled 2021-09-23: qty 4
  Filled 2021-09-23 (×2): qty 2

## 2021-09-23 MED ORDER — LEVOTHYROXINE SODIUM 50 MCG PO TABS
50.0000 ug | ORAL_TABLET | Freq: Every day | ORAL | Status: DC
  Administered 2021-09-24 – 2021-10-01 (×8): 50 ug via ORAL
  Filled 2021-09-23 (×8): qty 1

## 2021-09-23 MED ORDER — HYDROCHLOROTHIAZIDE 25 MG PO TABS
25.0000 mg | ORAL_TABLET | Freq: Every day | ORAL | Status: DC
  Administered 2021-09-23 – 2021-10-01 (×9): 25 mg via ORAL
  Filled 2021-09-23 (×9): qty 1

## 2021-09-23 MED ORDER — ACETAMINOPHEN 325 MG PO TABS
650.0000 mg | ORAL_TABLET | Freq: Four times a day (QID) | ORAL | Status: DC | PRN
  Administered 2021-09-24: 650 mg via ORAL
  Filled 2021-09-23: qty 2

## 2021-09-23 MED ORDER — HYDROMORPHONE HCL 1 MG/ML IJ SOLN
1.0000 mg | Freq: Once | INTRAMUSCULAR | Status: AC
  Administered 2021-09-23: 1 mg via INTRAVENOUS
  Filled 2021-09-23: qty 1

## 2021-09-23 MED ORDER — ASPIRIN 81 MG PO CHEW
81.0000 mg | CHEWABLE_TABLET | Freq: Every day | ORAL | Status: DC
  Administered 2021-09-23 – 2021-10-01 (×9): 81 mg via ORAL
  Filled 2021-09-23 (×9): qty 1

## 2021-09-23 MED ORDER — DIAZEPAM 5 MG PO TABS
5.0000 mg | ORAL_TABLET | Freq: Four times a day (QID) | ORAL | Status: DC | PRN
  Administered 2021-09-23 – 2021-10-01 (×12): 5 mg via ORAL
  Filled 2021-09-23 (×12): qty 1

## 2021-09-23 MED ORDER — INSULIN ASPART 100 UNIT/ML IJ SOLN
0.0000 [IU] | INTRAMUSCULAR | Status: DC
  Administered 2021-09-23: 2 [IU] via SUBCUTANEOUS
  Administered 2021-09-24: 3 [IU] via SUBCUTANEOUS
  Filled 2021-09-23 (×2): qty 1

## 2021-09-23 MED ORDER — DEXAMETHASONE SODIUM PHOSPHATE 10 MG/ML IJ SOLN
4.0000 mg | INTRAMUSCULAR | Status: DC
  Administered 2021-09-24 – 2021-09-25 (×7): 4 mg via INTRAVENOUS
  Filled 2021-09-23 (×2): qty 0.4
  Filled 2021-09-23: qty 1
  Filled 2021-09-23: qty 0.4
  Filled 2021-09-23: qty 1
  Filled 2021-09-23: qty 0.4
  Filled 2021-09-23 (×2): qty 1
  Filled 2021-09-23: qty 0.4

## 2021-09-23 MED ORDER — HYDROMORPHONE HCL 1 MG/ML IJ SOLN
2.0000 mg | Freq: Once | INTRAMUSCULAR | Status: DC
Start: 2021-09-23 — End: 2021-09-23

## 2021-09-23 MED ORDER — DEXAMETHASONE SODIUM PHOSPHATE 10 MG/ML IJ SOLN
6.0000 mg | Freq: Once | INTRAMUSCULAR | Status: AC
  Administered 2021-09-23: 6 mg via INTRAVENOUS
  Filled 2021-09-23: qty 1

## 2021-09-23 MED ORDER — KETAMINE HCL 50 MG/5ML IJ SOSY
20.0000 mg | PREFILLED_SYRINGE | Freq: Once | INTRAMUSCULAR | Status: AC
  Administered 2021-09-23: 20 mg via INTRAVENOUS
  Filled 2021-09-23: qty 5

## 2021-09-23 MED ORDER — ONDANSETRON HCL 4 MG PO TABS: 4.0000 mg | ORAL_TABLET | Freq: Four times a day (QID) | ORAL | Status: DC | PRN

## 2021-09-23 MED ORDER — ONDANSETRON 4 MG PO TBDP
8.0000 mg | ORAL_TABLET | Freq: Once | ORAL | Status: AC
  Administered 2021-09-23: 8 mg via ORAL
  Filled 2021-09-23: qty 2

## 2021-09-23 MED ORDER — FENTANYL CITRATE PF 50 MCG/ML IJ SOSY
50.0000 ug | PREFILLED_SYRINGE | Freq: Once | INTRAMUSCULAR | Status: AC
  Administered 2021-09-23: 50 ug via INTRAMUSCULAR
  Filled 2021-09-23: qty 1

## 2021-09-23 MED ORDER — ONDANSETRON HCL 4 MG/2ML IJ SOLN
4.0000 mg | Freq: Once | INTRAMUSCULAR | Status: AC
  Administered 2021-09-23: 4 mg via INTRAVENOUS
  Filled 2021-09-23: qty 2

## 2021-09-23 NOTE — ED Notes (Signed)
Patient brought back by MRI.

## 2021-09-23 NOTE — ED Notes (Signed)
Patient was placed on Purewick per request.

## 2021-09-23 NOTE — ED Notes (Signed)
Patient transported to MRI 

## 2021-09-23 NOTE — ED Provider Notes (Signed)
Emergency Medicine Provider Triage Evaluation Note  Margaret Frye , a 75 y.o. female  was evaluated in triage.  Pt complains of severe lower back pain.  Patient does have a history of lumbar fusion, states that she was walking several days ago and had a sudden sharp pain develop in the left lower back and left hip.  She states that she has intense pain radiating from her back into her left groin.  No urinary changes.  No abdominal pain.  Patient has been taking prescribed pain medication and muscle relaxer without relief of symptoms.  No loss of sensation in her lower extremities.  Pain does radiate down her lower left leg..  Review of Systems  Positive: Sharp left lower back pain and left hip pain Negative: Direct trauma, urinary symptoms, GI symptoms  Physical Exam  Temp 98.2 F (36.8 C) (Oral)  Gen:   Awake, no distress   Resp:  Normal effort  MSK:   No visible abnormality to the left hip or lower back.  Sharp pain reported mid lumbar spine and is both midline and left paraspinal tenderness on exam.  This extends into the SI joint, sciatic notch, lateral left hip, left inguinal region.  No palpable abnormalities.  No shortening or rotation of the lower extremity.  Sensation intact in all dermatomal distributions bilateral lower extremities.  Pulses intact bilateral lower extremities. Other:    Medical Decision Making  Medically screening exam initiated at 2:39 PM.  Appropriate orders placed.  Margaret Frye was informed that the remainder of the evaluation will be completed by another provider, this initial triage assessment does not replace that evaluation, and the importance of remaining in the ED until their evaluation is complete.  Sharp back pain.  Patient has a history of of lumbar spine issues and has had fusion in the past.  Patient has been taking her prescribed medications with no relief.  Severe pain in the lumbar spine and left hip.  Patient will have initial x-rays, pain medication.      Margaret Patches, PA-C 09/23/21 1442    Chesley Noon, MD 09/23/21 [phone]

## 2021-09-23 NOTE — ED Triage Notes (Addendum)
Pt states c/o lower back pain with numbness and pain to the groin area and inner thighs since Tuesday morning, pt has a hx of lumbar surgery in the past, denies injury..states her PCP called in pain meds that are not helping Pt took hydrocodone around 10am and muscle relaxer today, is also taking prednisone

## 2021-09-23 NOTE — ED Provider Notes (Signed)
[facility] Emergency Department Provider Note  ____________________________________________  Time seen: Approximately 3:23 PM  I have reviewed the triage vital signs and the nursing notes.   HISTORY  Chief Complaint Back Pain    HPI Margaret Frye is a 75 y.o. female who presents the emergency department complaining of severe left lower back and left hip pain.  Patient was walking a few days ago, felt a sudden sharp pain and has had worsening pain in both her back and hip since.  No shortening or rotation.  She can still ambulate but is in significant amounts of pain at this time.  She has been taking pain medication and muscle relaxer prescribed to her by her primary care with no relief.  She does have pain radiating down the left leg.  There is no loss of sensation.  No bowel or bladder dysfunction no descriptions of saddle anesthesia.  Patient was seen by myself in triage with initial orders placed.  No fevers or chills.  No URI, GI, urinary symptoms       Past Medical History:  Diagnosis Date   Anemia    Arthritis    Asthma    Borderline glaucoma    Depression    Diabetes mellitus without complication (HCC)    Edema    Elevated liver enzymes    Environmental allergies    Family history of adverse reaction to anesthesia    Daughter had PONV   GERD (gastroesophageal reflux disease)    Headache    migraine   Heart palpitations    History of hiatal hernia    Hyperlipidemia    Hypertension    Hypothyroidism    Lumbar stenosis    PONV (postoperative nausea and vomiting)    Sleep apnea    does not wear CPAP " my oxygen level drops when I sleep"   TIA (transient ischemic attack)    Vitamin B deficiency    Vitamin D deficiency    Wears glasses    Wears partial dentures     Patient Active Problem List   Diagnosis Date Noted   Degenerative spondylolisthesis 06/13/2017    Past Surgical History:  Procedure Laterality Date   ABDOMINAL  HYSTERECTOMY      " partial"   anterior cevical fusion     APPENDECTOMY     BACK SURGERY     lumbar laminectomy   BREAST EXCISIONAL BIOPSY Left 2000   benign   CATARACT EXTRACTION W/ INTRAOCULAR LENS  IMPLANT, BILATERAL     CHOLECYSTECTOMY     COLONOSCOPY W/ BIOPSIES     MULTIPLE TOOTH EXTRACTIONS     TUBAL LIGATION      Prior to Admission medications   Medication Sig Start Date End Date Taking? Authorizing Provider  aspirin 81 MG chewable tablet Chew 81 mg by mouth daily.    [provider]  Cyanocobalamin (VITAMIN B-12) 2500 MCG SUBL Place 2,500 mcg under the tongue daily.    [provider]  diazepam (VALIUM) 5 MG tablet Take 1-2 tablets (5-10 mg total) by mouth every 6 (six) hours as needed for muscle spasms. 06/16/17   Earnie Larsson, MD  docusate sodium (COLACE) 100 MG capsule Take 100 mg by mouth daily.    [provider]  DULoxetine (CYMBALTA) 30 MG capsule Take 60 mg by mouth daily. 04/10/17   [provider]  ergocalciferol (VITAMIN D2) 50000 units capsule Take 50,000 Units by mouth every Sunday.    [provider]  fluticasone (FLONASE) 50 MCG/ACT nasal spray Place 1-2 sprays into both nostrils daily as needed. For nasal congestion. 10/22/15   [provider]  hydrochlorothiazide (HYDRODIURIL) 25 MG tablet Take 25 mg by mouth daily. 05/21/17   [provider]  HYDROcodone-acetaminophen (NORCO) 10-325 MG tablet Take 1-2 tablets by mouth every 4 (four) hours as needed (breakthrough pain). 06/16/17   Julio Sicks, MD  levocetirizine (XYZAL) 5 MG tablet Take 5 mg by mouth at bedtime. 03/27/17   [provider]  levothyroxine (SYNTHROID, LEVOTHROID) 50 MCG tablet Take 50 mcg by mouth daily before breakfast. 04/04/17   [provider]  metFORMIN (GLUCOPHAGE) 500 MG tablet Take 500 mg by mouth 2 (two) times daily. 05/12/17   [provider]  metoprolol succinate (TOPROL-XL) 100 MG 24 hr tablet Take 100 mg  by mouth 2 (two) times daily. 04/10/17   [provider]  Omega-3 Fatty Acids (OMEGA 3 PO) Take 450 mg by mouth daily. OMEGA XL 150 MG PER CAPSULE    [provider]  omeprazole (PRILOSEC) 40 MG capsule Take 40 mg by mouth daily before breakfast.    [provider]  Polyethylene Glycol 400 (BLINK TEARS) 0.25 % SOLN Place 1-2 drops into both eyes 3 (three) times daily as needed (for dry eyes.).    [provider]  Potassium 99 MG TABS Take 99 mg by mouth at bedtime.    [provider]  simvastatin (ZOCOR) 40 MG tablet Take 40 mg by mouth at bedtime. 04/07/17   [provider]  Skin Protectants, Misc. (EUCERIN) cream Apply 1 application topically 5 (five) times daily as needed (for dry skin throughout day as needed for handwashing).    [provider]  traZODone (DESYREL) 100 MG tablet Take 100 mg by mouth at bedtime. 30 minutes before bedtime. 05/08/17   [provider]  triamcinolone cream (KENALOG) 0.1 % Apply 1 application topically 2 (two) times daily as needed (for psoriasis (typically once daily)).    [provider]    Allergies Patient has no known allergies.  Family History  Problem Relation Age of Onset   Breast cancer Sister 89   Uterine cancer Mother    Diabetes Mother    Glaucoma Mother    Heart disease Father    Peripheral vascular disease Father     Social History Social History   Tobacco Use   Smoking status: Never   Smokeless tobacco: Never  Vaping Use   Vaping Use: Never used  Substance Use Topics   Alcohol use: No   Drug use: No     Review of Systems  Constitutional: No fever/chills Eyes: No visual changes. No discharge ENT: No upper respiratory complaints. Cardiovascular: no chest pain. Respiratory: no cough. No SOB. Gastrointestinal: No abdominal pain.  No nausea, no vomiting.  No diarrhea.  No constipation. Genitourinary: Negative for dysuria. No hematuria Musculoskeletal:  Positive for sharp left back and left hip pain Skin: Negative for rash, abrasions, lacerations, ecchymosis. Neurological: Negative for headaches, focal weakness or numbness.  10 System ROS otherwise negative.  ____________________________________________   PHYSICAL EXAM:  VITAL SIGNS: ED Triage Vitals  Enc Vitals Group     BP 09/23/21 1440 (!) 176/93     Pulse Rate 09/23/21 1440 91     Resp 09/23/21 1440 16     Temp 09/23/21 1439 98.2 F (36.8 C)     Temp Source 09/23/21 1439 Oral     SpO2 09/23/21 1440 99 %  Weight 09/23/21 1440 201 lb (91.2 kg)     Height 09/23/21 1440 5\' 4"  (1.626 m)     Head Circumference --      Peak Flow --      Pain Score 09/23/21 1440 10     Pain Loc --      Pain Edu? --      Excl. in [place]? --      Constitutional: Alert and oriented. Well appearing and in no acute distress. Eyes: Conjunctivae are normal. PERRL. EOMI. Head: Atraumatic. ENT:      Ears:       Nose: No congestion/rhinnorhea.      Mouth/Throat: Mucous membranes are moist.  Neck: No stridor.    Cardiovascular: Normal rate, regular rhythm. Normal S1 and S2.  Good peripheral circulation. Respiratory: Normal respiratory effort without tachypnea or retractions. Lungs CTAB. Good air entry to the bases with no decreased or absent breath sounds. Musculoskeletal: Full range of motion to all extremities. No gross deformities appreciated.  Significant tenderness over the lumbar spine, roughly mid lumbar spine extending into the SI joint, sciatic notch, left hip.  Patient is still able to ambulate but does so with difficulty.  Obviously uncomfortable.  Sensation intact in all dermatomal distributions.  Pulses intact. Neurologic:  Normal speech and language. No gross focal neurologic deficits are appreciated.  Skin:  Skin is warm, dry and intact. No rash noted. Psychiatric: Mood and affect are normal. Speech and behavior are normal. Patient exhibits appropriate insight and  judgement.   ____________________________________________   LABS (all labs ordered are listed, but only abnormal results are displayed)  Labs Reviewed  URINALYSIS, ROUTINE W REFLEX MICROSCOPIC - Abnormal; Notable for the following components:      Result Value   Color, Urine YELLOW (*)    APPearance CLEAR (*)    Glucose, UA 50 (*)    All other components within normal limits   ____________________________________________  EKG   ____________________________________________  RADIOLOGY I personally viewed and evaluated these images as part of my medical decision making, as well as reviewing the written report by the radiologist.  ED Provider Interpretation: No acute findings on x-ray.  DG Lumbar Spine 2-3 Views  Result Date: 09/23/2021 CLINICAL DATA:  Hip and back pain.  History of lumbar fusion. EXAM: LUMBAR SPINE - 2-3 VIEW COMPARISON:  X-ray lumbar spine 09/03/2017 FINDINGS: Aplastic T12 ribs.  Five non-rib-bearing lumbar vertebral bodies. Prior L2 through L5 posterolateral and interbody fusion. Associated osseous fusion of the vertebral bodies laterally. There is no evidence of lumbar spine fracture. Alignment is grossly unremarkable with slight straightening of the normal lumbar lordosis likely due to surgical hardware fusion. Non-surgerized intervertebral disc spaces demonstrate slightly increased narrowing. Associated multilevel facet arthropathy and mild osteophyte formation. IMPRESSION: No acute displaced fracture or traumatic listhesis of the lumbar spine in a patient status post L2 through L5 posterior interbody fusion. Electronically Signed   By: Iven Finn M.D.   On: 09/23/2021 15:26   DG Hip Unilat W or Wo Pelvis 2-3 Views Left  Result Date: 09/23/2021 CLINICAL DATA:  Hip and back pain.  History of lumbar fusion EXAM: DG HIP (WITH OR WITHOUT PELVIS) 2-3V LEFT COMPARISON:  None. FINDINGS: There is no evidence of hip fracture or dislocation. No acute displaced  fracture or diastasis of the bones of the pelvis. There is no evidence of severe arthropathy or other focal bone abnormality. IMPRESSION: Negative for acute traumatic injury. Electronically Signed   By: Clelia Croft.D.  On: 09/23/2021 15:27    ____________________________________________    PROCEDURES  Procedure(s) performed:    Procedures    Medications  HYDROmorphone (DILAUDID) injection 1 mg (has no administration in time range)  fentaNYL (SUBLIMAZE) injection 50 mcg (50 mcg Intramuscular Given 09/23/21 1445)  ondansetron (ZOFRAN-ODT) disintegrating tablet 8 mg (8 mg Oral Given 09/23/21 1444)  HYDROmorphone (DILAUDID) injection 1 mg (1 mg Intravenous Given 09/23/21 1705)  ondansetron (ZOFRAN) injection 4 mg (4 mg Intravenous Given 09/23/21 1705)  HYDROmorphone (DILAUDID) injection 1 mg (1 mg Intravenous Given 09/23/21 1837)     ____________________________________________   INITIAL IMPRESSION / ASSESSMENT AND PLAN / ED COURSE  Pertinent labs & imaging results that were available during my care of the patient were reviewed by me and considered in my medical decision making (see chart for details).  Review of the [place] CSRS was performed in accordance of the [place] prior to dispensing any controlled drugs.         Patient presented to the emergency department with severe left sided lower back pain radiating into the left hip and groin.  Patient has a history of lumbar fusion, was walking to the restroom when she felt a sharp sensation in her back.  Since then she has had increasing terrible pain in the left lower back and left hip.  She still able to move her lower extremities but states that any ambulation drastically increases her symptoms.  Patient did not have any direct trauma.  No urinary or GI complaints.  Patient states that she has pain radiating into her groin but there is no saddle anesthesia.  She has no loss of sensation in her lower extremities.  Patient had good  sensation on palpation and good pulses.  Negative x-ray and I ordered MRI given the severity of the pain.  Patient has been unable to tolerate laying on the MRI table and as such I will redose the pain medication prior to CT scan.  At this time patient will likely need admission regardless of the results whether for pain control versus acute findings on imaging.  If CT scans do not reveal any significant finding I do suspect the patient will ultimately need stronger medications to be tolerate MRI.  At this time patient is awaiting CT scans and I have transferred the care to attending provider, Dr. Vladimir Crofts pending these results and disposition.    This chart was dictated using voice recognition software/Dragon. Despite best efforts to proofread, errors can occur which can change the meaning. Any change was purely unintentional.    Darletta Moll, PA-C 09/23/21 1948    Vladimir Crofts, MD 09/23/21 2120

## 2021-09-23 NOTE — ED Notes (Signed)
Adaline Sill, MD at bedside monitoring patient after administration of ketamine. Patient tolerating medication well.

## 2021-09-23 NOTE — H&P (Signed)
History and Physical    Margaret Frye AVW:[id number] DOB: [birth year] DOA: 09/23/2021  PCP: Marguarite Arbour, MD   Patient coming from: home  I have personally briefly reviewed patient's relevant medical records in [facility]  Chief Complaint: left hip and leg pain  HPI: Margaret Frye is a 75 y.o. female with medical history significant for DM, HTN, TIA, asthma, migraine, lumbar laminectomy , ACDF, DJD of the knees, on chronic opiates for over 20 years who presents to the ED with sudden onset left-sided lower back and hip pain a few days prior but has been steadily worsening since.  The pain is radiating down her leg and is now of severe intensity.  She has been taking her pain medication as well as muscle relaxants without relief.  She denies bowel or bladder dysfunction or numbness or tingling in the groin area or descriptions of saddle anesthesia.  She denies weakness in the lower extremities.  She denies fever or chills  ED course: On arrival, BP 176/93 with otherwise unremarkable vitals Labs pending  Imaging: CT lumbar spine significant for new moderate to large left foraminal disc protrusion at L1-L2 likely affecting the left L1 nerve root CT pelvis, x-ray left hip and x-ray lumbar spine without acute findings  Patient received 3 rounds of Dilaudid as well as fentanyl in the ED without adequate pain relief.  MRI was attempted but patient was unable to tolerate lying flat for prolonged period and had to be canceled.  Decadron and ketamine ordered.  Hospitalist consulted for admission.    Review of Systems: As per HPI otherwise all other systems on review of systems negative.    Past Medical History:  Diagnosis Date   Anemia    Arthritis    Asthma    Borderline glaucoma    Depression    Diabetes mellitus without complication (HCC)    Edema    Elevated liver enzymes    Environmental allergies    Family history of adverse reaction to anesthesia    Daughter had PONV   GERD  (gastroesophageal reflux disease)    Headache    migraine   Heart palpitations    History of hiatal hernia    Hyperlipidemia    Hypertension    Hypothyroidism    Lumbar stenosis    PONV (postoperative nausea and vomiting)    Sleep apnea    does not wear CPAP " my oxygen level drops when I sleep"   TIA (transient ischemic attack)    Vitamin B deficiency    Vitamin D deficiency    Wears glasses    Wears partial dentures     Past Surgical History:  Procedure Laterality Date   ABDOMINAL HYSTERECTOMY      " partial"   anterior cevical fusion     APPENDECTOMY     BACK SURGERY     lumbar laminectomy   BREAST EXCISIONAL BIOPSY Left 2000   benign   CATARACT EXTRACTION W/ INTRAOCULAR LENS  IMPLANT, BILATERAL     CHOLECYSTECTOMY     COLONOSCOPY W/ BIOPSIES     MULTIPLE TOOTH EXTRACTIONS     TUBAL LIGATION       reports that she has never smoked. She has never used smokeless tobacco. She reports that she does not drink alcohol and does not use drugs.  No Known Allergies  Family History  Problem Relation Age of Onset   Breast cancer Sister 71   Uterine cancer Mother  Diabetes Mother    Glaucoma Mother    Heart disease Father    Peripheral vascular disease Father       Prior to Admission medications   Medication Sig Start Date End Date Taking? Authorizing Provider  aspirin 81 MG chewable tablet Chew 81 mg by mouth daily.    [provider]  Cyanocobalamin (VITAMIN B-12) 2500 MCG SUBL Place 2,500 mcg under the tongue daily.    [provider]  diazepam (VALIUM) 5 MG tablet Take 1-2 tablets (5-10 mg total) by mouth every 6 (six) hours as needed for muscle spasms. 06/16/17   Julio Sicks, MD  docusate sodium (COLACE) 100 MG capsule Take 100 mg by mouth daily.    [provider]  DULoxetine (CYMBALTA) 30 MG capsule Take 60 mg by mouth daily. 04/10/17   [provider]  ergocalciferol (VITAMIN D2) 50000 units capsule Take 50,000 Units by mouth  every Sunday.    [provider]  fluticasone (FLONASE) 50 MCG/ACT nasal spray Place 1-2 sprays into both nostrils daily as needed. For nasal congestion. 10/22/15   [provider]  hydrochlorothiazide (HYDRODIURIL) 25 MG tablet Take 25 mg by mouth daily. 05/21/17   [provider]  HYDROcodone-acetaminophen (NORCO) 10-325 MG tablet Take 1-2 tablets by mouth every 4 (four) hours as needed (breakthrough pain). 06/16/17   Julio Sicks, MD  levocetirizine (XYZAL) 5 MG tablet Take 5 mg by mouth at bedtime. 03/27/17   [provider]  levothyroxine (SYNTHROID, LEVOTHROID) 50 MCG tablet Take 50 mcg by mouth daily before breakfast. 04/04/17   [provider]  metFORMIN (GLUCOPHAGE) 500 MG tablet Take 500 mg by mouth 2 (two) times daily. 05/12/17   [provider]  metoprolol succinate (TOPROL-XL) 100 MG 24 hr tablet Take 100 mg by mouth 2 (two) times daily. 04/10/17   [provider]  Omega-3 Fatty Acids (OMEGA 3 PO) Take 450 mg by mouth daily. OMEGA XL 150 MG PER CAPSULE    [provider]  omeprazole (PRILOSEC) 40 MG capsule Take 40 mg by mouth daily before breakfast.    [provider]  Polyethylene Glycol 400 (BLINK TEARS) 0.25 % SOLN Place 1-2 drops into both eyes 3 (three) times daily as needed (for dry eyes.).    [provider]  Potassium 99 MG TABS Take 99 mg by mouth at bedtime.    [provider]  simvastatin (ZOCOR) 40 MG tablet Take 40 mg by mouth at bedtime. 04/07/17   [provider]  Skin Protectants, Misc. (EUCERIN) cream Apply 1 application topically 5 (five) times daily as needed (for dry skin throughout day as needed for handwashing).    [provider]  traZODone (DESYREL) 100 MG tablet Take 100 mg by mouth at bedtime. 30 minutes before bedtime. 05/08/17   [provider]  triamcinolone cream (KENALOG) 0.1 % Apply 1 application topically 2 (two) times daily as needed (for  psoriasis (typically once daily)).    [provider]    Physical Exam: Vitals:   09/23/21 1439 09/23/21 1440 09/23/21 1839  BP:  (!) 176/93 (!) 166/71  Pulse:  91 89  Resp:  16 16  Temp: 98.2 F (36.8 C)    TempSrc: Oral    SpO2:  99% 99%  Weight:  91.2 kg   Height:  5\' 4"  (1.626 m)    Constitutional: Alert and oriented x 3 .  In mild pain discomfort HEENT:      Head: Normocephalic and atraumatic.  Eyes: PERLA, EOMI, Conjunctivae are normal. Sclera is non-icteric.       Mouth/Throat: Mucous membranes are moist.       Neck: Supple with no signs of meningismus. Cardiovascular: Regular rate and rhythm. No murmurs, gallops, or rubs. 2+ symmetrical distal pulses are present . No JVD. No  LE edema Respiratory: Respiratory effort normal .Lungs sounds clear bilaterally. No wheezes, crackles, or rhonchi.  Gastrointestinal: Soft, non tender, non distended. Positive bowel sounds.  Genitourinary: No CVA tenderness. Musculoskeletal: Tender over left SI joint and left hip with positive straight leg raise test.  Full range of motion all other extremities.  Neurovascularly intact Neurologic:  Face is symmetric. Moving all extremities. No gross focal neurologic deficits . Skin: Skin is warm, dry.  No rash or ulcers Psychiatric: Mood and affect are appropriate    Labs on Admission: I have personally reviewed following labs and imaging studies  CBC: No results for input(s): WBC, NEUTROABS, HGB, HCT, MCV, PLT in the last 168 hours. Basic Metabolic Panel: No results for input(s): NA, K, CL, CO2, GLUCOSE, BUN, CREATININE, CALCIUM, MG, PHOS in the last 168 hours. GFR: CrCl cannot be calculated (Patient's most recent lab result is older than the maximum 21 days allowed.). Liver Function Tests: No results for input(s): AST, ALT, ALKPHOS, BILITOT, PROT, ALBUMIN in the last 168 hours. No results for input(s): LIPASE, AMYLASE in the last 168 hours. No results for input(s): AMMONIA in  the last 168 hours. Coagulation Profile: No results for input(s): INR, PROTIME in the last 168 hours. Cardiac Enzymes: No results for input(s): CKTOTAL, CKMB, CKMBINDEX, TROPONINI in the last 168 hours. BNP (last 3 results) No results for input(s): PROBNP in the last 8760 hours. HbA1C: No results for input(s): HGBA1C in the last 72 hours. CBG: No results for input(s): GLUCAP in the last 168 hours. Lipid Profile: No results for input(s): CHOL, HDL, LDLCALC, TRIG, CHOLHDL, LDLDIRECT in the last 72 hours. Thyroid Function Tests: No results for input(s): TSH, T4TOTAL, FREET4, T3FREE, THYROIDAB in the last 72 hours. Anemia Panel: No results for input(s): VITAMINB12, FOLATE, FERRITIN, TIBC, IRON, RETICCTPCT in the last 72 hours. Urine analysis:    Component Value Date/Time   COLORURINE YELLOW (A) 09/23/2021 1545   APPEARANCEUR CLEAR (A) 09/23/2021 1545   APPEARANCEUR Clear 12/20/2014 1433   LABSPEC 1.012 09/23/2021 1545   LABSPEC 1.003 12/20/2014 1433   PHURINE 5.0 09/23/2021 1545   GLUCOSEU 50 (A) 09/23/2021 1545   GLUCOSEU Negative 12/20/2014 1433   HGBUR NEGATIVE 09/23/2021 1545   BILIRUBINUR NEGATIVE 09/23/2021 1545   BILIRUBINUR Negative 12/20/2014 1433   KETONESUR NEGATIVE 09/23/2021 1545   PROTEINUR NEGATIVE 09/23/2021 1545   NITRITE NEGATIVE 09/23/2021 1545   LEUKOCYTESUR NEGATIVE 09/23/2021 1545   LEUKOCYTESUR Negative 12/20/2014 1433    Radiological Exams on Admission: DG Lumbar Spine 2-3 Views  Result Date: 09/23/2021 CLINICAL DATA:  Hip and back pain.  History of lumbar fusion. EXAM: LUMBAR SPINE - 2-3 VIEW COMPARISON:  X-ray lumbar spine 09/03/2017 FINDINGS: Aplastic T12 ribs.  Five non-rib-bearing lumbar vertebral bodies. Prior L2 through L5 posterolateral and interbody fusion. Associated osseous fusion of the vertebral bodies laterally. There is no evidence of lumbar spine fracture. Alignment is grossly unremarkable with slight straightening of the normal lumbar  lordosis likely due to surgical hardware fusion. Non-surgerized intervertebral disc spaces demonstrate slightly increased narrowing. Associated multilevel facet arthropathy and mild osteophyte formation. IMPRESSION: No acute displaced fracture or traumatic listhesis of the lumbar spine in a patient status post  L2 through L5 posterior interbody fusion. Electronically Signed   By: Tish Frederickson M.D.   On: 09/23/2021 15:26   CT Lumbar Spine Wo Contrast  Result Date: 09/23/2021 CLINICAL DATA:  Low back pain and left hip pain. History of prior lumbar surgery. EXAM: CT LUMBAR SPINE WITHOUT CONTRAST TECHNIQUE: Multidetector CT imaging of the lumbar spine was performed without intravenous contrast administration. Multiplanar CT image reconstructions were also generated. COMPARISON:  Lumbar spine MRI 07/07/2019 FINDINGS: Segmentation: There are five lumbar type vertebral bodies. The last full intervertebral disc space is labeled L5-S1. Alignment: Normal Vertebrae: No fracture or bone lesion. Moderate artifact associated with extensive fusion hardware with posterior and interbody fusion from L2-L5. I do not see any obvious complicating features associated with the hardware. No hardware fracture or loosening. There appears to be solid areas of interbody fusion at the disc space levels. Paraspinal and other soft tissues: No significant paraspinal or retroperitoneal findings. Disc levels: L1-2: No disc protrusions, focal moderate-sized left foraminal disc protrusion likely affecting the left L1 nerve root. This was not present on the prior MRI examination. L2-3: Posterior and interbody fusion changes. Wide decompressive laminectomy. No obvious spinal or foraminal stenosis. L3-4: Posterior and interbody fusion changes. Wide decompressive laminectomy. No obvious spinal or foraminal stenosis. L4-5: Posterior and interbody fusion changes. Wide decompressive laminectomy. No obvious spinal or foraminal stenosis. L5-S1: Moderate  to advanced facet disease and 80 mild bulging annulus but no significant spinal or foraminal stenosis. IMPRESSION: 1. New moderate to large left foraminal disc protrusion at L1-2 likely affecting the left L1 nerve root. 2. Posterior and interbody fusion changes from L2-L5 without obvious complicating features. There appears to be solid areas of interbody fusion at the disc space levels. Electronically Signed   By: Rudie Meyer M.D.   On: 09/23/2021 20:12   CT PELVIS WO CONTRAST  Result Date: 09/23/2021 CLINICAL DATA:  Left hip pain. EXAM: CT PELVIS WITHOUT CONTRAST TECHNIQUE: Multidetector CT imaging of the pelvis was performed following the standard protocol without intravenous contrast. COMPARISON:  None. FINDINGS: Both hips are normally located. Mild degenerative changes. No fracture or AVN. No hip joint effusion. No periarticular fluid collections. The pubic symphysis and SI joints are intact. No pelvic fractures or bone lesions. The surrounding hip and pelvic musculature are grossly normal by CT. No obvious muscle tear or intramuscular hematoma. No significant intrapelvic abnormalities are identified. No inguinal mass or hernia. IMPRESSION: 1. Mild degenerative changes involving the hips but no fracture or AVN. 2. No acute bony findings. 3. The surrounding hip and pelvic musculature are grossly normal by CT. No obvious muscle tear or intramuscular hematoma. Electronically Signed   By: Rudie Meyer M.D.   On: 09/23/2021 20:14   DG Hip Unilat W or Wo Pelvis 2-3 Views Left  Result Date: 09/23/2021 CLINICAL DATA:  Hip and back pain.  History of lumbar fusion EXAM: DG HIP (WITH OR WITHOUT PELVIS) 2-3V LEFT COMPARISON:  None. FINDINGS: There is no evidence of hip fracture or dislocation. No acute displaced fracture or diastasis of the bones of the pelvis. There is no evidence of severe arthropathy or other focal bone abnormality. IMPRESSION: Negative for acute traumatic injury. Electronically Signed   By:  Tish Frederickson M.D.   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  Valium as muscle relaxant - MRI once pain controlled and able to tolerate - Neurosurgery consult requested - Consider IR consult in the a.m. for pain control if needed - We will keep n.p.o. and SCDs for DVT prophylaxis in case of procedure     HTN (hypertension) - Slightly elevated, possibly related to pain - Continue home HCTZ    Hypothyroidism - Continue home levothyroxine    Type II diabetes mellitus  -sliding scale insulin  Chronic pain - Patient on chronic opiates  Insomnia - Continue trazodone  History of TIA - Continue aspirin and simvastatin    DVT prophylaxis: Lovenox  Code Status: full code  Family Communication:  son at bedside Disposition Plan: Back to previous home environment Consults called: none  Status:At the time of admission, it appears that the appropriate admission status for this patient is INPATIENT. This is judged to be reasonable and necessary in order to provide the required intensity of service to ensure the patient's safety given the presenting symptoms, physical exam findings, and initial radiographic and laboratory data in the context of their  Comorbid conditions.   Patient requires inpatient status due to high intensity of service, high risk for further deterioration and high frequency of surveillance required.    I certify that at the point of admission it is my clinical judgment that the patient will require inpatient hospital care spanning beyond 2 midnights    Margaret Baumann MD [facility]   09/23/2021, 9:12 PM

## 2021-09-24 ENCOUNTER — Other Ambulatory Visit: Payer: Self-pay

## 2021-09-24 ENCOUNTER — Inpatient Hospital Stay: Payer: HMO

## 2021-09-24 DIAGNOSIS — F1190 Opioid use, unspecified, uncomplicated: Secondary | ICD-10-CM

## 2021-09-24 DIAGNOSIS — E118 Type 2 diabetes mellitus with unspecified complications: Secondary | ICD-10-CM

## 2021-09-24 DIAGNOSIS — I10 Essential (primary) hypertension: Secondary | ICD-10-CM

## 2021-09-24 DIAGNOSIS — M5126 Other intervertebral disc displacement, lumbar region: Secondary | ICD-10-CM

## 2021-09-24 DIAGNOSIS — M5459 Other low back pain: Secondary | ICD-10-CM

## 2021-09-24 DIAGNOSIS — E039 Hypothyroidism, unspecified: Secondary | ICD-10-CM

## 2021-09-24 LAB — BASIC METABOLIC PANEL
Anion gap: 11 (ref 5–15)
BUN: 18 mg/dL (ref 8–23)
CO2: 27 mmol/L (ref 22–32)
Calcium: 9.2 mg/dL (ref 8.9–10.3)
Chloride: 97 mmol/L — ABNORMAL LOW (ref 98–111)
Creatinine, Ser: 0.8 mg/dL (ref 0.44–1.00)
GFR, Estimated: >60 mL/min (ref >60)
Glucose, Bld: 161 mg/dL — ABNORMAL HIGH (ref 70–99)
Potassium: 3.7 mmol/L (ref 3.5–5.1)
Sodium: 135 mmol/L (ref 135–145)

## 2021-09-24 LAB — HEMOGLOBIN A1C
Hgb A1c MFr Bld: 6.8 % — ABNORMAL HIGH (ref 4.8–5.6)
Mean Plasma Glucose: 148.46 mg/dL

## 2021-09-24 LAB — CBC WITH DIFFERENTIAL/PLATELET
Abs Immature Granulocytes: 0.06 10*3/uL (ref 0.00–0.07)
Basophils Absolute: 0 10*3/uL (ref 0.0–0.1)
Basophils Relative: 0 %
Eosinophils Absolute: 0 10*3/uL (ref 0.0–0.5)
Eosinophils Relative: 0 %
HCT: 39.4 % (ref 36.0–46.0)
Hemoglobin: 13.2 g/dL (ref 12.0–15.0)
Immature Granulocytes: 1 %
Lymphocytes Relative: 19 %
Lymphs Abs: 1.4 10*3/uL (ref 0.7–4.0)
MCH: 30.3 pg (ref 26.0–34.0)
MCHC: 33.5 g/dL (ref 30.0–36.0)
MCV: 90.6 fL (ref 80.0–100.0)
Monocytes Absolute: 0.2 10*3/uL (ref 0.1–1.0)
Monocytes Relative: 3 %
Neutro Abs: 5.5 10*3/uL (ref 1.7–7.7)
Neutrophils Relative %: 77 %
Platelets: 275 10*3/uL (ref 150–400)
RBC: 4.35 MIL/uL (ref 3.87–5.11)
RDW: 13.2 % (ref 11.5–15.5)
WBC: 7.3 10*3/uL (ref 4.0–10.5)
nRBC: 0 % (ref 0.0–0.2)

## 2021-09-24 LAB — GLUCOSE, CAPILLARY: Glucose-Capillary: 193 mg/dL — ABNORMAL HIGH (ref 70–99)

## 2021-09-24 LAB — CBG MONITORING, ED
Glucose-Capillary: 181 mg/dL — ABNORMAL HIGH (ref 70–99)
Glucose-Capillary: 153 mg/dL — ABNORMAL HIGH (ref 70–99)
Glucose-Capillary: 233 mg/dL — ABNORMAL HIGH (ref 70–99)
Glucose-Capillary: 247 mg/dL — ABNORMAL HIGH (ref 70–99)

## 2021-09-24 MED ORDER — GABAPENTIN 300 MG PO CAPS
300.0000 mg | ORAL_CAPSULE | Freq: Two times a day (BID) | ORAL | Status: DC
  Administered 2021-09-24 (×2): 300 mg via ORAL
  Filled 2021-09-24 (×2): qty 1

## 2021-09-24 MED ORDER — INSULIN ASPART 100 UNIT/ML IJ SOLN
0.0000 [IU] | Freq: Every day | INTRAMUSCULAR | Status: DC
  Administered 2021-09-26: 3 [IU] via SUBCUTANEOUS
  Administered 2021-09-27: 2 [IU] via SUBCUTANEOUS
  Administered 2021-09-29: 4 [IU] via SUBCUTANEOUS
  Administered 2021-09-30: 2 [IU] via SUBCUTANEOUS
  Filled 2021-09-24 (×4): qty 1

## 2021-09-24 MED ORDER — PANTOPRAZOLE SODIUM 40 MG PO TBEC
80.0000 mg | DELAYED_RELEASE_TABLET | Freq: Every day | ORAL | Status: DC
  Administered 2021-09-24 – 2021-10-01 (×8): 80 mg via ORAL
  Filled 2021-09-24 (×8): qty 2

## 2021-09-24 MED ORDER — TRAZODONE HCL 100 MG PO TABS
100.0000 mg | ORAL_TABLET | Freq: Every day | ORAL | Status: DC
  Administered 2021-09-24 – 2021-09-30 (×7): 100 mg via ORAL
  Filled 2021-09-24 (×7): qty 1

## 2021-09-24 MED ORDER — HYDRALAZINE HCL 25 MG PO TABS
25.0000 mg | ORAL_TABLET | Freq: Four times a day (QID) | ORAL | Status: DC | PRN
  Administered 2021-09-24: 25 mg via ORAL
  Filled 2021-09-24: qty 1

## 2021-09-24 MED ORDER — HYDROCODONE-ACETAMINOPHEN 10-325 MG PO TABS
1.0000 | ORAL_TABLET | ORAL | Status: DC | PRN
  Administered 2021-09-24 – 2021-10-01 (×29): 1 via ORAL
  Filled 2021-09-24 (×30): qty 1

## 2021-09-24 MED ORDER — ZOLPIDEM TARTRATE 5 MG PO TABS
5.0000 mg | ORAL_TABLET | Freq: Every evening | ORAL | Status: DC | PRN
  Administered 2021-09-24: 5 mg via ORAL
  Filled 2021-09-24: qty 1

## 2021-09-24 MED ORDER — METOPROLOL SUCCINATE ER 50 MG PO TB24
100.0000 mg | ORAL_TABLET | Freq: Two times a day (BID) | ORAL | Status: DC
  Administered 2021-09-24 – 2021-10-01 (×12): 100 mg via ORAL
  Filled 2021-09-24 (×15): qty 2

## 2021-09-24 MED ORDER — INSULIN ASPART 100 UNIT/ML IJ SOLN
0.0000 [IU] | Freq: Three times a day (TID) | INTRAMUSCULAR | Status: DC
  Administered 2021-09-24 – 2021-09-25 (×3): 5 [IU] via SUBCUTANEOUS
  Administered 2021-09-25 (×2): 11 [IU] via SUBCUTANEOUS
  Administered 2021-09-26: 3 [IU] via SUBCUTANEOUS
  Administered 2021-09-26 (×2): 8 [IU] via SUBCUTANEOUS
  Administered 2021-09-27: 2 [IU] via SUBCUTANEOUS
  Administered 2021-09-27 – 2021-09-28 (×3): 3 [IU] via SUBCUTANEOUS
  Administered 2021-09-28 (×2): 8 [IU] via SUBCUTANEOUS
  Administered 2021-09-29: 11 [IU] via SUBCUTANEOUS
  Administered 2021-09-29: 5 [IU] via SUBCUTANEOUS
  Administered 2021-09-30: 11 [IU] via SUBCUTANEOUS
  Administered 2021-09-30 – 2021-10-01 (×2): 5 [IU] via SUBCUTANEOUS
  Filled 2021-09-24 (×18): qty 1

## 2021-09-24 MED ORDER — GABAPENTIN 300 MG PO CAPS
600.0000 mg | ORAL_CAPSULE | Freq: Every day | ORAL | Status: DC
  Administered 2021-09-24 – 2021-09-30 (×7): 600 mg via ORAL
  Filled 2021-09-24 (×7): qty 2

## 2021-09-24 NOTE — Evaluation (Signed)
Physical Therapy Evaluation Patient Details Name: Margaret Frye MRN: [id number] DOB: 04/24/1946 Today's Date: 09/24/2021  History of Present Illness  Patient is a 75 year old female presenting with progressive, intractable pain despite home opioids/muscle relaxants without trauma/fall. Found to have foraminal disc herniation at the left L1-2 level, left L1 radiculopathy with recommendation for conservative management. 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   Clinical Impression  Patient is cooperative with PT evaluation. She reports she was independent with mobility including walking without assistive device prior to recent pain flair up at home. She lives with her family.  Patient has decreased sensation to left anterior thigh. She complains of left groin pain that increased to 8/10 with sitting up on the side of the bed. Sitting tolerance is less than 20 seconds, limited by pain. Patient educated on positioning techniques to alleviate pain while in the bed. She is hopeful to be discharged home when her pain is better controlled. Recommend PT follow up while in the hospital to maximize independence and facilitate return to prior level of function. Anticipate the need for home health PT at discharge.      Recommendations for follow up therapy are one component of a multi-disciplinary discharge planning process, led by the attending physician.  Recommendations may be updated based on patient status, additional functional criteria and insurance authorization.  Follow Up Recommendations Home health PT    Assistance Recommended at Discharge Intermittent Supervision/Assistance  Functional Status Assessment Patient has had a recent decline in their functional status and demonstrates the  ability to make significant improvements in function in a reasonable and predictable amount of time.  Equipment Recommendations  None recommended by PT    Recommendations for Other Services       Precautions / Restrictions Precautions Precautions: Fall Restrictions Weight Bearing Restrictions: No      Mobility  Bed Mobility Overal bed mobility: Needs Assistance Bed Mobility: Supine to Sit;Sit to Supine     Supine to sit: Min guard;HOB elevated Sit to supine: Min assist;HOB elevated   General bed mobility comments: increased time and effort required for bed mobility. patient complains of increased pain to 8/10 with sitting upright for less than 20 seconds    Transfers                   General transfer comment: not attempted, limited activity tolerance with increased pain in sitting position    Ambulation/Gait                Stairs            Wheelchair Mobility    Modified Rankin (Stroke Patients Only)       Balance Overall balance assessment: Needs assistance Sitting-balance support: Bilateral upper extremity supported Sitting balance-Leahy Scale: Fair Sitting balance - Comments: patient relying on UE support in sitting (likely related to increased pain with activity and not true balance deficit)                                     Pertinent Vitals/Pain Pain Assessment: 0-10 Pain Score: 8  Pain Location: left groin and lower back Pain Descriptors / Indicators: Discomfort Pain Intervention(s): Limited activity within patient's tolerance;Premedicated before session;Repositioned;Ice applied (ice pack applied to left groin at end  of session)    Home Living Family/patient expects to be discharged to:: Private residence Living Arrangements: Spouse/significant other;Children Available Help at Discharge: Family Type of Home: House       Alternate Level Stairs-Number of Steps:  (chair lift to second floor) Home Layout: Two  level;Bed/bath upstairs Home Equipment: Rolling Walker (2 wheels);Shower seat      Prior Function Prior Level of Function : Independent/Modified Independent;Driving             Mobility Comments: independent without assistive device       Hand Dominance        Extremity/Trunk Assessment   Upper Extremity Assessment Upper Extremity Assessment: Overall WFL for tasks assessed    Lower Extremity Assessment Lower Extremity Assessment: LLE deficits/detail LLE Deficits / Details: dorsiflexion/plantarflexion 5/5, patient able to complete short arc quad set. unable to assess further due to pain LLE: Unable to fully assess due to pain LLE Sensation: decreased light touch (anterior thigh decreased light touch)       Communication   Communication: No difficulties  Cognition Arousal/Alertness: Awake/alert Behavior During Therapy: WFL for tasks assessed/performed Overall Cognitive Status: Within Functional Limits for tasks assessed                                          General Comments General comments (skin integrity, edema, etc.): patient educated on positioning techniques to help alleviate back pain while in the bed. ice pack applied to left groin and towel roll supporting left flank at end of session per patient request for comfort    Exercises     Assessment/Plan    PT Assessment Patient needs continued PT services  PT Problem List Decreased strength;Decreased range of motion;Decreased activity tolerance;Decreased mobility;Pain       PT Treatment Interventions DME instruction;Gait training;Stair training;Functional mobility training;Therapeutic activities;Therapeutic exercise;Neuromuscular re-education;Balance training;Patient/family education    PT Goals (Current goals can be found in the Care Plan section)  Acute Rehab PT Goals Patient Stated Goal: to go home when pain controlled PT Goal Formulation: With patient Time For Goal Achievement:  10/08/21 Potential to Achieve Goals: Good    Frequency Min 2X/week   Barriers to discharge        Co-evaluation               AM-PAC PT "6 Clicks" Mobility  Outcome Measure Help needed turning from your back to your side while in a flat bed without using bedrails?: None Help needed moving from lying on your back to sitting on the side of a flat bed without using bedrails?: A Little Help needed moving to and from a bed to a chair (including a wheelchair)?: A Little Help needed standing up from a chair using your arms (e.g., wheelchair or bedside chair)?: A Little Help needed to walk in hospital room?: A Little Help needed climbing 3-5 steps with a railing? : A Little 6 Click Score: 19    End of Session Equipment Utilized During Treatment: Oxygen Activity Tolerance: Patient limited by pain Patient left: in bed;with call bell/phone within reach   PT Visit Diagnosis: Muscle weakness (generalized) (M62.81);Pain;Difficulty in walking, not elsewhere classified (R26.2) Pain - Right/Left: Left Pain - part of body:  (groin and lower back)    Time: 1610-9604 PT Time Calculation (min) (ACUTE ONLY): 22 min   Charges:   PT Evaluation $PT Eval Low Complexity: 1 Low  PT Treatments $Therapeutic Activity: 8-22 mins        Donna Bernard, PT, MPT   Ina Homes 09/24/2021, 2:03 PM

## 2021-09-24 NOTE — Progress Notes (Addendum)
PROGRESS NOTE    Margaret Frye  OZH:[id number] DOB: 1946/04/23 DOA: 09/23/2021 PCP: Marguarite Arbour, MD    Brief Narrative:  Margaret Frye is a 75 year old female with past medical history significant for type 2 diabetes mellitus, essential hypertension, TIA, asthma, migraine headache, hypothyroidism, hyperlipidemia, B12 deficiency, hx lumbar spondylolisthesis s/p laminectomy with fusion L2-L5 2018, Hx ACDF, chronic pain syndrome who presents to [facility] ED on 09/23/2021 with complaint of sudden onset left-sided lower back, lower extremity, groin pain.  Onset Tuesday, progressively worsening since.  Has not been controlled with her home pain medication/muscle relaxants.  Denies bowel/bladder dysfunction, no paresthesias to the groin area.  Denies weakness.  No fever/chills.  Denies any trauma/falls.  In the ED, temperature 98.2 F, HR 91, RR 16, BP 176/93, SPO2 99% on room air.  Sodium 135, potassium 3.7, chloride 97, CO2 27, BUN 18, creatinine 0.80, glucose 161.  WBC 7.3, hemoglobin 13.2, platelets 275.  Urinalysis unrevealing.  Covid-19 PCR negative.  Influenza A/B PCR negative.  Left hip x-ray negative for acute traumatic injury.  L-spine x-ray with no acute displaced fracture or traumatic listhesis of the lumbar spine, noted s/p L2-L5 posterior injured interbody fusion.  CT pelvis without contrast with mild degenerative changes hips but no fracture or AVN, no acute bony normalities, surrounding hip/pelvic musculature grossly normal with no obvious muscle tear or intramuscular hematoma.  CT L-spine with large left foraminal disc protrusion L1-2 affecting left L1 nerve root.  MR L-spine without contrast with left foraminal disc extrusion L1-2 impinging on the exiting L1 left nerve root.  Patient received Dilaudid, fentanyl and ketamine by EDP.  Started on Decadron.  Hospitalist service consulted for further evaluation and management of acute left lower extremity pain secondary to lumbar disc herniation with  impingement of left L1 nerve root.   Assessment & Plan:   Active Problems:   HTN (hypertension)   Hypothyroidism   Type II diabetes mellitus with complication (HCC)   Chronic, continuous use of opioids   Intractable low back pain   Lumbar herniated disc   History of lumbar laminectomy   Lumbar disc herniation with left L1 nerve root compression Intractable pain Hx chronic pain, opioid dependent Hx lumbar laminectomy L2-5 with fusion and ACDF Patient presenting to ED with progressive, intractable pain despite home opioids/muscle relaxants.  Denies any trauma/fall.  Imaging notable for left foraminal disc extrusion L1-2 impinging on the left L1 nerve root.  Evaluated by neurosurgery, Dr. Adriana Simas on 10/31 with recommendations of conservative management. --Decadron 4 mg IV q4h --Gabapentin 300 mg p.o. BID, 60 mg p.o. qHS --Oxycodone 10 mg PO q4h prn moderate pain --Dilaudid 1 mg IV q2h prn severe pain --IR consulted for consideration of foraminal steroid injection --PT/OT evaluation  Essential hypertension --Hydrochlorothiazide 25 mg p.o. daily --Metoprolol succinate 100 mg p.o. twice daily --Hydralazine  Hypothyroidism --Levothyroxine 50 mcg p.o. daily  Type 2 diabetes mellitus Hemoglobin A1c 7.9 on 06/05/2017.  On metformin 1000 mg p.o. twice daily at home. --SSI for coverage --CBGs qAC/HS  Chronic pain syndrome Home regimen includes gabapentin 300 mg twice daily and 600 mg nightly, Norco 10-325 mg every 6 hours as needed. --Continue home gabapentin --Increase Norco to 10-325 mg q4h prn moderate pain --Dilaudid as above  Insomnia --Trazodone 100 mg p.o. nightly  GERD: Continue PPI  Hx TIA: Continue aspirin and simvastatin   DVT prophylaxis: SCDs Start: 09/23/21 2138   Code Status: Full Code Family Communication: No family present at bedside this morning,  attempted to update patient's daughter Tresa Endo via telephone unsuccessful, voicemail left.  Disposition Plan:   Level of care: Med-Surg Status is: Inpatient  Remains inpatient appropriate because: IV pain control, IV steroids, IR for consideration of steroid injection   Consultants:  Neurosurgery  Procedures:  None  Antimicrobials:  None   Subjective: Patient seen examined bedside, resting comfortably.  Just received pain medications which is helping her symptoms.  Seen by neurosurgery this morning with recommendations are conservative measures, steroid taper, neuroforaminal steroid injection and PT/OT.  Patient continues with pain to her left lower extremity/groin area.  No other specific complaints or concerns at this time.  No family present at bedside this morning.  Patient denies headache, no dizziness, no chest pain, no palpitations, no shortness of breath, no abdominal pain, no weakness, no fatigue, no paresthesias.  No issues with bowel/bladder incontinence.  Denies saddle anesthesia.  No acute concerns overnight per nursing staff.  Objective: Vitals:   09/24/21 0300 09/24/21 0400 09/24/21 0500 09/24/21 0600  BP: (!) 176/77 (!) 167/73 (!) 165/71 (!) 169/81  Pulse: 63 63 64 67  Resp:  18  17  Temp:      TempSrc:      SpO2: 99% 98% 97% 100%  Weight:      Height:       No intake or output data in the 24 hours ending 09/24/21 1005 Filed Weights   09/23/21 1440  Weight: 91.2 kg    Examination:  General exam: Appears calm and comfortable  Respiratory system: Clear to auscultation. Respiratory effort normal.  On room air Cardiovascular system: S1 & S2 heard, RRR. No JVD, murmurs, rubs, gallops or clicks. No pedal edema. Gastrointestinal system: Abdomen is nondistended, soft and nontender. No organomegaly or masses felt. Normal bowel sounds heard. Central nervous system: Alert and oriented. No focal neurological deficits. Extremities: Symmetric 5 x 5 power. Skin: No rashes, lesions or ulcers Psychiatry: Judgement and insight appear normal. Mood & affect appropriate.   Neuro  Exam Mental Status: A&O x4, no dysarthria, no aphasia Cranial Nerves: visual fields full, PERRL, EOMi, intact smooth pursuit, no nystagmus, no ptosis, facial sensation intact bilaterally, 5/5 jaw strength, nasolabial fold & smile symetric,  eyebrow  raise & 5/5 eye closure symetric, hearing symmetric and normal to rubbing fingers, palate elevates symmetrically, head turning and shoulder shrug intact and symetric bilaterally, tongue protrusion is midline Motor: no pronator drift, LUE 5/5,   LLE 5/5,   RUE 5/5,   RLE 5/5   R. patellar 2+  R. achilles 2+           L. patellar 2+   L. achilles 2+     normal muscle bulk no significant atrophy, normal tone, no spasticity or rigidity apperciated  Sensory: Sensation is intact to light touch Coordination/Movement: no tremor noted, no dysmetria     Data Reviewed: I have personally reviewed following labs and imaging studies  CBC: Recent Labs  Lab 09/24/21 0614  WBC 7.3  NEUTROABS 5.5  HGB 13.2  HCT 39.4  MCV 90.6  PLT 275   Basic Metabolic Panel: Recent Labs  Lab 09/24/21 0614  NA 135  K 3.7  CL 97*  CO2 27  GLUCOSE 161*  BUN 18  CREATININE 0.80  CALCIUM 9.2   GFR: Estimated Creatinine Clearance: 68.5 mL/min (by C-G formula based on SCr of 0.8 mg/dL). Liver Function Tests: No results for input(s): AST, ALT, ALKPHOS, BILITOT, PROT, ALBUMIN in the last 168 hours. No results for  input(s): LIPASE, AMYLASE in the last 168 hours. No results for input(s): AMMONIA in the last 168 hours. Coagulation Profile: No results for input(s): INR, PROTIME in the last 168 hours. Cardiac Enzymes: No results for input(s): CKTOTAL, CKMB, CKMBINDEX, TROPONINI in the last 168 hours. BNP (last 3 results) No results for input(s): PROBNP in the last 8760 hours. HbA1C: No results for input(s): HGBA1C in the last 72 hours. CBG: Recent Labs  Lab 09/23/21 2217 09/23/21 2351 09/24/21 0338 09/24/21 0841  GLUCAP 133* 120* 181* 153*   Lipid  Profile: No results for input(s): CHOL, HDL, LDLCALC, TRIG, CHOLHDL, LDLDIRECT in the last 72 hours. Thyroid Function Tests: No results for input(s): TSH, T4TOTAL, FREET4, T3FREE, THYROIDAB in the last 72 hours. Anemia Panel: No results for input(s): VITAMINB12, FOLATE, FERRITIN, TIBC, IRON, RETICCTPCT in the last 72 hours. Sepsis Labs: No results for input(s): PROCALCITON, LATICACIDVEN in the last 168 hours.  Recent Results (from the past 240 hour(s))  Resp Panel by RT-PCR (Flu A&B, Covid) Nasopharyngeal Swab     Status: None   Collection Time: 09/23/21  9:27 PM   Specimen: Nasopharyngeal Swab; Nasopharyngeal(NP) swabs in vial transport medium  Result Value Ref Range Status   SARS Coronavirus 2 by RT PCR NEGATIVE NEGATIVE Final    Comment: (NOTE) SARS-CoV-2 target nucleic acids are NOT DETECTED.  The SARS-CoV-2 RNA is generally detectable in upper respiratory specimens during the acute phase of infection. The lowest concentration of SARS-CoV-2 viral copies this assay can detect is 138 copies/mL. A negative result does not preclude SARS-Cov-2 infection and should not be used as the sole basis for treatment or other patient management decisions. A negative result may occur with  improper specimen collection/handling, submission of specimen other than nasopharyngeal swab, presence of viral mutation(s) within the areas targeted by this assay, and inadequate number of viral copies(<138 copies/mL). A negative result must be combined with clinical observations, patient history, and epidemiological information. The expected result is Negative.  Fact Sheet for Patients:  BloggerCourse.com  Fact Sheet for Healthcare Providers:  SeriousBroker.it  This test is no t yet approved or cleared by the [place] FDA and  has been authorized for detection and/or diagnosis of SARS-CoV-2 by FDA under an Emergency Use Authorization (EUA). This EUA  will remain  in effect (meaning this test can be used) for the duration of the COVID-19 declaration under Section 564(b)(1) of the Act, 21 U.S.C.section 360bbb-3(b)(1), unless the authorization is terminated  or revoked sooner.       Influenza A by PCR NEGATIVE NEGATIVE Final   Influenza B by PCR NEGATIVE NEGATIVE Final    Comment: (NOTE) The Xpert Xpress SARS-CoV-2/FLU/RSV plus assay is intended as an aid in the diagnosis of influenza from Nasopharyngeal swab specimens and should not be used as a sole basis for treatment. Nasal washings and aspirates are unacceptable for Xpert Xpress SARS-CoV-2/FLU/RSV testing.  Fact Sheet for Patients: BloggerCourse.com  Fact Sheet for Healthcare Providers: SeriousBroker.it  This test is not yet approved or cleared by the [place] FDA and has been authorized for detection and/or diagnosis of SARS-CoV-2 by FDA under an Emergency Use Authorization (EUA). This EUA will remain in effect (meaning this test can be used) for the duration of the COVID-19 declaration under Section 564(b)(1) of the Act, 21 U.S.C. section 360bbb-3(b)(1), unless the authorization is terminated or revoked.  Performed at [facility], [address] [place], [place] [address]          Radiology Studies:  DG Lumbar Spine 2-3 Views  Result Date: 09/23/2021 CLINICAL DATA:  Hip and back pain.  History of lumbar fusion. EXAM: LUMBAR SPINE - 2-3 VIEW COMPARISON:  X-ray lumbar spine 09/03/2017 FINDINGS: Aplastic T12 ribs.  Five non-rib-bearing lumbar vertebral bodies. Prior L2 through L5 posterolateral and interbody fusion. Associated osseous fusion of the vertebral bodies laterally. There is no evidence of lumbar spine fracture. Alignment is grossly unremarkable with slight straightening of the normal lumbar lordosis likely due to surgical hardware fusion. Non-surgerized intervertebral disc spaces demonstrate  slightly increased narrowing. Associated multilevel facet arthropathy and mild osteophyte formation. IMPRESSION: No acute displaced fracture or traumatic listhesis of the lumbar spine in a patient status post L2 through L5 posterior interbody fusion. Electronically Signed   By: Tish Frederickson M.D.   On: 09/23/2021 15:26   CT Lumbar Spine Wo Contrast  Result Date: 09/23/2021 CLINICAL DATA:  Low back pain and left hip pain. History of prior lumbar surgery. EXAM: CT LUMBAR SPINE WITHOUT CONTRAST TECHNIQUE: Multidetector CT imaging of the lumbar spine was performed without intravenous contrast administration. Multiplanar CT image reconstructions were also generated. COMPARISON:  Lumbar spine MRI 07/07/2019 FINDINGS: Segmentation: There are five lumbar type vertebral bodies. The last full intervertebral disc space is labeled L5-S1. Alignment: Normal Vertebrae: No fracture or bone lesion. Moderate artifact associated with extensive fusion hardware with posterior and interbody fusion from L2-L5. I do not see any obvious complicating features associated with the hardware. No hardware fracture or loosening. There appears to be solid areas of interbody fusion at the disc space levels. Paraspinal and other soft tissues: No significant paraspinal or retroperitoneal findings. Disc levels: L1-2: No disc protrusions, focal moderate-sized left foraminal disc protrusion likely affecting the left L1 nerve root. This was not present on the prior MRI examination. L2-3: Posterior and interbody fusion changes. Wide decompressive laminectomy. No obvious spinal or foraminal stenosis. L3-4: Posterior and interbody fusion changes. Wide decompressive laminectomy. No obvious spinal or foraminal stenosis. L4-5: Posterior and interbody fusion changes. Wide decompressive laminectomy. No obvious spinal or foraminal stenosis. L5-S1: Moderate to advanced facet disease and 80 mild bulging annulus but no significant spinal or foraminal stenosis.  IMPRESSION: 1. New moderate to large left foraminal disc protrusion at L1-2 likely affecting the left L1 nerve root. 2. Posterior and interbody fusion changes from L2-L5 without obvious complicating features. There appears to be solid areas of interbody fusion at the disc space levels. Electronically Signed   By: Rudie Meyer M.D.   On: 09/23/2021 20:12   CT PELVIS WO CONTRAST  Result Date: 09/23/2021 CLINICAL DATA:  Left hip pain. EXAM: CT PELVIS WITHOUT CONTRAST TECHNIQUE: Multidetector CT imaging of the pelvis was performed following the standard protocol without intravenous contrast. COMPARISON:  None. FINDINGS: Both hips are normally located. Mild degenerative changes. No fracture or AVN. No hip joint effusion. No periarticular fluid collections. The pubic symphysis and SI joints are intact. No pelvic fractures or bone lesions. The surrounding hip and pelvic musculature are grossly normal by CT. No obvious muscle tear or intramuscular hematoma. No significant intrapelvic abnormalities are identified. No inguinal mass or hernia. IMPRESSION: 1. Mild degenerative changes involving the hips but no fracture or AVN. 2. No acute bony findings. 3. The surrounding hip and pelvic musculature are grossly normal by CT. No obvious muscle tear or intramuscular hematoma. Electronically Signed   By: Rudie Meyer M.D.   On: 09/23/2021 20:14   MR LUMBAR SPINE WO CONTRAST  Result Date: 09/24/2021 CLINICAL DATA:  Initial evaluation for low back pain, progressive neurologic deficit, prior surgery. Numbness and pain to the groin area and inner thighs for several days. EXAM: MRI LUMBAR SPINE WITHOUT CONTRAST TECHNIQUE: Multiplanar, multisequence MR imaging of the lumbar spine was performed. No intravenous contrast was administered. COMPARISON:  Prior CT from 09/23/2021 as well as previous MRI from 07/07/2019. FINDINGS: Segmentation: Standard. Lowest well-formed disc space labeled the L5-S1 level. Alignment: Trace chronic  retrolisthesis of L2 on L3. Alignment otherwise normal preservation of the normal lumbar lordosis. Vertebrae: Vertebral body height maintained without acute or chronic fracture. Postoperative changes from prior PLIF present at L2 through L5. Hardware better evaluated on prior CT. Underlying bone marrow signal intensity within normal limits. No discrete or worrisome osseous lesions. No abnormal marrow edema. Conus medullaris and cauda equina: Conus extends to the L1 level. Conus and cauda equina appear normal. No evidence for arachnoiditis. Paraspinal and other soft tissues: Postoperative changes from prior PLIF within the posterior paraspinous soft tissues. Small chronic postoperative collections about the laminectomy defects again seen, stable from prior MRI from 2020. No associated mass effect or inflammatory changes. Paraspinous soft tissues demonstrate no acute finding. Subcentimeter simple cyst partially visualize within the interpolar right kidney. Visualized visceral structures otherwise unremarkable. Disc levels: T11-12: Unremarkable. T12-L1: Unremarkable. L1-2: Disc desiccation. Left foraminal disc extrusion is seen, corresponding with abnormality on prior CT (series 5, image 14). Disc material impinges upon the exiting left L1 nerve root as it courses through the left neural foramen. Associated moderate left foraminal stenosis. Underlying mild facet hypertrophy. No spinal stenosis. Right neural foramina remains widely patent. L2-3:  Prior PLIF.  No residual or recurrent stenosis. L3-4:  Prior PLIF.  No residual or recurrent stenosis. L4-5:  Prior PLIF.  No residual or recurrent stenosis. L5-S1: Disc desiccation with small central disc protrusion. Moderate to severe bilateral facet hypertrophy with associated small joint effusions. Mild narrowing of the lateral recesses bilaterally with mild bilateral L5 foraminal stenosis. No frank impingement. Appearance is relatively stable from prior. IMPRESSION: 1.  Left foraminal disc extrusion at L1-2, impinging upon the exiting left L1 nerve root as it courses through the left neural foramen. Finding corresponds with abnormality on prior CT. 2. Postoperative changes from prior PLIF at L2 through L5 without residual or recurrent stenosis. 3. 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 No frank impingement. Electronically Signed   By: Rise Mu M.D.   On: 09/24/2021 03:26   DG Hip Unilat W or Wo Pelvis 2-3 Views Left  Result Date: 09/23/2021 CLINICAL DATA:  Hip and back pain.  History of lumbar fusion EXAM: DG HIP (WITH OR WITHOUT PELVIS) 2-3V LEFT COMPARISON:  None. FINDINGS: There is no evidence of hip fracture or dislocation. No acute displaced fracture or diastasis of the bones of the pelvis. There is no evidence of severe arthropathy or other focal bone abnormality. IMPRESSION: Negative for acute traumatic injury. Electronically Signed   By: Tish Frederickson M.D.   On: 09/23/2021 15:27        Scheduled Meds:  aspirin  81 mg Oral Daily   dexamethasone (DECADRON) injection  4 mg Intravenous Q4H   hydrochlorothiazide  25 mg Oral Daily   insulin aspart  0-15 Units Subcutaneous Q4H   levothyroxine  50 mcg Oral Q0600   simvastatin  40 mg Oral QHS   Continuous Infusions:   LOS: 1 day  Time spent: 39 minutes spent on chart review, discussion with nursing staff, consultants, updating family and interview/physical exam; more than 50% of that time was spent in counseling and/or coordination of care.    Alvira Philips [place], DO [facility] Available via Epic secure chat 7am-7pm After these hours, please refer to coverage provider listed on [email or web address] 09/24/2021, 10:05 AM

## 2021-09-24 NOTE — Progress Notes (Signed)
OT Cancellation Note  Patient Details Name: Margaret Frye MRN: [id number] DOB: 03/08/1946   Cancelled Treatment:    Reason Eval/Treat Not Completed: Pain limiting ability to participate. Consult received, chart reviewed. Spoke with PT who just evaluated pt. PT recommends holding OT evaluation at this time given significant pain despite pain medication recently. Will re-attempt next date and attempt to time evaluation with optimal pain control.   Arman Filter., MPH, MS, OTR/L ascom [phone] 09/24/21, 1:50 PM

## 2021-09-24 NOTE — ED Notes (Signed)
Patient pulse ox dropped to 88%, applied 2 liters of oxygen to keep above 90%.

## 2021-09-24 NOTE — Consult Note (Addendum)
Neurosurgery-New Consultation Evaluation 09/24/2021 Margaret Frye [id number]  Identifying Statement: Margaret Frye is a 75 y.o. female from [place] [address] with left groin pain  Physician Requesting Consultation: [facility]  History of Present Illness: Margaret Frye is here for evaluation of pain going into the left groin and numbness on the anterior thigh that she says started last week.  She does feel that she has had previous symptoms such as this months ago but it resolved on its own.  She states the pain is rather persistent in the left groin.  She does note that she was previously on gabapentin and did stop that this week.  She does have a chronic history of pain.  She has had a prior L2-L5 fusion done.  She denies any right leg symptoms.  She denies any symptoms in the left leg below the thigh.  Given the pain was unbearable, she presented to the emergency department where a MRI of the lumbar spine was obtained and we are evaluated for treatment options.  Past Medical History:  Past Medical History:  Diagnosis Date   Anemia    Arthritis    Asthma    Borderline glaucoma    Depression    Diabetes mellitus without complication (HCC)    Edema    Elevated liver enzymes    Environmental allergies    Family history of adverse reaction to anesthesia    Daughter had PONV   GERD (gastroesophageal reflux disease)    Headache    migraine   Heart palpitations    History of hiatal hernia    Hyperlipidemia    Hypertension    Hypothyroidism    Lumbar stenosis    PONV (postoperative nausea and vomiting)    Sleep apnea    does not wear CPAP " my oxygen level drops when I sleep"   TIA (transient ischemic attack)    Vitamin B deficiency    Vitamin D deficiency    Wears glasses    Wears partial dentures     Social History: Social History   Socioeconomic History   Marital status: Married    Spouse name: Not on file   Number of children: Not on file   Years of  education: Not on file   Highest education level: Not on file  Occupational History   Not on file  Tobacco Use   Smoking status: Never   Smokeless tobacco: Never  Vaping Use   Vaping Use: Never used  Substance and Sexual Activity   Alcohol use: No   Drug use: No   Sexual activity: Not on file  Other Topics Concern   Not on file  Social History Narrative   Not on file   Social Determinants of Health   Financial Resource Strain: Not on file  Food Insecurity: Not on file  Transportation Needs: Not on file  Physical Activity: Not on file  Stress: Not on file  Social Connections: Not on file  Intimate Partner Violence: Not on file   Living arrangements (living alone, with partner): Arrived with spouse  Family History: Family History  Problem Relation Age of Onset   Breast cancer Sister 14   Uterine cancer Mother    Diabetes Mother    Glaucoma Mother    Heart disease Father    Peripheral vascular disease Father     Review of Systems:  Review of Systems - General ROS: Negative Psychological ROS: Negative Ophthalmic ROS: Negative ENT ROS: Negative Hematological and  Lymphatic ROS: Negative  Endocrine ROS: Negative Respiratory ROS: Negative Cardiovascular ROS: Negative Gastrointestinal ROS: Negative Genito-Urinary ROS: Negative Musculoskeletal ROS: Negative Neurological ROS: Positive for leg pain, numbness Dermatological ROS: Negative  Physical Exam: BP (!) 169/81 (BP Location: Left Arm)   Pulse 67   Temp 98.7 F (37.1 C) (Oral)   Resp 17   Ht 5\' 4"  (1.626 m)   Wt 91.2 kg   SpO2 100%   BMI 34.50 kg/m  Body mass index is 34.5 kg/m. Body surface area is 2.03 meters squared. General appearance: Alert, cooperative, in no acute distress Head: Normocephalic, atraumatic Eyes: Normal, EOM intact Oropharynx: Moist without lesions Ext: No edema in LE bilaterally, warm extremities  Neurologic exam:  Mental status: alertness: alert, affect: normal Speech: fluent  and clear Motor:strength symmetric 5/5 in bilateral lower extremities in hip flexion, knee extension, dorsiflexion, plantarflexion Sensory: Decreased to light touch over the anterior left thigh and medial component Gait: Not tested  Laboratory: Results for orders placed or performed during the hospital encounter of 09/23/21  Resp Panel by RT-PCR (Flu A&B, Covid) Nasopharyngeal Swab   Specimen: Nasopharyngeal Swab; Nasopharyngeal(NP) swabs in vial transport medium  Result Value Ref Range   SARS Coronavirus 2 by RT PCR NEGATIVE NEGATIVE   Influenza A by PCR NEGATIVE NEGATIVE   Influenza B by PCR NEGATIVE NEGATIVE  Urinalysis, Routine w reflex microscopic  Result Value Ref Range   Color, Urine YELLOW (A) YELLOW   APPearance CLEAR (A) CLEAR   Specific Gravity, Urine 1.012 1.005 - 1.030   pH 5.0 5.0 - 8.0   Glucose, UA 50 (A) NEGATIVE mg/dL   Hgb urine dipstick NEGATIVE NEGATIVE   Bilirubin Urine NEGATIVE NEGATIVE   Ketones, ur NEGATIVE NEGATIVE mg/dL   Protein, ur NEGATIVE NEGATIVE mg/dL   Nitrite NEGATIVE NEGATIVE   Leukocytes,Ua NEGATIVE NEGATIVE  CBC with Differential/Platelet  Result Value Ref Range   WBC 7.3 4.0 - 10.5 K/uL   RBC 4.35 3.87 - 5.11 MIL/uL   Hemoglobin 13.2 12.0 - 15.0 g/dL   HCT 09/25/21 05.6 - 97.9 %   MCV 90.6 80.0 - 100.0 fL   MCH 30.3 26.0 - 34.0 pg   MCHC 33.5 30.0 - 36.0 g/dL   RDW 48.0 16.5 - 53.7 %   Platelets 275 150 - 400 K/uL   nRBC 0.0 0.0 - 0.2 %   Neutrophils Relative % 77 %   Neutro Abs 5.5 1.7 - 7.7 K/uL   Lymphocytes Relative 19 %   Lymphs Abs 1.4 0.7 - 4.0 K/uL   Monocytes Relative 3 %   Monocytes Absolute 0.2 0.1 - 1.0 K/uL   Eosinophils Relative 0 %   Eosinophils Absolute 0.0 0.0 - 0.5 K/uL   Basophils Relative 0 %   Basophils Absolute 0.0 0.0 - 0.1 K/uL   Immature Granulocytes 1 %   Abs Immature Granulocytes 0.06 0.00 - 0.07 K/uL  Basic metabolic panel  Result Value Ref Range   Sodium 135 135 - 145 mmol/L   Potassium 3.7 3.5 - 5.1  mmol/L   Chloride 97 (L) 98 - 111 mmol/L   CO2 27 22 - 32 mmol/L   Glucose, Bld 161 (H) 70 - 99 mg/dL   BUN 18 8 - 23 mg/dL   Creatinine, Ser 48.2 0.44 - 1.00 mg/dL   Calcium 9.2 8.9 - 7.07 mg/dL   GFR, Estimated 86.7 >54 mL/min   Anion gap 11 5 - 15  CBG monitoring, ED  Result Value Ref Range  Glucose-Capillary 133 (H) 70 - 99 mg/dL  CBG monitoring, ED  Result Value Ref Range   Glucose-Capillary 120 (H) 70 - 99 mg/dL  CBG monitoring, ED  Result Value Ref Range   Glucose-Capillary 181 (H) 70 - 99 mg/dL  CBG monitoring, ED  Result Value Ref Range   Glucose-Capillary 153 (H) 70 - 99 mg/dL   I personally reviewed labs  Imaging: MRI lumbar spine:1. Left foraminal disc extrusion at L1-2, impinging upon the exiting left L1 nerve root as it courses through the left neural foramen. Finding corresponds with abnormality on prior CT. 2. Postoperative changes from prior PLIF at L2 through L5 without residual or recurrent stenosis. 3. 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 No frank impingement.   Impression/Plan:  Ms. 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  This would affect the L1 nerve root and I do think this is causing her pain.  The numbness appears out of that distribution but I would assume that it is always caused by this foraminal disc.  It is small in nature and would recommend medical treatment first for a 3 to 4-week course to see if this helps resolve it.  She should continue on a steroid taper over the next week.  She did recently stop her gabapentin without a wean and I do think this is contributing to her severe pain and she should be restarted on her home gabapentin dose.  Can consider increase if not relieving pain.  Otherwise, would recommend follow-up with physiatry and physical therapy  as an outpatient to help guide treatment.  She would benefit from a foraminal steroid injection   1.  Diagnosis: Left L1 radiculopathy  2.  Plan -Recommend continuing steroid taper for a week -Restart gabapentin  -Follow-up with physiatry and physical therapy as an outpatient   Lucy Chris, MD Neurosurgery

## 2021-09-25 LAB — GLUCOSE, CAPILLARY
Glucose-Capillary: 217 mg/dL — ABNORMAL HIGH (ref 70–99)
Glucose-Capillary: 309 mg/dL — ABNORMAL HIGH (ref 70–99)
Glucose-Capillary: 310 mg/dL — ABNORMAL HIGH (ref 70–99)
Glucose-Capillary: 200 mg/dL — ABNORMAL HIGH (ref 70–99)

## 2021-09-25 MED ORDER — GABAPENTIN 300 MG PO CAPS
600.0000 mg | ORAL_CAPSULE | Freq: Two times a day (BID) | ORAL | Status: DC
  Administered 2021-09-25 – 2021-10-01 (×13): 600 mg via ORAL
  Filled 2021-09-25 (×13): qty 2

## 2021-09-25 MED ORDER — FENTANYL 50 MCG/HR TD PT72
1.0000 | MEDICATED_PATCH | TRANSDERMAL | Status: DC
Start: 2021-09-25 — End: 2021-10-01
  Administered 2021-09-25 – 2021-10-01 (×3): 1 via TRANSDERMAL
  Filled 2021-09-25 (×2): qty 1

## 2021-09-25 MED ORDER — INSULIN ASPART 100 UNIT/ML IJ SOLN
4.0000 [IU] | Freq: Three times a day (TID) | INTRAMUSCULAR | Status: DC
  Administered 2021-09-25 – 2021-09-26 (×3): 4 [IU] via SUBCUTANEOUS
  Filled 2021-09-25 (×3): qty 1

## 2021-09-25 MED ORDER — PREDNISONE 50 MG PO TABS
80.0000 mg | ORAL_TABLET | Freq: Every day | ORAL | Status: AC
  Administered 2021-09-25 – 2021-10-01 (×7): 80 mg via ORAL
  Filled 2021-09-25 (×7): qty 1

## 2021-09-25 NOTE — Progress Notes (Addendum)
PROGRESS NOTE    Margaret Frye  YWV:[id number] DOB: 1946/06/06 DOA: 09/23/2021 PCP: Marguarite Arbour, MD    Brief Narrative:  Margaret Frye is a 76 year old female with past medical history significant for type 2 diabetes mellitus, essential hypertension, TIA, asthma, migraine headache, hypothyroidism, hyperlipidemia, B12 deficiency, hx lumbar spondylolisthesis s/p laminectomy with fusion L2-L5 2018, Hx ACDF, chronic pain syndrome who presents to [facility] ED on 09/23/2021 with complaint of sudden onset left-sided lower back, lower extremity, groin pain.  Onset Tuesday, progressively worsening since.  Has not been controlled with her home pain medication/muscle relaxants.  Denies bowel/bladder dysfunction, no paresthesias to the groin area.  Denies weakness.  No fever/chills.  Denies any trauma/falls.  In the ED, temperature 98.2 F, HR 91, RR 16, BP 176/93, SPO2 99% on room air.  Sodium 135, potassium 3.7, chloride 97, CO2 27, BUN 18, creatinine 0.80, glucose 161.  WBC 7.3, hemoglobin 13.2, platelets 275.  Urinalysis unrevealing.  Covid-19 PCR negative.  Influenza A/B PCR negative.  Left hip x-ray negative for acute traumatic injury.  L-spine x-ray with no acute displaced fracture or traumatic listhesis of the lumbar spine, noted s/p L2-L5 posterior injured interbody fusion.  CT pelvis without contrast with mild degenerative changes hips but no fracture or AVN, no acute bony normalities, surrounding hip/pelvic musculature grossly normal with no obvious muscle tear or intramuscular hematoma.  CT L-spine with large left foraminal disc protrusion L1-2 affecting left L1 nerve root.  MR L-spine without contrast with left foraminal disc extrusion L1-2 impinging on the exiting L1 left nerve root.  Patient received Dilaudid, fentanyl and ketamine by EDP.  Started on Decadron.  Hospitalist service consulted for further evaluation and management of acute left lower extremity pain secondary to lumbar disc herniation with  impingement of left L1 nerve root.   Assessment & Plan:   Active Problems:   HTN (hypertension)   Hypothyroidism   Type II diabetes mellitus with complication (HCC)   Chronic, continuous use of opioids   Intractable low back pain   Lumbar herniated disc   History of lumbar laminectomy   Lumbar disc herniation with left L1 nerve root compression Intractable pain Hx chronic pain, opioid dependent Hx lumbar laminectomy L2-5 with fusion and ACDF Patient presenting to ED with progressive, intractable pain despite home opioids/muscle relaxants.  Denies any trauma/fall.  Imaging notable for left foraminal disc extrusion L1-2 impinging on the left L1 nerve root.  Evaluated by neurosurgery, Dr. Adriana Simas on 10/31 with recommendations of conservative management.  Consulted interventional radiology for neuroforaminal steroid injection, but unfortunately cannot accommodate. --Transition IV Decadron to prednisone 80 mg p.o. daily x 7 days with taper following response --start Fentanyl patch q72h --Gabapentin 600 mg p.o. BID, 600 mg p.o. qHS --Oxycodone 10 mg PO q4h prn moderate pain --Dilaudid 1 mg IV q2h prn severe pain --Continue PT/OT efforts while inpatient --Patient plans to follow-up neurosurgery outpatient once pain better controlled  Essential hypertension --Hydrochlorothiazide 25 mg p.o. daily --Metoprolol succinate 100 mg p.o. twice daily --Hydralazine 25mg  PO q6h prn SBP >170 or DBP >110  Hypothyroidism --Levothyroxine 50 mcg p.o. daily  Type 2 diabetes mellitus Hemoglobin A1c 6.8, well controlled.  On metformin 1000 mg p.o. twice daily at home. --Novolog 4u TID AC if eating greater than 50% of meals --SSI for coverage --CBGs qAC/HS  Chronic pain syndrome Home regimen includes gabapentin 300 mg twice daily and 600 mg nightly, Norco 10-325 mg every 6 hours as needed. --Started fentanyl patch  as above --Continue gabapentin --Norco to 10-325 mg q4h prn moderate  pain --Dilaudid as above  Insomnia --Trazodone 100 mg p.o. nightly  GERD: Continue PPI  Hx TIA: Continue aspirin and simvastatin   DVT prophylaxis: SCDs Start: 09/23/21 2138   Code Status: Full Code Family Communication: No family present at bedside this morning, attempted to update patient's daughter Tresa Endo via telephone unsuccessful.  Disposition Plan:  Level of care: Med-Surg Status is: Inpatient  Remains inpatient appropriate because: IV pain control, IV steroids, IR for consideration of steroid injection   Consultants:  Neurosurgery  Procedures:  None  Antimicrobials:  None   Subjective: Patient seen examined bedside, resting comfortably.  Eating breakfast.  Continues with pain, slightly improved since yesterday.  Utilized 7 mg of IV Ativan over the past 24 hours with 2 doses of oxycodone.  Discussed starting fentanyl patch for further pain relief and will transition IV Decadron to prednisone today.  States plans to follow-up with neurosurgery outpatient, Dr. Marcell Barlow.  No other specific complaints or concerns at this time.  No family present at bedside this morning.  Patient denies headache, no dizziness, no chest pain, no palpitations, no shortness of breath, no abdominal pain, no weakness, no fatigue, no paresthesias.  No issues with bowel/bladder incontinence.  Denies saddle anesthesia.  No acute concerns overnight per nursing staff.  Objective: Vitals:   09/25/21 0820 09/25/21 0926 09/25/21 0932 09/25/21 1202  BP: (!) 152/66   128/69  Pulse: (!) 57  64 (!) 56  Resp: 16   16  Temp: 97.8 F (36.6 C)   97.8 F (36.6 C)  TempSrc:    Oral  SpO2: 99% 98%  96%  Weight:      Height:        Intake/Output Summary (Last 24 hours) at 09/25/2021 1314 Last data filed at 09/25/2021 1030 Gross per 24 hour  Intake --  Output 550 ml  Net -550 ml   Filed Weights   09/23/21 1440  Weight: 91.2 kg    Examination:  General exam: Appears calm and comfortable   Respiratory system: Clear to auscultation. Respiratory effort normal.  On room air Cardiovascular system: S1 & S2 heard, RRR. No JVD, murmurs, rubs, gallops or clicks. No pedal edema. Gastrointestinal system: Abdomen is nondistended, soft and nontender. No organomegaly or masses felt. Normal bowel sounds heard. Central nervous system: Alert and oriented. No focal neurological deficits. Extremities: Symmetric 5 x 5 power. Skin: No rashes, lesions or ulcers Psychiatry: Judgement and insight appear normal. Mood & affect appropriate.   Neuro Exam Mental Status: A&O x4, no dysarthria, no aphasia Cranial Nerves: visual fields full, PERRL, EOMi, intact smooth pursuit, no nystagmus, no ptosis, facial sensation intact bilaterally, 5/5 jaw strength, nasolabial fold & smile symetric,  eyebrow  raise & 5/5 eye closure symetric, hearing symmetric and normal to rubbing fingers, palate elevates symmetrically, head turning and shoulder shrug intact and symetric bilaterally, tongue protrusion is midline Motor: no pronator drift, LUE 5/5,   LLE 5/5,   RUE 5/5,   RLE 5/5   R. patellar 2+  R. achilles 2+           L. patellar 2+   L. achilles 2+     normal muscle bulk no significant atrophy, normal tone, no spasticity or rigidity apperciated  Sensory: Sensation is intact to light touch Coordination/Movement: no tremor noted     Data Reviewed: I have personally reviewed following labs and imaging studies  CBC: Recent Labs  Lab  09/24/21 0614  WBC 7.3  NEUTROABS 5.5  HGB 13.2  HCT 39.4  MCV 90.6  PLT 275   Basic Metabolic Panel: Recent Labs  Lab 09/24/21 0614  NA 135  K 3.7  CL 97*  CO2 27  GLUCOSE 161*  BUN 18  CREATININE 0.80  CALCIUM 9.2   GFR: Estimated Creatinine Clearance: 68.5 mL/min (by C-G formula based on SCr of 0.8 mg/dL). Liver Function Tests: No results for input(s): AST, ALT, ALKPHOS, BILITOT, PROT, ALBUMIN in the last 168 hours. No results for input(s): LIPASE, AMYLASE in  the last 168 hours. No results for input(s): AMMONIA in the last 168 hours. Coagulation Profile: No results for input(s): INR, PROTIME in the last 168 hours. Cardiac Enzymes: No results for input(s): CKTOTAL, CKMB, CKMBINDEX, TROPONINI in the last 168 hours. BNP (last 3 results) No results for input(s): PROBNP in the last 8760 hours. HbA1C: Recent Labs    09/24/21 0614  HGBA1C 6.8*   CBG: Recent Labs  Lab 09/24/21 1238 09/24/21 1802 09/24/21 2312 09/25/21 0754 09/25/21 1208  GLUCAP 233* 247* 193* 217* 309*   Lipid Profile: No results for input(s): CHOL, HDL, LDLCALC, TRIG, CHOLHDL, LDLDIRECT in the last 72 hours. Thyroid Function Tests: No results for input(s): TSH, T4TOTAL, FREET4, T3FREE, THYROIDAB in the last 72 hours. Anemia Panel: No results for input(s): VITAMINB12, FOLATE, FERRITIN, TIBC, IRON, RETICCTPCT in the last 72 hours. Sepsis Labs: No results for input(s): PROCALCITON, LATICACIDVEN in the last 168 hours.  Recent Results (from the past 240 hour(s))  Resp Panel by RT-PCR (Flu A&B, Covid) Nasopharyngeal Swab     Status: None   Collection Time: 09/23/21  9:27 PM   Specimen: Nasopharyngeal Swab; Nasopharyngeal(NP) swabs in vial transport medium  Result Value Ref Range Status   SARS Coronavirus 2 by RT PCR NEGATIVE NEGATIVE Final    Comment: (NOTE) SARS-CoV-2 target nucleic acids are NOT DETECTED.  The SARS-CoV-2 RNA is generally detectable in upper respiratory specimens during the acute phase of infection. The lowest concentration of SARS-CoV-2 viral copies this assay can detect is 138 copies/mL. A negative result does not preclude SARS-Cov-2 infection and should not be used as the sole basis for treatment or other patient management decisions. A negative result may occur with  improper specimen collection/handling, submission of specimen other than nasopharyngeal swab, presence of viral mutation(s) within the areas targeted by this assay, and inadequate  number of viral copies(<138 copies/mL). A negative result must be combined with clinical observations, patient history, and epidemiological information. The expected result is Negative.  Fact Sheet for Patients:  BloggerCourse.com  Fact Sheet for Healthcare Providers:  SeriousBroker.it  This test is no t yet approved or cleared by the [place] FDA and  has been authorized for detection and/or diagnosis of SARS-CoV-2 by FDA under an Emergency Use Authorization (EUA). This EUA will remain  in effect (meaning this test can be used) for the duration of the COVID-19 declaration under Section 564(b)(1) of the Act, 21 U.S.C.section 360bbb-3(b)(1), unless the authorization is terminated  or revoked sooner.       Influenza A by PCR NEGATIVE NEGATIVE Final   Influenza B by PCR NEGATIVE NEGATIVE Final    Comment: (NOTE) The Xpert Xpress SARS-CoV-2/FLU/RSV plus assay is intended as an aid in the diagnosis of influenza from Nasopharyngeal swab specimens and should not be used as a sole basis for treatment. Nasal washings and aspirates are unacceptable for Xpert Xpress SARS-CoV-2/FLU/RSV testing.  Fact Sheet for Patients: BloggerCourse.com  Fact Sheet for Healthcare Providers: SeriousBroker.it  This test is not yet approved or cleared by the [place] FDA and has been authorized for detection and/or diagnosis of SARS-CoV-2 by FDA under an Emergency Use Authorization (EUA). This EUA will remain in effect (meaning this test can be used) for the duration of the COVID-19 declaration under Section 564(b)(1) of the Act, 21 U.S.C. section 360bbb-3(b)(1), unless the authorization is terminated or revoked.  Performed at [facility], [address], [place], [place] [address]          Radiology Studies: DG Lumbar Spine 2-3 Views  Result Date: 09/23/2021 CLINICAL DATA:   Hip and back pain.  History of lumbar fusion. EXAM: LUMBAR SPINE - 2-3 VIEW COMPARISON:  X-ray lumbar spine 09/03/2017 FINDINGS: Aplastic T12 ribs.  Five non-rib-bearing lumbar vertebral bodies. Prior L2 through L5 posterolateral and interbody fusion. Associated osseous fusion of the vertebral bodies laterally. There is no evidence of lumbar spine fracture. Alignment is grossly unremarkable with slight straightening of the normal lumbar lordosis likely due to surgical hardware fusion. Non-surgerized intervertebral disc spaces demonstrate slightly increased narrowing. Associated multilevel facet arthropathy and mild osteophyte formation. IMPRESSION: No acute displaced fracture or traumatic listhesis of the lumbar spine in a patient status post L2 through L5 posterior interbody fusion. Electronically Signed   By: Tish Frederickson M.D.   On: 09/23/2021 15:26   CT Lumbar Spine Wo Contrast  Result Date: 09/23/2021 CLINICAL DATA:  Low back pain and left hip pain. History of prior lumbar surgery. EXAM: CT LUMBAR SPINE WITHOUT CONTRAST TECHNIQUE: Multidetector CT imaging of the lumbar spine was performed without intravenous contrast administration. Multiplanar CT image reconstructions were also generated. COMPARISON:  Lumbar spine MRI 07/07/2019 FINDINGS: Segmentation: There are five lumbar type vertebral bodies. The last full intervertebral disc space is labeled L5-S1. Alignment: Normal Vertebrae: No fracture or bone lesion. Moderate artifact associated with extensive fusion hardware with posterior and interbody fusion from L2-L5. I do not see any obvious complicating features associated with the hardware. No hardware fracture or loosening. There appears to be solid areas of interbody fusion at the disc space levels. Paraspinal and other soft tissues: No significant paraspinal or retroperitoneal findings. Disc levels: L1-2: No disc protrusions, focal moderate-sized left foraminal disc protrusion likely affecting the  left L1 nerve root. This was not present on the prior MRI examination. L2-3: Posterior and interbody fusion changes. Wide decompressive laminectomy. No obvious spinal or foraminal stenosis. L3-4: Posterior and interbody fusion changes. Wide decompressive laminectomy. No obvious spinal or foraminal stenosis. L4-5: Posterior and interbody fusion changes. Wide decompressive laminectomy. No obvious spinal or foraminal stenosis. L5-S1: Moderate to advanced facet disease and 80 mild bulging annulus but no significant spinal or foraminal stenosis. IMPRESSION: 1. New moderate to large left foraminal disc protrusion at L1-2 likely affecting the left L1 nerve root. 2. Posterior and interbody fusion changes from L2-L5 without obvious complicating features. There appears to be solid areas of interbody fusion at the disc space levels. Electronically Signed   By: Rudie Meyer M.D.   On: 09/23/2021 20:12   CT PELVIS WO CONTRAST  Result Date: 09/23/2021 CLINICAL DATA:  Left hip pain. EXAM: CT PELVIS WITHOUT CONTRAST TECHNIQUE: Multidetector CT imaging of the pelvis was performed following the standard protocol without intravenous contrast. COMPARISON:  None. FINDINGS: Both hips are normally located. Mild degenerative changes. No fracture or AVN. No hip joint effusion. No periarticular fluid collections. The pubic symphysis and SI joints are intact. No pelvic  fractures or bone lesions. The surrounding hip and pelvic musculature are grossly normal by CT. No obvious muscle tear or intramuscular hematoma. No significant intrapelvic abnormalities are identified. No inguinal mass or hernia. IMPRESSION: 1. Mild degenerative changes involving the hips but no fracture or AVN. 2. No acute bony findings. 3. The surrounding hip and pelvic musculature are grossly normal by CT. No obvious muscle tear or intramuscular hematoma. Electronically Signed   By: Rudie Meyer M.D.   On: 09/23/2021 20:14   MR LUMBAR SPINE WO CONTRAST  Result  Date: 09/24/2021 CLINICAL DATA:  Initial evaluation for low back pain, progressive neurologic deficit, prior surgery. Numbness and pain to the groin area and inner thighs for several days. EXAM: MRI LUMBAR SPINE WITHOUT CONTRAST TECHNIQUE: Multiplanar, multisequence MR imaging of the lumbar spine was performed. No intravenous contrast was administered. COMPARISON:  Prior CT from 09/23/2021 as well as previous MRI from 07/07/2019. FINDINGS: Segmentation: Standard. Lowest well-formed disc space labeled the L5-S1 level. Alignment: Trace chronic retrolisthesis of L2 on L3. Alignment otherwise normal preservation of the normal lumbar lordosis. Vertebrae: Vertebral body height maintained without acute or chronic fracture. Postoperative changes from prior PLIF present at L2 through L5. Hardware better evaluated on prior CT. Underlying bone marrow signal intensity within normal limits. No discrete or worrisome osseous lesions. No abnormal marrow edema. Conus medullaris and cauda equina: Conus extends to the L1 level. Conus and cauda equina appear normal. No evidence for arachnoiditis. Paraspinal and other soft tissues: Postoperative changes from prior PLIF within the posterior paraspinous soft tissues. Small chronic postoperative collections about the laminectomy defects again seen, stable from prior MRI from 2020. No associated mass effect or inflammatory changes. Paraspinous soft tissues demonstrate no acute finding. Subcentimeter simple cyst partially visualize within the interpolar right kidney. Visualized visceral structures otherwise unremarkable. Disc levels: T11-12: Unremarkable. T12-L1: Unremarkable. L1-2: Disc desiccation. Left foraminal disc extrusion is seen, corresponding with abnormality on prior CT (series 5, image 14). Disc material impinges upon the exiting left L1 nerve root as it courses through the left neural foramen. Associated moderate left foraminal stenosis. Underlying mild facet hypertrophy. No  spinal stenosis. Right neural foramina remains widely patent. L2-3:  Prior PLIF.  No residual or recurrent stenosis. L3-4:  Prior PLIF.  No residual or recurrent stenosis. L4-5:  Prior PLIF.  No residual or recurrent stenosis. L5-S1: Disc desiccation with small central disc protrusion. Moderate to severe bilateral facet hypertrophy with associated small joint effusions. Mild narrowing of the lateral recesses bilaterally with mild bilateral L5 foraminal stenosis. No frank impingement. Appearance is relatively stable from prior. IMPRESSION: 1. Left foraminal disc extrusion at L1-2, impinging upon the exiting left L1 nerve root as it courses through the left neural foramen. Finding corresponds with abnormality on prior CT. 2. Postoperative changes from prior PLIF at L2 through L5 without residual or recurrent stenosis. 3. 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 No frank impingement. Electronically Signed   By: Rise Mu M.D.   On: 09/24/2021 03:26   DG Hip Unilat W or Wo Pelvis 2-3 Views Left  Result Date: 09/23/2021 CLINICAL DATA:  Hip and back pain.  History of lumbar fusion EXAM: DG HIP (WITH OR WITHOUT PELVIS) 2-3V LEFT COMPARISON:  None. FINDINGS: There is no evidence of hip fracture or dislocation. No acute displaced fracture or diastasis of the bones of the pelvis. There is no evidence  of severe arthropathy or other focal bone abnormality. IMPRESSION: Negative for acute traumatic injury. Electronically Signed   By: Tish Frederickson M.D.   On: 09/23/2021 15:27        Scheduled Meds:  aspirin  81 mg Oral Daily   fentaNYL  1 patch Transdermal Q72H   gabapentin  600 mg Oral QHS   gabapentin  600 mg Oral BID   hydrochlorothiazide  25 mg Oral Daily   insulin aspart  0-15 Units Subcutaneous TID WC   insulin aspart  0-5 Units Subcutaneous QHS    insulin aspart  4 Units Subcutaneous TID WC   levothyroxine  50 mcg Oral Q0600   metoprolol succinate  100 mg Oral BID   pantoprazole  80 mg Oral Daily   predniSONE  80 mg Oral Q breakfast   simvastatin  40 mg Oral QHS   traZODone  100 mg Oral QHS   Continuous Infusions:   LOS: 2 days    Time spent: 39 minutes spent on chart review, discussion with nursing staff, consultants, updating family and interview/physical exam; more than 50% of that time was spent in counseling and/or coordination of care.    Alvira Philips [place], DO [facility] Available via Epic secure chat 7am-7pm After these hours, please refer to coverage provider listed on [email or web address] 09/25/2021, 1:14 PM

## 2021-09-25 NOTE — Evaluation (Signed)
Occupational Therapy Evaluation Patient Details Name: Margaret Frye MRN: [id number] DOB: 03-06-1946 Today's Date: 09/25/2021   History of Present Illness Patient is a 75 year old female presenting with progressive, intractable pain despite home opioids/muscle relaxants without trauma/fall. Found to have .foraminal disc herniation at the left L1-2 level, left L1 radiculopathy with recommendation for conservative management. 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   Clinical Impression   Chart reviewed, RN cleared pt for participation in OT tx session stating pt had received pain meds prior to session. Pt greeted in bed, agreeable to OT evaluation. Pt is alert and oriented x4. PTA pt was MOD I-INDEPENDENT in all ADL/IADL, started using a RW approx 1 week PTA due to lower back pain. Pt performs seated ADLs with SET UP, peri care with MOD A, LB dressing with MAX A. Supine>sit completed with SUP and HOB raised, STS 3x with CGA, fxl ambulation ~10 feet around bed to bedside chair with Close SUP, transfer into bedside chair with RW with Close SUP. Demonstration and education re: use of AE for ADL completion to for pain management. Pt accepted all information, however would benefit from ongoing education re: techniques. Pt also provided education on appropriate body mechanics for functional tasks. Pt is performing below PLOF, would benefit from ongoing OT services to address functional deficits and to improve safe ADL/IADL completion.  Pt is left  in bedside chair, NAD, all needs met. RN and NT notified of pt status. OT will continue to follow while admitted. Recommend discharge home with [place].     Recommendations for follow up therapy are one component of a multi-disciplinary discharge planning  process, led by the attending physician.  Recommendations may be updated based on patient status, additional functional criteria and insurance authorization.   Follow Up Recommendations  Home health OT    Assistance Recommended at Discharge Intermittent Supervision/Assistance  Functional Status Assessment  Patient has had a recent decline in their functional status and demonstrates the ability to make significant improvements in function in a reasonable and predictable amount of time.  Equipment Recommendations  Tub/shower seat    Recommendations for Other Services       Precautions / Restrictions Precautions Precautions: Fall Restrictions Weight Bearing Restrictions: No      Mobility Bed Mobility Overal bed mobility: Needs Assistance Bed Mobility: Supine to Sit     Supine to sit: Supervision;HOB elevated     General bed mobility comments: Increased time, intermittent vcs for technique    Transfers Overall transfer level: Needs assistance Equipment used: Rolling walker (2 wheels) Transfers: Sit to/from [employer] Transfers Sit to Stand: Min guard Stand pivot transfers: Supervision                Balance Overall balance assessment: Needs assistance Sitting-balance support: Feet supported;No upper extremity supported Sitting balance-Leahy Scale: Good     Standing balance support: Bilateral upper extremity supported Standing balance-Leahy Scale: Fair                             ADL either performed or assessed with clinical judgement   ADL Overall ADL's : Needs assistance/impaired Eating/Feeding: Set up   Grooming: Wash/dry hands;Wash/dry face;Oral care;Set up;Brushing hair;Sitting Grooming Details (indicate cue type and reason): sitting due to pain Upper Body Bathing: Set up;Sitting  Upper Body Bathing Details (indicate cue type and reason): at edge of bed due to pain Lower Body Bathing: Minimal assistance;Sit to/from stand Lower Body  Bathing Details (indicate cue type and reason): due to pain Upper Body Dressing : Minimal assistance   Lower Body Dressing: Maximal assistance Lower Body Dressing Details (indicate cue type and reason): due to pain Toilet Transfer: Supervision/safety Toilet Transfer Details (indicate cue type and reason): simulated Toileting- Clothing Manipulation and Hygiene: Moderate assistance;Sit to/from stand Toileting - Clothing Manipulation Details (indicate cue type and reason): for peri care in standing     Functional mobility during ADLs: Supervision/safety;Rolling walker (2 wheels) General ADL Comments: pain limiting further mobility- pt performs fxl ambulation approx 10 feet around bed to bedside chair     Vision Baseline Vision/History: 1 Wears glasses Patient Visual Report: No change from baseline       Perception     Praxis      Pertinent Vitals/Pain Pain Assessment: 0-10 Pain Score: 4  Pain Location: left groin and lower back (10/10 pain with activity) Pain Descriptors / Indicators: Discomfort Pain Intervention(s): Limited activity within patient's tolerance;Premedicated before session;Monitored during session     Hand Dominance     Extremity/Trunk Assessment Upper Extremity Assessment Upper Extremity Assessment: Overall WFL for tasks assessed   Lower Extremity Assessment LLE Sensation: decreased light touch       Communication Communication Communication: No difficulties   Cognition Arousal/Alertness: Awake/alert Behavior During Therapy: WFL for tasks assessed/performed Overall Cognitive Status: Within Functional Limits for tasks assessed                                 General Comments: A&Ox4, pleasant and agreeable     General Comments  activity limited due to reported pain, however pt reports increased tolerance for activity as compared to yesterday    Exercises Other Exercises Other Exercises: education and demonstration re: use of AE (long  handled shoe horn, sock aid, reacher), body mechanics for decreased pain, improved ADL performance   Shoulder Instructions      Home Living Family/patient expects to be discharged to:: Private residence Living Arrangements: Spouse/significant other Available Help at Discharge: Family Type of Home: House Home Access: Stairs to enter [employer] of Steps: front door: 1; back door 3 Entrance Stairs-Rails: Can reach both (back stairs with B rails, front stairs no rails) Home Layout: Two level;Bed/bath upstairs Alternate Level Stairs-Number of Steps: flight of chairs- pt has chair lift up stairs that she utilizes   [employer] Shower/Tub: Occupational psychologist: Standard     Home Equipment: Conservation officer, nature (2 wheels);Shower seat;Cane - quad          Prior Functioning/Environment Prior Level of Function : Independent/Modified Independent;Driving             Mobility Comments: I with no AD ADLs Comments: MOD I- I in all ADL PTA        OT Problem List: Impaired balance (sitting and/or standing);Decreased activity tolerance;Decreased knowledge of use of DME or AE      OT Treatment/Interventions: Self-care/ADL training;DME and/or AE instruction;Therapeutic activities;Balance training;Therapeutic exercise;Patient/family education    OT Goals(Current goals can be found in the care plan section) Acute Rehab OT Goals Patient Stated Goal: decrease pain OT Goal Formulation: With patient Time For Goal Achievement: 10/09/21 Potential to Achieve Goals: Good ADL Goals Pt Will Perform Grooming: with modified independence;standing Pt Will Perform Lower Body  Dressing: with modified independence Pt Will Transfer to Toilet: with modified independence Additional ADL Goal #1: Pt will improve independence ADL completion as evidenced by select approrpiate AE, as needed, with no vcs required  OT Frequency: Min 1X/week   Barriers to D/C:            Co-evaluation               AM-PAC OT "6 Clicks" Daily Activity     Outcome Measure Help from another person eating meals?: None Help from another person taking care of personal grooming?: None Help from another person toileting, which includes using toliet, bedpan, or urinal?: A Little Help from another person bathing (including washing, rinsing, drying)?: A Little Help from another person to put on and taking off regular upper body clothing?: None Help from another person to put on and taking off regular lower body clothing?: A Lot 6 Click Score: 20   End of Session Equipment Utilized During Treatment: Gait belt;Rolling walker (2 wheels) Nurse Communication: Mobility status  Activity Tolerance: Patient tolerated treatment well;Patient limited by pain Patient left: in chair;with call bell/phone within reach;with chair alarm set  OT Visit Diagnosis: Unsteadiness on feet (R26.81);Other abnormalities of gait and mobility (R26.89)                Time: 0165-8006 OT Time Calculation (min): 30 min Charges:  OT General Charges $OT Visit: 1 Visit OT Treatments $Self Care/Home Management : 23-37 mins  Shanon Payor, OTD OTR/L  09/25/21, 9:42 AM

## 2021-09-25 NOTE — Progress Notes (Signed)
Inpatient Diabetes Program Recommendations  AACE/ADA: New Consensus Statement on Inpatient Glycemic Control (2015)  Target Ranges:  Prepandial:   less than 140 mg/dL      Peak postprandial:   less than 180 mg/dL (1-2 hours)      Critically ill patients:  140 - 180 mg/dL   Lab Results  Component Value Date   GLUCAP 309 (H) 09/25/2021   HGBA1C 6.8 (H) 09/24/2021    Review of Glycemic Control Results for Margaret Frye, Margaret Frye (MRN [id number]) as of 09/25/2021 13:05  Ref. Range 09/24/2021 12:38 09/24/2021 18:02 09/24/2021 23:12 09/25/2021 07:54 09/25/2021 12:08  Glucose-Capillary Latest Ref Range: 70 - 99 mg/dL 518 (H) 841 (H) 660 (H) 217 (H) 309 (H)   Diabetes history: DM 2 Outpatient Diabetes medications:  Metformin 1000 mg bid Current orders for Inpatient glycemic control:  Novolog moderate tid with meals and HS Prednisone 80 mg q AM Inpatient Diabetes Program Recommendations:   Consider adding Novolog meal coverage 4 units tid with meals (hold if patient eats less than 50% or NPO) while on Prednisone.    Thanks,  Beryl Meager, RN, BC-ADM Inpatient Diabetes Coordinator Pager [phone]  (8a-5p)

## 2021-09-25 NOTE — Progress Notes (Signed)
Physical Therapy Treatment Patient Details Name: Margaret Frye MRN: [id number] DOB: [birth year] Today's Date: 09/25/2021   History of Present Illness Patient is a 75 year old female presenting with progressive, intractable pain despite home opioids/muscle relaxants without trauma/fall. Found to have .foraminal disc herniation at the left L1-2 level, left L1 radiculopathy with recommendation for conservative management. 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    PT Comments    Patient agreeable to bed level activity but declined getting back out of bed due to groin pain. Patient reports she walked in the room with staff assistance earlier today. Patient participate with therapeutic exercise of LLE. Educated provided for positioning techniques to alleviate pain, logroll bed mobility techniques, LE exercises for strengthening, using rolling walker for ambulation as needed, and progressing activity slowly. Anticipate patient will be able to discharge home with family support. PT will continue to follow.    Recommendations for follow up therapy are one component of a multi-disciplinary discharge planning process, led by the attending physician.  Recommendations may be updated based on patient status, additional functional criteria and insurance authorization.  Follow Up Recommendations  Home health PT     Assistance Recommended at Discharge Intermittent Supervision/Assistance  Equipment Recommendations  None recommended by PT    Recommendations for Other Services       Precautions / Restrictions Precautions Precautions: Fall Restrictions Weight Bearing Restrictions: No     Mobility  Bed Mobility               General bed mobility comments: patient declined getting  out of bed at this time due to groin pain. encouraged patient to get out of bed to chair and ambualte with staff assistance to bathroom as needed with rolling walker    Transfers                        Ambulation/Gait                 Stairs             Wheelchair Mobility    Modified Rankin (Stroke Patients Only)       Balance                                            Cognition Arousal/Alertness: Awake/alert Behavior During Therapy: WFL for tasks assessed/performed Overall Cognitive Status: Within Functional Limits for tasks assessed                                          Exercises General Exercises - Lower Extremity Ankle Circles/Pumps: AAROM;Strengthening;Left;10 reps;Supine Short Arc Quad: AAROM;Strengthening;Left;10 reps;Supine Hip ABduction/ADduction: AAROM;Strengthening;Left;10 reps;Supine Other Exercises Other Exercises: verbal and tactile cues for exercise technqiue for LLE therapeutic exercises    General Comments General comments (skin integrity, edema, etc.): patient educated on positioning techniques in bed to alleviate pain, using logroll technique for bed mobility for pain control, and progressing activity slowly with use of assistive device with ambulation for safety and fall prevention.      Pertinent Vitals/Pain Pain Assessment: Faces Faces Pain Scale: Hurts even more Pain  Location: left groin Pain Descriptors / Indicators: Discomfort Pain Intervention(s): Limited activity within patient's tolerance;Ice applied    Home Living                          Prior Function            PT Goals (current goals can now be found in the care plan section) Acute Rehab PT Goals Patient Stated Goal: to go home when pain controlled PT Goal Formulation: With patient Time For Goal Achievement: 10/08/21 Potential to Achieve Goals: Good Progress towards PT goals: Progressing toward goals     Frequency    Min 2X/week      PT Plan Current plan remains appropriate    Co-evaluation              AM-PAC PT "6 Clicks" Mobility   Outcome Measure  Help needed turning from your back to your side while in a flat bed without using bedrails?: None Help needed moving from lying on your back to sitting on the side of a flat bed without using bedrails?: A Little Help needed moving to and from a bed to a chair (including a wheelchair)?: A Little Help needed standing up from a chair using your arms (e.g., wheelchair or bedside chair)?: A Little Help needed to walk in hospital room?: A Little Help needed climbing 3-5 steps with a railing? : A Little 6 Click Score: 19    End of Session   Activity Tolerance: Patient limited by pain Patient left: in bed;with call bell/phone within reach;with bed alarm set;with family/visitor present (ice pack applied to left groin)   PT Visit Diagnosis: Muscle weakness (generalized) (M62.81);Pain;Difficulty in walking, not elsewhere classified (R26.2) Pain - Right/Left: Left Pain - part of body:  (groin)     Time: 1884-1660 PT Time Calculation (min) (ACUTE ONLY): 32 min  Charges:  $Therapeutic Exercise: 8-22 mins $Therapeutic Activity: 8-22 mins                     Donna Bernard, PT, MPT    Margaret Frye 09/25/2021, 3:33 PM

## 2021-09-26 DIAGNOSIS — K5903 Drug induced constipation: Secondary | ICD-10-CM

## 2021-09-26 LAB — GLUCOSE, CAPILLARY
Glucose-Capillary: 160 mg/dL — ABNORMAL HIGH (ref 70–99)
Glucose-Capillary: 290 mg/dL — ABNORMAL HIGH (ref 70–99)
Glucose-Capillary: 293 mg/dL — ABNORMAL HIGH (ref 70–99)
Glucose-Capillary: 274 mg/dL — ABNORMAL HIGH (ref 70–99)

## 2021-09-26 MED ORDER — LACTULOSE 10 GM/15ML PO SOLN
20.0000 g | Freq: Two times a day (BID) | ORAL | Status: DC
  Administered 2021-09-26: 20 g via ORAL
  Filled 2021-09-26 (×3): qty 30

## 2021-09-26 MED ORDER — DOCUSATE SODIUM 100 MG PO CAPS
200.0000 mg | ORAL_CAPSULE | Freq: Two times a day (BID) | ORAL | Status: DC
  Administered 2021-09-26 – 2021-10-01 (×11): 200 mg via ORAL
  Filled 2021-09-26 (×11): qty 2

## 2021-09-26 MED ORDER — INSULIN ASPART 100 UNIT/ML IJ SOLN
8.0000 [IU] | Freq: Three times a day (TID) | INTRAMUSCULAR | Status: DC
Start: 2021-09-26 — End: 2021-10-01
  Administered 2021-09-26 – 2021-10-01 (×15): 8 [IU] via SUBCUTANEOUS
  Filled 2021-09-26 (×14): qty 1

## 2021-09-26 MED ORDER — ENOXAPARIN SODIUM 60 MG/0.6ML IJ SOSY
0.5000 mg/kg | PREFILLED_SYRINGE | INTRAMUSCULAR | Status: DC
  Administered 2021-09-26 – 2021-09-30 (×5): 45 mg via SUBCUTANEOUS
  Filled 2021-09-26 (×5): qty 0.6

## 2021-09-26 NOTE — Progress Notes (Signed)
PHARMACIST - PHYSICIAN COMMUNICATION  CONCERNING:  Enoxaparin (Lovenox) for DVT Prophylaxis    RECOMMENDATION: Patient was prescribed enoxaparin 40mg  q24 hours for VTE prophylaxis.   Filed Weights   09/23/21 1440  Weight: 91.2 kg (201 lb)    Body mass index is 34.5 kg/m.  Estimated Creatinine Clearance: 68.5 mL/min (by C-G formula based on SCr of 0.8 mg/dL).   Based on [facility] policy patient is candidate for enoxaparin 0.5mg /kg TBW SQ every 24 hours based on BMI being >30.  DESCRIPTION: Pharmacy has adjusted enoxaparin dose per [facility] policy.  Patient is now receiving enoxaparin 45 mg every 24 hours    [facility] 09/26/2021 3:21 PM

## 2021-09-26 NOTE — Progress Notes (Signed)
Occupational Therapy Treatment Patient Details Name: Margaret Frye MRN: [id number] DOB: 01/03/1946 Today's Date: 09/26/2021   History of present illness Patient is a 75 year old female presenting with progressive, intractable pain despite home opioids/muscle relaxants without trauma/fall. Found to have .foraminal disc herniation at the left L1-2 level, left L1 radiculopathy with recommendation for conservative management. 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   OT comments  Chart reviewed, RN cleared pt for participation in OT tx session. Pt greeted in bed, agreeable to session. Tx session targeted progressing fxl mobility for improved independence in ADL completion to facilitate return to PLOF. Pt is progressing towards goals, of note was able to tolerate grooming in standing on this date. Pt educated on AE, compensatory techniques for decreased pain, good carry over noted. Pt is left as received, NAD, all needs met. RN aware of pt status. OT will continue to follow.    Recommendations for follow up therapy are one component of a multi-disciplinary discharge planning process, led by the attending physician.  Recommendations may be updated based on patient status, additional functional criteria and insurance authorization.    Follow Up Recommendations  Home health OT    Assistance Recommended at Discharge Intermittent Supervision/Assistance  Equipment Recommendations  Tub/shower seat    Recommendations for Other Services      Precautions / Restrictions Precautions Precautions: Fall       Mobility Bed Mobility Overal bed mobility: Needs Assistance Bed Mobility: Supine to Sit     Supine to sit: Supervision;HOB elevated          Transfers Overall transfer level:  Needs assistance Equipment used: Rolling walker (2 wheels) Transfers: Sit to/from Stand;Stand Pivot Transfers Sit to Stand: Supervision Stand pivot transfers: Supervision               Balance Overall balance assessment: Needs assistance Sitting-balance support: Feet supported;No upper extremity supported Sitting balance-Leahy Scale: Good     Standing balance support: Bilateral upper extremity supported Standing balance-Leahy Scale: Good                             ADL either performed or assessed with clinical judgement   ADL                                         General ADL Comments: SET UP for UB/LB bathing in sitting, CLOSE SUP for grooming (washing hands, brushing hair, oral care) in standing at sink; MAX A for LB dressing, SET UP for UB dressing; pain contiues to limit performance; CLOSE SUP For fxl ambulation from bed to bathroom.     Vision Baseline Vision/History: 1 Wears glasses     Perception     Praxis      Cognition Arousal/Alertness: Awake/alert Behavior During Therapy: WFL for tasks assessed/performed Overall Cognitive Status: Within Functional Limits for tasks assessed                                 General Comments: A&Ox4, pleasant and agreeable          Exercises Other Exercises Other Exercises: education re: compensatory techniques for ADL completion,AE,  log roll,   Shoulder Instructions       General Comments      Pertinent Vitals/ Pain       Pain Assessment: 0-10 Pain Score: 5  Pain Location: left groin Pain Descriptors / Indicators: Discomfort Pain Intervention(s): Limited activity within patient's tolerance  Home Living                                          Prior Functioning/Environment              Frequency  Min 1X/week        Progress Toward Goals  OT Goals(current goals can now be found in the care plan section)  Progress towards OT goals:  Progressing toward goals     Plan Discharge plan remains appropriate    Co-evaluation                 AM-PAC OT "6 Clicks" Daily Activity     Outcome Measure   Help from another person eating meals?: None Help from another person taking care of personal grooming?: None Help from another person toileting, which includes using toliet, bedpan, or urinal?: A Little Help from another person bathing (including washing, rinsing, drying)?: None Help from another person to put on and taking off regular upper body clothing?: None Help from another person to put on and taking off regular lower body clothing?: A Lot 6 Click Score: 21    End of Session Equipment Utilized During Treatment: Gait belt;Rolling walker (2 wheels)  OT Visit Diagnosis: Unsteadiness on feet (R26.81);Other abnormalities of gait and mobility (R26.89)   Activity Tolerance Patient tolerated treatment well;Patient limited by pain   Patient Left with call bell/phone within reach;with chair alarm set;in bed   Nurse Communication Mobility status        Time: 9528-4132 OT Time Calculation (min): 33 min  Charges: OT General Charges $OT Visit: 1 Visit OT Treatments $Self Care/Home Management : 23-37 mins  Shanon Payor, OTD OTR/L  09/26/21, 12:37 PM

## 2021-09-26 NOTE — Progress Notes (Signed)
Physical Therapy Treatment Patient Details Name: Margaret Frye MRN: [id number] DOB: 1946-01-17 Today's Date: 09/26/2021   History of Present Illness Patient is a 75 year old female presenting with progressive, intractable pain despite home opioids/muscle relaxants without trauma/fall. Found to have .foraminal disc herniation at the left L1-2 level, left L1 radiculopathy with recommendation for conservative management. 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    PT Comments    Pt received supine in bed agreeable to PT services. RN present to give pain meds prior to PT. Reports current pain 4/10 NPS in L groin/hip in L1 dermatome distribution. Able to transfer to EOB with HOB elevated and modified log roll technique due to [facility] being elevated. Able to stand with minguard to RW requiring increased time to complete due to pain. With slow and cautious gait, pt able to progress amb outside of room. Safe use of RW however significant WB'ing placed through UE's to off load LLE for pain relief. Pt returned to recliner and transported to PT gym for stair training. PT demo provided prior to attempt. Able to perform with L railing step to pattern and RUE supported by PT to mimic baseline support husband provides pt at home. Able to successfully perform with no LOB or safety concerns. Does display decreased eccentric control with descent with heavy reliance on UE's to assist in lowering LLE to following step. Pt returned to supine in bed with supervision. HH PT remains appropriate to progress ambulation with LRAD, improve LE strength, and pain relief.   Recommendations for follow up therapy are one component of a multi-disciplinary discharge planning process, led by the attending physician.   Recommendations may be updated based on patient status, additional functional criteria and insurance authorization.  Follow Up Recommendations  Home health PT     Assistance Recommended at Discharge Intermittent Supervision/Assistance  Equipment Recommendations  None recommended by PT    Recommendations for Other Services       Precautions / Restrictions Precautions Precautions: Fall     Mobility  Bed Mobility Overal bed mobility: Needs Assistance Bed Mobility: Supine to Sit;Sit to Supine     Supine to sit: Supervision;HOB elevated Sit to supine: Supervision     Patient Response: Cooperative  Transfers Overall transfer level: Needs assistance Equipment used: Rolling walker (2 wheels) Transfers: Sit to/from Stand Sit to Stand: Supervision Stand pivot transfers: Supervision;From elevated surface              Ambulation/Gait Ambulation/Gait assistance: Supervision Gait Distance (Feet): 50 Feet Assistive device: Rolling walker (2 wheels) Gait Pattern/deviations: Step-to pattern;Decreased step length - left;Decreased stance time - right;Decreased weight shift to left;Trunk flexed     General Gait Details: Relies on heavy UE support on RW due to L1 dermatome radicular pain in groin and posterior/lat hip   Stairs             Wheelchair Mobility    Modified Rankin (Stroke Patients Only)       Balance Overall balance assessment: Needs assistance Sitting-balance support: Feet supported;No upper extremity supported Sitting balance-Leahy Scale: Good     Standing balance support: Bilateral upper extremity supported;During functional activity Standing balance-Leahy Scale: Fair Standing balance comment: Heavy reliance on RW for support. More for pain relief compared to balance impairment  Cognition Arousal/Alertness: Awake/alert Behavior During Therapy: WFL for tasks assessed/performed Overall Cognitive Status:  Within Functional Limits for tasks assessed                                 General Comments: A&Ox4, pleasant and agreeable        Exercises Other Exercises Other Exercises: stair training, log roll    General Comments General comments (skin integrity, edema, etc.): Educated on stair training, reinforcing log roll technique      Pertinent Vitals/Pain Pain Assessment: 0-10 Pain Score: 4  Pain Location: left groin Pain Descriptors / Indicators: Discomfort;Numbness Pain Intervention(s): Limited activity within patient's tolerance;Monitored during session;Premedicated before session    Home Living                          Prior Function            PT Goals (current goals can now be found in the care plan section) Acute Rehab PT Goals Patient Stated Goal: to go home when pain controlled PT Goal Formulation: With patient Time For Goal Achievement: 10/08/21 Potential to Achieve Goals: Good Progress towards PT goals: Progressing toward goals    Frequency    Min 2X/week      PT Plan Current plan remains appropriate    Co-evaluation              AM-PAC PT "6 Clicks" Mobility   Outcome Measure  Help needed turning from your back to your side while in a flat bed without using bedrails?: None Help needed moving from lying on your back to sitting on the side of a flat bed without using bedrails?: A Little Help needed moving to and from a bed to a chair (including a wheelchair)?: A Little Help needed standing up from a chair using your arms (e.g., wheelchair or bedside chair)?: A Little Help needed to walk in hospital room?: A Little Help needed climbing 3-5 steps with a railing? : A Little 6 Click Score: 19    End of Session Equipment Utilized During Treatment: Gait belt Activity Tolerance: Patient limited by pain;Patient tolerated treatment well Patient left: in bed;with call bell/phone within reach;with bed alarm set Nurse  Communication: Mobility status PT Visit Diagnosis: Muscle weakness (generalized) (M62.81);Pain;Difficulty in walking, not elsewhere classified (R26.2) Pain - Right/Left: Left Pain - part of body: Hip     Time: 8676-7209 PT Time Calculation (min) (ACUTE ONLY): 34 min  Charges:  $Gait Training: 23-37 mins           Delphia Grates. Fairly IV, PT, DPT Physical Therapist-   [facility]  09/26/2021, 1:46 PM

## 2021-09-26 NOTE — TOC Progression Note (Addendum)
Transition of Care [facility]) - Progression Note    Patient Details  Name: Margaret Frye MRN: [id number] Date of Birth: 1946/06/14  Transition of [facility]) CM/SW [place], RN Phone Number: 09/26/2021, 1:43 PM  Clinical Narrative:   Met with the patient at the bedside to discuss DC plan and needs She lives at home with her husband, he provides transportation, she can afford her medication She stated that she has a rolling walker but it is old and does not work well, she would like to know what the copay would be for a new one, I reached out to [place] with [employer] and requested a quote, Will notify the patient once obtained, She is not able to get [facility] for PT, due to  but she is agreeable to go to Outpatient and stated that her husband can take her to the appointments, Referral to be  sent to [facility] Outpatient Pt at fax number [phone],   Update, Quote for the rolling walker is $14.21, the patient agreed and it will be delivered to her room before DC by [employer]        Expected Discharge Plan and Services                                                 Social Determinants of Health (SDOH) Interventions    Readmission Risk Interventions No flowsheet data found.

## 2021-09-26 NOTE — Care Management Important Message (Signed)
Important Message  Patient Details  Name: Margaret Frye MRN: [id number] Date of Birth: 06/28/1946   Medicare Important Message Given:  Yes     Olegario Messier A Shirleen Mcfaul 09/26/2021, 3:21 PM

## 2021-09-26 NOTE — Progress Notes (Signed)
PROGRESS NOTE    Margaret Frye  EGB:[id number] DOB: 05/03/1946 DOA: 09/23/2021 PCP: Marguarite Arbour, MD   Assessment & Plan:   Active Problems:   HTN (hypertension)   Hypothyroidism   Type II diabetes mellitus with complication (HCC)   Chronic, continuous use of opioids   Intractable low back pain   Lumbar herniated disc   History of lumbar laminectomy   Lumbar disc herniation: with left L1 nerve root compression. Hx of chronic pain & opioid dependent. Hx lumbar laminectomy L2-5 with fusion and ACDF. Imaging notable for left foraminal disc extrusion L1-2 impinging on the left L1 nerve root.  Evaluated by neurosurgery, Dr. Adriana Simas on 10/31 with recommendations of conservative management.  Consulted interventional radiology for neuroforaminal steroid injection, but unfortunately cannot accommodate. Continue on prednisone, fentanyl patch, gabapentin, oxycodone.   Constipation: likely secondary to narcotic use. Started on colace, lactulose    HTN: continue on metoprolol, HCTZ. Hydralazine prn   Hypothyroidism: continue on levothyroxine   DM2: HbA1c 6.8, pretty well controlled. Continue on SSI w/ accuchecks   Chronic pain syndrome: continue on home dose of gabapentin, norco, fentanyl patch, dilaudid    Insomnia: continue on trazodone    GERD: continue on PPI    Hx TIA: continue on aspirin, statin    DVT prophylaxis: lovenox  Code Status: full  Family Communication: d/c pt's care w/ pt's husband, [name], and answered his questions  Disposition Plan: likely d/c back home  Level of care: Med-Surg  Status is: Inpatient  Remains inpatient appropriate because: severity of pain     Consultants:  Neuro surg   Procedures:   Antimicrobials:    Subjective: Pt c/o severe back & leg pain   Objective: Vitals:   09/25/21 1938 09/26/21 0500 09/26/21 0752 09/26/21 1133  BP: (!) 155/68 (!) 152/75 132/64 (!) 128/49  Pulse: (!) 58 62 (!) 49 (!) 54  Resp: 16 16 14 14   Temp: 97.9  F (36.6 C) 97.9 F (36.6 C) (!) 97.4 F (36.3 C) 97.9 F (36.6 C)  TempSrc: Oral Oral Oral Oral  SpO2: 99% 97% 96% 97%  Weight:      Height:        Intake/Output Summary (Last 24 hours) at 09/26/2021 1522 Last data filed at 09/26/2021 1031 Gross per 24 hour  Intake 240 ml  Output 2000 ml  Net -1760 ml   Filed Weights   09/23/21 1440  Weight: 91.2 kg    Examination:  General exam: Appears calm but uncomfortable  Respiratory system: Clear to auscultation. Respiratory effort normal. Cardiovascular system: S1 & S2 +. No rubs, gallops or clicks Gastrointestinal system: Abdomen is nondistended, soft and nontender. Normal bowel sounds heard. Central nervous system: Alert and oriented. Moves all extremities Psychiatry: Judgement and insight appear normal. Flat mood and affect     Data Reviewed: I have personally reviewed following labs and imaging studies  CBC: Recent Labs  Lab 09/24/21 0614  WBC 7.3  NEUTROABS 5.5  HGB 13.2  HCT 39.4  MCV 90.6  PLT 275   Basic Metabolic Panel: Recent Labs  Lab 09/24/21 0614  NA 135  K 3.7  CL 97*  CO2 27  GLUCOSE 161*  BUN 18  CREATININE 0.80  CALCIUM 9.2   GFR: Estimated Creatinine Clearance: 68.5 mL/min (by C-G formula based on SCr of 0.8 mg/dL). Liver Function Tests: No results for input(s): AST, ALT, ALKPHOS, BILITOT, PROT, ALBUMIN in the last 168 hours. No results for input(s): LIPASE, AMYLASE in  the last 168 hours. No results for input(s): AMMONIA in the last 168 hours. Coagulation Profile: No results for input(s): INR, PROTIME in the last 168 hours. Cardiac Enzymes: No results for input(s): CKTOTAL, CKMB, CKMBINDEX, TROPONINI in the last 168 hours. BNP (last 3 results) No results for input(s): PROBNP in the last 8760 hours. HbA1C: Recent Labs    09/24/21 0614  HGBA1C 6.8*   CBG: Recent Labs  Lab 09/25/21 1208 09/25/21 1643 09/25/21 2112 09/26/21 0752 09/26/21 1134  GLUCAP 309* 310* 200* 160* 290*    Lipid Profile: No results for input(s): CHOL, HDL, LDLCALC, TRIG, CHOLHDL, LDLDIRECT in the last 72 hours. Thyroid Function Tests: No results for input(s): TSH, T4TOTAL, FREET4, T3FREE, THYROIDAB in the last 72 hours. Anemia Panel: No results for input(s): VITAMINB12, FOLATE, FERRITIN, TIBC, IRON, RETICCTPCT in the last 72 hours. Sepsis Labs: No results for input(s): PROCALCITON, LATICACIDVEN in the last 168 hours.  Recent Results (from the past 240 hour(s))  Resp Panel by RT-PCR (Flu A&B, Covid) Nasopharyngeal Swab     Status: None   Collection Time: 09/23/21  9:27 PM   Specimen: Nasopharyngeal Swab; Nasopharyngeal(NP) swabs in vial transport medium  Result Value Ref Range Status   SARS Coronavirus 2 by RT PCR NEGATIVE NEGATIVE Final    Comment: (NOTE) SARS-CoV-2 target nucleic acids are NOT DETECTED.  The SARS-CoV-2 RNA is generally detectable in upper respiratory specimens during the acute phase of infection. The lowest concentration of SARS-CoV-2 viral copies this assay can detect is 138 copies/mL. A negative result does not preclude SARS-Cov-2 infection and should not be used as the sole basis for treatment or other patient management decisions. A negative result may occur with  improper specimen collection/handling, submission of specimen other than nasopharyngeal swab, presence of viral mutation(s) within the areas targeted by this assay, and inadequate number of viral copies(<138 copies/mL). A negative result must be combined with clinical observations, patient history, and epidemiological information. The expected result is Negative.  Fact Sheet for Patients:  BloggerCourse.com  Fact Sheet for Healthcare Providers:  SeriousBroker.it  This test is no t yet approved or cleared by the [place] FDA and  has been authorized for detection and/or diagnosis of SARS-CoV-2 by FDA under an Emergency Use Authorization  (EUA). This EUA will remain  in effect (meaning this test can be used) for the duration of the COVID-19 declaration under Section 564(b)(1) of the Act, 21 U.S.C.section 360bbb-3(b)(1), unless the authorization is terminated  or revoked sooner.       Influenza A by PCR NEGATIVE NEGATIVE Final   Influenza B by PCR NEGATIVE NEGATIVE Final    Comment: (NOTE) The Xpert Xpress SARS-CoV-2/FLU/RSV plus assay is intended as an aid in the diagnosis of influenza from Nasopharyngeal swab specimens and should not be used as a sole basis for treatment. Nasal washings and aspirates are unacceptable for Xpert Xpress SARS-CoV-2/FLU/RSV testing.  Fact Sheet for Patients: BloggerCourse.com  Fact Sheet for Healthcare Providers: SeriousBroker.it  This test is not yet approved or cleared by the [place] FDA and has been authorized for detection and/or diagnosis of SARS-CoV-2 by FDA under an Emergency Use Authorization (EUA). This EUA will remain in effect (meaning this test can be used) for the duration of the COVID-19 declaration under Section 564(b)(1) of the Act, 21 U.S.C. section 360bbb-3(b)(1), unless the authorization is terminated or revoked.  Performed at [facility], [address] [place], [place] [address]          Radiology  Studies: No results found.      Scheduled Meds:  aspirin  81 mg Oral Daily   docusate sodium  200 mg Oral BID   enoxaparin (LOVENOX) injection  0.5 mg/kg Subcutaneous Q24H   fentaNYL  1 patch Transdermal Q72H   gabapentin  600 mg Oral QHS   gabapentin  600 mg Oral BID   hydrochlorothiazide  25 mg Oral Daily   insulin aspart  0-15 Units Subcutaneous TID WC   insulin aspart  0-5 Units Subcutaneous QHS   insulin aspart  8 Units Subcutaneous TID WC   lactulose  20 g Oral BID   levothyroxine  50 mcg Oral Q0600   metoprolol succinate  100 mg Oral BID   pantoprazole  80 mg Oral Daily    predniSONE  80 mg Oral Q breakfast   simvastatin  40 mg Oral QHS   traZODone  100 mg Oral QHS   Continuous Infusions:   LOS: 3 days    Time spent: 31 mins     Charise Killian, MD [facility] Pager [phone]  If 7PM-7AM, please contact night-coverage 09/26/2021, 3:22 PM

## 2021-09-26 NOTE — Progress Notes (Signed)
Patient reports the need to urinate, but state that it won't come out. She has been voiding without difficulty until now. Overnight she had great urine output. Bladder scan showed 800 mL. She states her bladder feels uncomfortable. MD was notified, waiting on further orders.

## 2021-09-26 NOTE — Progress Notes (Signed)
Inpatient Diabetes Program Recommendations  AACE/ADA: New Consensus Statement on Inpatient Glycemic Control (2015)  Target Ranges:  Prepandial:   less than 140 mg/dL      Peak postprandial:   less than 180 mg/dL (1-2 hours)      Critically ill patients:  140 - 180 mg/dL   Lab Results  Component Value Date   GLUCAP 290 (H) 09/26/2021   HGBA1C 6.8 (H) 09/24/2021    Review of Glycemic Control Results for Margaret Frye, Margaret Frye (MRN [id number]) as of 09/26/2021 12:39  Ref. Range 09/25/2021 07:54 09/25/2021 12:08 09/25/2021 16:43 09/25/2021 21:12 09/26/2021 07:52 09/26/2021 11:34  Glucose-Capillary Latest Ref Range: 70 - 99 mg/dL 643 (H) 329 (H) 518 (H) 200 (H) 160 (H) 290 (H)  Diabetes history: DM 2 Outpatient Diabetes medications:  Metformin 1000 mg bid Current orders for Inpatient glycemic control:  Novolog moderate tid with meals and HS Prednisone 80 mg q AM Novolog 4 units tid with meals (added 09/25/21) Inpatient Diabetes Program Recommendations:    Please consider increasing Novolog meal coverage to 8 units tid with meals (hold if patient eats less than 50% or NPO).   Thanks,  Beryl Meager, RN, BC-ADM Inpatient Diabetes Coordinator Pager [phone]  (8a-5p)

## 2021-09-27 LAB — BASIC METABOLIC PANEL
Anion gap: 9 (ref 5–15)
BUN: 31 mg/dL — ABNORMAL HIGH (ref 8–23)
CO2: 31 mmol/L (ref 22–32)
Calcium: 9.1 mg/dL (ref 8.9–10.3)
Chloride: 94 mmol/L — ABNORMAL LOW (ref 98–111)
Creatinine, Ser: 0.71 mg/dL (ref 0.44–1.00)
GFR, Estimated: >60 mL/min (ref >60)
Glucose, Bld: 143 mg/dL — ABNORMAL HIGH (ref 70–99)
Potassium: 3.5 mmol/L (ref 3.5–5.1)
Sodium: 134 mmol/L — ABNORMAL LOW (ref 135–145)

## 2021-09-27 LAB — CBC
HCT: 38.3 % (ref 36.0–46.0)
Hemoglobin: 13 g/dL (ref 12.0–15.0)
MCH: 30.7 pg (ref 26.0–34.0)
MCHC: 33.9 g/dL (ref 30.0–36.0)
MCV: 90.3 fL (ref 80.0–100.0)
Platelets: 265 10*3/uL (ref 150–400)
RBC: 4.24 MIL/uL (ref 3.87–5.11)
RDW: 13 % (ref 11.5–15.5)
WBC: 8.6 10*3/uL (ref 4.0–10.5)
nRBC: 0 % (ref 0.0–0.2)

## 2021-09-27 LAB — GLUCOSE, CAPILLARY
Glucose-Capillary: 156 mg/dL — ABNORMAL HIGH (ref 70–99)
Glucose-Capillary: 144 mg/dL — ABNORMAL HIGH (ref 70–99)
Glucose-Capillary: 191 mg/dL — ABNORMAL HIGH (ref 70–99)
Glucose-Capillary: 213 mg/dL — ABNORMAL HIGH (ref 70–99)

## 2021-09-27 MED ORDER — LACTULOSE 10 GM/15ML PO SOLN
20.0000 g | Freq: Two times a day (BID) | ORAL | Status: DC | PRN
  Administered 2021-09-30 (×2): 20 g via ORAL
  Filled 2021-09-27 (×4): qty 30

## 2021-09-27 NOTE — Progress Notes (Signed)
PROGRESS NOTE    Margaret Frye  ZYY:[id number] DOB: Dec 17, 1945 DOA: 09/23/2021 PCP: Marguarite Arbour, MD   Assessment & Plan:   Active Problems:   HTN (hypertension)   Hypothyroidism   Type II diabetes mellitus with complication (HCC)   Chronic, continuous use of opioids   Intractable low back pain   Lumbar herniated disc   History of lumbar laminectomy   Lumbar disc herniation: with left L1 nerve root compression. Still w/ severe pain today. Hx of chronic pain & opioid dependent. Hx lumbar laminectomy L2-5 with fusion and ACDF. Imaging notable for left foraminal disc extrusion L1-2 impinging on the left L1 nerve root.  Evaluated by neurosurgery, Dr. Adriana Simas on 10/31 with recommendations of conservative management.  Consulted interventional radiology for neuroforaminal steroid injection, but unfortunately cannot accommodate. Continue on fentanyl patch, oxycodone, gabapentin & prednisone   Constipation: likely secondary to narcotic use. Resolved w/ colace and lactulose    HTN: continue on HCTZ, metoprolol. Hydralazine prn   Hypothyroidism: continue on levothyroxine   DM2: pretty well controlled, HbA1c 6.8. Continue on SSI w/ accuchecks   Chronic pain syndrome: continue on home dose of gabapentin, norco, fentanyl patch, dilaudid    Insomnia: continue on trazodone    GERD: continue on PPI    Hx TIA: continue on statin, aspirin    DVT prophylaxis: lovenox  Code Status: full  Family Communication:  Disposition Plan: likely d/c back home  Level of care: Med-Surg  Status is: Inpatient  Remains inpatient appropriate because: severity of pain     Consultants:  Neuro surg   Procedures:   Antimicrobials:    Subjective: Pt c/o severe back pain still   Objective: Vitals:   09/26/21 1545 09/26/21 1948 09/26/21 2324 09/27/21 0457  BP: 131/65 (!) 142/74 125/64 (!) 127/56  Pulse: 60 61 (!) 48 (!) 49  Resp: 16 18 18 18   Temp: 97.6 F (36.4 C) 98.4 F (36.9 C) 98.1 F  (36.7 C) 97.7 F (36.5 C)  TempSrc: Oral Oral Oral Oral  SpO2: 93% 98% 97% 97%  Weight:      Height:        Intake/Output Summary (Last 24 hours) at 09/27/2021 0823 Last data filed at 09/26/2021 1659 Gross per 24 hour  Intake 240 ml  Output 1250 ml  Net -1010 ml   Filed Weights   09/23/21 1440  Weight: 91.2 kg    Examination:  General exam: Appears uncomfortable  Respiratory system: clear breath sounds b/l. Cardiovascular system: S1/S2+. No rubs or clicks  Gastrointestinal system: Abd is soft, NT, obese & normal bowel sounds  Central nervous system: alert and oriented. Moves all extremities  Psychiatry: Judgement and insight appears normal. Flat mood and affect     Data Reviewed: I have personally reviewed following labs and imaging studies  CBC: Recent Labs  Lab 09/24/21 0614 09/27/21 0744  WBC 7.3 8.6  NEUTROABS 5.5  --   HGB 13.2 13.0  HCT 39.4 38.3  MCV 90.6 90.3  PLT 275 265   Basic Metabolic Panel: Recent Labs  Lab 09/24/21 0614 09/27/21 0744  NA 135 134*  K 3.7 3.5  CL 97* 94*  CO2 27 31  GLUCOSE 161* 143*  BUN 18 31*  CREATININE 0.80 0.71  CALCIUM 9.2 9.1   GFR: Estimated Creatinine Clearance: 68.5 mL/min (by C-G formula based on SCr of 0.71 mg/dL). Liver Function Tests: No results for input(s): AST, ALT, ALKPHOS, BILITOT, PROT, ALBUMIN in the last 168 hours. No  results for input(s): LIPASE, AMYLASE in the last 168 hours. No results for input(s): AMMONIA in the last 168 hours. Coagulation Profile: No results for input(s): INR, PROTIME in the last 168 hours. Cardiac Enzymes: No results for input(s): CKTOTAL, CKMB, CKMBINDEX, TROPONINI in the last 168 hours. BNP (last 3 results) No results for input(s): PROBNP in the last 8760 hours. HbA1C: No results for input(s): HGBA1C in the last 72 hours.  CBG: Recent Labs  Lab 09/25/21 2112 09/26/21 0752 09/26/21 1134 09/26/21 1618 09/26/21 1950  GLUCAP 200* 160* 290* 293* 274*   Lipid  Profile: No results for input(s): CHOL, HDL, LDLCALC, TRIG, CHOLHDL, LDLDIRECT in the last 72 hours. Thyroid Function Tests: No results for input(s): TSH, T4TOTAL, FREET4, T3FREE, THYROIDAB in the last 72 hours. Anemia Panel: No results for input(s): VITAMINB12, FOLATE, FERRITIN, TIBC, IRON, RETICCTPCT in the last 72 hours. Sepsis Labs: No results for input(s): PROCALCITON, LATICACIDVEN in the last 168 hours.  Recent Results (from the past 240 hour(s))  Resp Panel by RT-PCR (Flu A&B, Covid) Nasopharyngeal Swab     Status: None   Collection Time: 09/23/21  9:27 PM   Specimen: Nasopharyngeal Swab; Nasopharyngeal(NP) swabs in vial transport medium  Result Value Ref Range Status   SARS Coronavirus 2 by RT PCR NEGATIVE NEGATIVE Final    Comment: (NOTE) SARS-CoV-2 target nucleic acids are NOT DETECTED.  The SARS-CoV-2 RNA is generally detectable in upper respiratory specimens during the acute phase of infection. The lowest concentration of SARS-CoV-2 viral copies this assay can detect is 138 copies/mL. A negative result does not preclude SARS-Cov-2 infection and should not be used as the sole basis for treatment or other patient management decisions. A negative result may occur with  improper specimen collection/handling, submission of specimen other than nasopharyngeal swab, presence of viral mutation(s) within the areas targeted by this assay, and inadequate number of viral copies(<138 copies/mL). A negative result must be combined with clinical observations, patient history, and epidemiological information. The expected result is Negative.  Fact Sheet for Patients:  BloggerCourse.com  Fact Sheet for Healthcare Providers:  SeriousBroker.it  This test is no t yet approved or cleared by the [place] FDA and  has been authorized for detection and/or diagnosis of SARS-CoV-2 by FDA under an Emergency Use Authorization (EUA). This EUA  will remain  in effect (meaning this test can be used) for the duration of the COVID-19 declaration under Section 564(b)(1) of the Act, 21 U.S.C.section 360bbb-3(b)(1), unless the authorization is terminated  or revoked sooner.       Influenza A by PCR NEGATIVE NEGATIVE Final   Influenza B by PCR NEGATIVE NEGATIVE Final    Comment: (NOTE) The Xpert Xpress SARS-CoV-2/FLU/RSV plus assay is intended as an aid in the diagnosis of influenza from Nasopharyngeal swab specimens and should not be used as a sole basis for treatment. Nasal washings and aspirates are unacceptable for Xpert Xpress SARS-CoV-2/FLU/RSV testing.  Fact Sheet for Patients: BloggerCourse.com  Fact Sheet for Healthcare Providers: SeriousBroker.it  This test is not yet approved or cleared by the [place] FDA and has been authorized for detection and/or diagnosis of SARS-CoV-2 by FDA under an Emergency Use Authorization (EUA). This EUA will remain in effect (meaning this test can be used) for the duration of the COVID-19 declaration under Section 564(b)(1) of the Act, 21 U.S.C. section 360bbb-3(b)(1), unless the authorization is terminated or revoked.  Performed at [facility], [address], [place], [place] [address]  Radiology Studies: No results found.      Scheduled Meds:  aspirin  81 mg Oral Daily   docusate sodium  200 mg Oral BID   enoxaparin (LOVENOX) injection  0.5 mg/kg Subcutaneous Q24H   fentaNYL  1 patch Transdermal Q72H   gabapentin  600 mg Oral QHS   gabapentin  600 mg Oral BID   hydrochlorothiazide  25 mg Oral Daily   insulin aspart  0-15 Units Subcutaneous TID WC   insulin aspart  0-5 Units Subcutaneous QHS   insulin aspart  8 Units Subcutaneous TID WC   lactulose  20 g Oral BID   levothyroxine  50 mcg Oral Q0600   metoprolol succinate  100 mg Oral BID   pantoprazole  80 mg Oral Daily   predniSONE  80 mg  Oral Q breakfast   simvastatin  40 mg Oral QHS   traZODone  100 mg Oral QHS   Continuous Infusions:   LOS: 4 days    Time spent: 30 mins     Charise Killian, MD [facility] Pager [phone]  If 7PM-7AM, please contact night-coverage 09/27/2021, 8:23 AM

## 2021-09-28 DIAGNOSIS — G8929 Other chronic pain: Secondary | ICD-10-CM

## 2021-09-28 LAB — BASIC METABOLIC PANEL
Anion gap: 10 (ref 5–15)
BUN: 29 mg/dL — ABNORMAL HIGH (ref 8–23)
CO2: 33 mmol/L — ABNORMAL HIGH (ref 22–32)
Calcium: 9.2 mg/dL (ref 8.9–10.3)
Chloride: 93 mmol/L — ABNORMAL LOW (ref 98–111)
Creatinine, Ser: 0.68 mg/dL (ref 0.44–1.00)
GFR, Estimated: >60 mL/min (ref >60)
Glucose, Bld: 130 mg/dL — ABNORMAL HIGH (ref 70–99)
Potassium: 3.3 mmol/L — ABNORMAL LOW (ref 3.5–5.1)
Sodium: 136 mmol/L (ref 135–145)

## 2021-09-28 LAB — CBC
HCT: 40.7 % (ref 36.0–46.0)
Hemoglobin: 13.8 g/dL (ref 12.0–15.0)
MCH: 31.2 pg (ref 26.0–34.0)
MCHC: 33.9 g/dL (ref 30.0–36.0)
MCV: 92.1 fL (ref 80.0–100.0)
Platelets: 280 10*3/uL (ref 150–400)
RBC: 4.42 MIL/uL (ref 3.87–5.11)
RDW: 13 % (ref 11.5–15.5)
WBC: 8.9 10*3/uL (ref 4.0–10.5)
nRBC: 0 % (ref 0.0–0.2)

## 2021-09-28 LAB — GLUCOSE, CAPILLARY
Glucose-Capillary: 158 mg/dL — ABNORMAL HIGH (ref 70–99)
Glucose-Capillary: 265 mg/dL — ABNORMAL HIGH (ref 70–99)
Glucose-Capillary: 272 mg/dL — ABNORMAL HIGH (ref 70–99)
Glucose-Capillary: 168 mg/dL — ABNORMAL HIGH (ref 70–99)

## 2021-09-28 MED ORDER — POTASSIUM CHLORIDE CRYS ER 20 MEQ PO TBCR
40.0000 meq | EXTENDED_RELEASE_TABLET | Freq: Once | ORAL | Status: AC
  Administered 2021-09-28: 40 meq via ORAL
  Filled 2021-09-28: qty 2

## 2021-09-28 NOTE — Progress Notes (Signed)
PROGRESS NOTE    Margaret Frye  EXB:[id number] DOB: [birth year] DOA: 09/23/2021 PCP: Marguarite Arbour, MD   Assessment & Plan:   Active Problems:   HTN (hypertension)   Hypothyroidism   Type II diabetes mellitus with complication (HCC)   Chronic, continuous use of opioids   Intractable low back pain   Lumbar herniated disc   History of lumbar laminectomy   Lumbar disc herniation: with left L1 nerve root compression. Still w/ severe pain today. Hx of chronic pain & opioid dependent. Hx lumbar laminectomy L2-5 with fusion and ACDF. Imaging notable for left foraminal disc extrusion L1-2 impinging on the left L1 nerve root.  Evaluated by neurosurgery, Dr. Adriana Simas on 10/31 with recommendations of conservative management.  Consulted interventional radiology for neuroforaminal steroid injection, but unfortunately cannot accommodate. Continue on fentanyl patch, oxycodone, gabapentin & prednisone. Recommend pt f/u w/ pain management physician as an outpatient   Constipation: likely secondary to narcotic use. Resolved w/ colace and lactulose    HTN: continue on metoprolol, HCTZ. Hydralazine prn   Hypothyroidism: continue on levothyroxine   DM2: HbA1c 6.8, pretty well controlled. Continue on SSI w/ accuchecks   Chronic pain syndrome: continue on home dose of gabapentin, norco, fentanyl patch, dilaudid    Insomnia: continue on trazodone    GERD: continue on PPI    Hx TIA: continue on statin, aspirin    DVT prophylaxis: lovenox  Code Status: full  Family Communication:  Disposition Plan: likely d/c back home  Level of care: Med-Surg  Status is: Inpatient  Remains inpatient appropriate because: severity of pain     Consultants:  Neuro surg   Procedures:   Antimicrobials:    Subjective: Pt c/o back pain slightly improved from day prior but worse w/ movement   Objective: Vitals:   09/27/21 2142 09/27/21 2252 09/28/21 0338 09/28/21 0520  BP: 121/68 124/76 (!) 160/94 140/68   Pulse: (!) 57 (!) 49 61 (!) 58  Resp:  16 17   Temp:  (!) 97.5 F (36.4 C) (!) 97.5 F (36.4 C)   TempSrc:  Oral Oral   SpO2:  96% 100%   Weight:      Height:        Intake/Output Summary (Last 24 hours) at 09/28/2021 0800 Last data filed at 09/28/2021 1027 Gross per 24 hour  Intake 440 ml  Output 0 ml  Net 440 ml   Filed Weights   09/23/21 1440  Weight: 91.2 kg    Examination:  General exam: appears calm but uncomfortable  Respiratory system: clear breath sounds b/l  Cardiovascular system: S1&S2+. No rubs or clicks  Gastrointestinal system: Abd is soft, NT, obese & normal bowel sounds  Central nervous system: alert and oriented. Moves all extremities  Psychiatry: Judgement and insight appears normal. Flat mood and affect     Data Reviewed: I have personally reviewed following labs and imaging studies  CBC: Recent Labs  Lab 09/24/21 0614 09/27/21 0744  WBC 7.3 8.6  NEUTROABS 5.5  --   HGB 13.2 13.0  HCT 39.4 38.3  MCV 90.6 90.3  PLT 275 265   Basic Metabolic Panel: Recent Labs  Lab 09/24/21 0614 09/27/21 0744  NA 135 134*  K 3.7 3.5  CL 97* 94*  CO2 27 31  GLUCOSE 161* 143*  BUN 18 31*  CREATININE 0.80 0.71  CALCIUM 9.2 9.1   GFR: Estimated Creatinine Clearance: 68.5 mL/min (by C-G formula based on SCr of 0.71 mg/dL). Liver Function Tests:  No results for input(s): AST, ALT, ALKPHOS, BILITOT, PROT, ALBUMIN in the last 168 hours. No results for input(s): LIPASE, AMYLASE in the last 168 hours. No results for input(s): AMMONIA in the last 168 hours. Coagulation Profile: No results for input(s): INR, PROTIME in the last 168 hours. Cardiac Enzymes: No results for input(s): CKTOTAL, CKMB, CKMBINDEX, TROPONINI in the last 168 hours. BNP (last 3 results) No results for input(s): PROBNP in the last 8760 hours. HbA1C: No results for input(s): HGBA1C in the last 72 hours.  CBG: Recent Labs  Lab 09/26/21 1950 09/27/21 0822 09/27/21 1206  09/27/21 1706 09/27/21 2048  GLUCAP 274* 156* 144* 191* 213*   Lipid Profile: No results for input(s): CHOL, HDL, LDLCALC, TRIG, CHOLHDL, LDLDIRECT in the last 72 hours. Thyroid Function Tests: No results for input(s): TSH, T4TOTAL, FREET4, T3FREE, THYROIDAB in the last 72 hours. Anemia Panel: No results for input(s): VITAMINB12, FOLATE, FERRITIN, TIBC, IRON, RETICCTPCT in the last 72 hours. Sepsis Labs: No results for input(s): PROCALCITON, LATICACIDVEN in the last 168 hours.  Recent Results (from the past 240 hour(s))  Resp Panel by RT-PCR (Flu A&B, Covid) Nasopharyngeal Swab     Status: None   Collection Time: 09/23/21  9:27 PM   Specimen: Nasopharyngeal Swab; Nasopharyngeal(NP) swabs in vial transport medium  Result Value Ref Range Status   SARS Coronavirus 2 by RT PCR NEGATIVE NEGATIVE Final    Comment: (NOTE) SARS-CoV-2 target nucleic acids are NOT DETECTED.  The SARS-CoV-2 RNA is generally detectable in upper respiratory specimens during the acute phase of infection. The lowest concentration of SARS-CoV-2 viral copies this assay can detect is 138 copies/mL. A negative result does not preclude SARS-Cov-2 infection and should not be used as the sole basis for treatment or other patient management decisions. A negative result may occur with  improper specimen collection/handling, submission of specimen other than nasopharyngeal swab, presence of viral mutation(s) within the areas targeted by this assay, and inadequate number of viral copies(<138 copies/mL). A negative result must be combined with clinical observations, patient history, and epidemiological information. The expected result is Negative.  Fact Sheet for Patients:  BloggerCourse.com  Fact Sheet for Healthcare Providers:  SeriousBroker.it  This test is no t yet approved or cleared by the [place] FDA and  has been authorized for detection and/or diagnosis  of SARS-CoV-2 by FDA under an Emergency Use Authorization (EUA). This EUA will remain  in effect (meaning this test can be used) for the duration of the COVID-19 declaration under Section 564(b)(1) of the Act, 21 U.S.C.section 360bbb-3(b)(1), unless the authorization is terminated  or revoked sooner.       Influenza A by PCR NEGATIVE NEGATIVE Final   Influenza B by PCR NEGATIVE NEGATIVE Final    Comment: (NOTE) The Xpert Xpress SARS-CoV-2/FLU/RSV plus assay is intended as an aid in the diagnosis of influenza from Nasopharyngeal swab specimens and should not be used as a sole basis for treatment. Nasal washings and aspirates are unacceptable for Xpert Xpress SARS-CoV-2/FLU/RSV testing.  Fact Sheet for Patients: BloggerCourse.com  Fact Sheet for Healthcare Providers: SeriousBroker.it  This test is not yet approved or cleared by the [place] FDA and has been authorized for detection and/or diagnosis of SARS-CoV-2 by FDA under an Emergency Use Authorization (EUA). This EUA will remain in effect (meaning this test can be used) for the duration of the COVID-19 declaration under Section 564(b)(1) of the Act, 21 U.S.C. section 360bbb-3(b)(1), unless the authorization is terminated or revoked.  Performed  at [facility], [address], [place], [place] [address]          Radiology Studies: No results found.      Scheduled Meds:  aspirin  81 mg Oral Daily   docusate sodium  200 mg Oral BID   enoxaparin (LOVENOX) injection  0.5 mg/kg Subcutaneous Q24H   fentaNYL  1 patch Transdermal Q72H   gabapentin  600 mg Oral QHS   gabapentin  600 mg Oral BID   hydrochlorothiazide  25 mg Oral Daily   insulin aspart  0-15 Units Subcutaneous TID WC   insulin aspart  0-5 Units Subcutaneous QHS   insulin aspart  8 Units Subcutaneous TID WC   levothyroxine  50 mcg Oral Q0600   metoprolol succinate  100 mg Oral BID    pantoprazole  80 mg Oral Daily   predniSONE  80 mg Oral Q breakfast   simvastatin  40 mg Oral QHS   traZODone  100 mg Oral QHS   Continuous Infusions:   LOS: 5 days    Time spent: 25 mins     Charise Killian, MD [facility] Pager [phone]  If 7PM-7AM, please contact night-coverage 09/28/2021, 8:00 AM

## 2021-09-28 NOTE — Progress Notes (Signed)
Occupational Therapy Treatment Patient Details Name: Margaret Frye MRN: [id number] DOB: 28-Oct-1946 Today's Date: 09/28/2021   History of present illness Patient is a 75 year old female presenting with progressive, intractable pain despite home opioids/muscle relaxants without trauma/fall. Found to have .foraminal disc herniation at the left L1-2 level, left L1 radiculopathy with recommendation for conservative management. 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   OT comments  Chart reviewed, RN cleared pt for OT tx session. Pt greeted in bed, agreeable to session. Tx session targeted progressing completion of ADL to return to PLOF, use of AE. Pt performed LB dressing with MIN A with sock aid, reacher. Grooming tasks completed at sink with supervision. Good use of log roll during bed mobility. Discharge recommendation updated to no OT following acute care stay. OT will continue to follow while pt is admitted. Pt is left as received, NAD, all needs met. RN aware of pt status.    Recommendations for follow up therapy are one component of a multi-disciplinary discharge planning process, led by the attending physician.  Recommendations may be updated based on patient status, additional functional criteria and insurance authorization.    Follow Up Recommendations  No OT follow up    Assistance Recommended at Discharge Intermittent Supervision/Assistance  Equipment Recommendations  Tub/shower seat    Recommendations for Other Services      Precautions / Restrictions Precautions Precautions: Fall       Mobility Bed Mobility Overal bed mobility: Needs Assistance Bed Mobility: Supine to Sit;Sit to Supine     Supine to sit: Supervision (HOB lowered to simulate home) Sit to  supine: Modified independent (Device/Increase time)        Transfers Overall transfer level: Needs assistance Equipment used: Rolling walker (2 wheels) Transfers: Sit to/from [facility] Sit to Stand: Supervision Stand pivot transfers: Supervision;From elevated surface               Balance Overall balance assessment: Needs assistance Sitting-balance support: Feet supported;No upper extremity supported Sitting balance-Leahy Scale: Good     Standing balance support: Bilateral upper extremity supported;During functional activity Standing balance-Leahy Scale: Fair Standing balance comment: Heavy reliance on RW for support. More for pain relief compared to balance impairment                           ADL either performed or assessed with clinical judgement   ADL                                         General ADL Comments: SUP for grooming at sink level, MIN A for LB dressing with AE      Cognition Arousal/Alertness: Awake/alert Behavior During Therapy: WFL for tasks assessed/performed Overall Cognitive Status: Within Functional Limits for tasks assessed                                 General Comments: A&Ox4, pleasant and agreeable          Exercises General Exercises - Lower Extremity Long Arc Quad: AROM;Strengthening;Seated;Both;10 reps Hip Flexion/Marching: AROM;Standing;Both;15 reps Other Exercises Other Exercises: educated re: AE use   Shoulder Instructions  General Comments      Pertinent Vitals/ Pain       Pain Assessment: 0-10 Pain Score: 6  Pain Location: left groin Pain Descriptors / Indicators: Discomfort;Numbness Pain Intervention(s): Limited activity within patient's tolerance;Repositioned;Monitored during session   Frequency  Min 1X/week        Progress Toward Goals  OT Goals(current goals can now be found in the care plan section)  Progress towards OT goals:  Progressing toward goals  Acute Rehab OT Goals Patient Stated Goal: to decrease pain  Plan Discharge plan needs to be updated    Co-evaluation                 AM-PAC OT "6 Clicks" Daily Activity     Outcome Measure   Help from another person eating meals?: None Help from another person taking care of personal grooming?: None Help from another person toileting, which includes using toliet, bedpan, or urinal?: A Little Help from another person bathing (including washing, rinsing, drying)?: None Help from another person to put on and taking off regular upper body clothing?: None Help from another person to put on and taking off regular lower body clothing?: A Little 6 Click Score: 22    End of Session Equipment Utilized During Treatment: Gait belt;Rolling walker (2 wheels)  OT Visit Diagnosis: Unsteadiness on feet (R26.81);Other abnormalities of gait and mobility (R26.89)   Activity Tolerance Patient tolerated treatment well;Patient limited by pain   Patient Left with call bell/phone within reach;with chair alarm set;in bed   Nurse Communication Mobility status        Time: 7121-9758 OT Time Calculation (min): 23 min  Charges: OT General Charges $OT Visit: 1 Visit OT Treatments $Self Care/Home Management : 23-37 mins  Shanon Payor, OTD OTR/L  09/28/21, 12:46 PM

## 2021-09-28 NOTE — Progress Notes (Signed)
Physical Therapy Treatment Patient Details Name: Margaret Frye MRN: [id number] DOB: Aug 18, 1946 Today's Date: 09/28/2021   History of Present Illness Patient is a 75 year old female presenting with progressive, intractable pain despite home opioids/muscle relaxants without trauma/fall. Found to have .foraminal disc herniation at the left L1-2 level, left L1 radiculopathy with recommendation for conservative management. 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       PT Comments    Pt received supine in bed agreeable to PT. Records pain at 6/10 NPS. Pt educated on adjustment of RW and performed by PT. Pt verbalizing understanding. Able to sit Eob and stand with supervision an progress gait distance. Cuing and education provided on attempts to progress step lengths bilat to attempt step through pattern with limited carryover due to L groin pain. Safe use of RW throughout just remains slow and cautious due to her pain. Educated on hip flexor and knee extension exercises in standing and sitting due to affected myotome and dermatome. Noted difficulty with hip flexion and LAQ's on LLE > RLE and per subjective reports. Given HEP. Pt remains safe with functional mobility progressing well and is safe to d/c home with [facility] PT.    Recommendations for follow up therapy are one component of a multi-disciplinary discharge planning process, led by the attending physician.  Recommendations may be updated based on patient status, additional functional criteria and insurance authorization.  Follow Up Recommendations  Home health PT     Assistance Recommended at Discharge    Equipment Recommendations  None recommended by PT    Recommendations for Other Services       Precautions / Restrictions  Precautions Precautions: Fall     Mobility  Bed Mobility Overal bed mobility: Needs Assistance Bed Mobility: Supine to Sit;Sit to Supine     Supine to sit: Supervision;HOB elevated Sit to supine: Supervision     Patient Response: Cooperative  Transfers Overall transfer level: Needs assistance Equipment used: Rolling walker (2 wheels) Transfers: Sit to/from Stand Sit to Stand: Supervision                Ambulation/Gait Ambulation/Gait assistance: Supervision Gait Distance (Feet): 75 Feet Assistive device: Rolling walker (2 wheels) Gait Pattern/deviations: Step-to pattern;Decreased step length - left;Decreased stance time - right;Decreased weight shift to left;Trunk flexed     General Gait Details: Relies on heavy UE support on RW due to L1 dermatome radicular pain in groin and posterior/lat hip   Stairs             Wheelchair Mobility    Modified Rankin (Stroke Patients Only)       Balance Overall balance assessment: Needs assistance Sitting-balance support: Feet supported;No upper extremity supported Sitting balance-Leahy Scale: Good     Standing balance support: Bilateral upper extremity supported;During functional activity Standing balance-Leahy Scale: Fair Standing balance comment: Heavy reliance on RW for support. More for pain relief compared to balance impairment                            Cognition Arousal/Alertness: Awake/alert Behavior During Therapy: WFL for tasks assessed/performed Overall Cognitive Status: Within Functional Limits for tasks assessed  Exercises General Exercises - Lower Extremity Long Arc Quad: AROM;Strengthening;Seated;Both;10 reps Hip Flexion/Marching: AROM;Standing;Both;15 reps Other Exercises Other Exercises: Educated on hip add isometrics and hip abduction for additional exercises.    General Comments        Pertinent Vitals/Pain Pain  Assessment: 0-10 Pain Score: 6  Pain Location: left groin Pain Descriptors / Indicators: Discomfort;Numbness Pain Intervention(s): Limited activity within patient's tolerance;Monitored during session;Repositioned;Premedicated before session    Home Living                          Prior Function            PT Goals (current goals can now be found in the care plan section) Acute Rehab PT Goals Patient Stated Goal: to go home when pain controlled PT Goal Formulation: With patient Time For Goal Achievement: 10/08/21 Potential to Achieve Goals: Good Progress towards PT goals: Progressing toward goals    Frequency    Min 2X/week      PT Plan Current plan remains appropriate    Co-evaluation              AM-PAC PT "6 Clicks" Mobility   Outcome Measure  Help needed turning from your back to your side while in a flat bed without using bedrails?: None Help needed moving from lying on your back to sitting on the side of a flat bed without using bedrails?: A Little Help needed moving to and from a bed to a chair (including a wheelchair)?: A Little Help needed standing up from a chair using your arms (e.g., wheelchair or bedside chair)?: A Little Help needed to walk in hospital room?: A Little Help needed climbing 3-5 steps with a railing? : A Little 6 Click Score: 19    End of Session Equipment Utilized During Treatment: Gait belt Activity Tolerance: Patient limited by pain;Patient tolerated treatment well Patient left: in bed;with call bell/phone within reach;with bed alarm set Nurse Communication: Mobility status PT Visit Diagnosis: Muscle weakness (generalized) (M62.81);Pain;Difficulty in walking, not elsewhere classified (R26.2) Pain - Right/Left: Left Pain - part of body: Hip     Time: 0737-1062 PT Time Calculation (min) (ACUTE ONLY): 31 min  Charges:  $Gait Training: 8-22 mins $Therapeutic Exercise: 8-22 mins                    Tova Vater M. Fairly  IV, PT, DPT Physical Therapist- [facility]  09/28/2021, 12:29 PM

## 2021-09-28 NOTE — Plan of Care (Signed)
Patient ID: KALAH PFLUM, female   DOB: 12-23-1945, 75 y.o.   MRN: [id number]  Problem: Education: Goal: Knowledge of General Education information will improve Description: Including pain rating scale, medication(s)/side effects and non-pharmacologic comfort measures Outcome: Progressing   Problem: Health Behavior/Discharge Planning: Goal: Ability to manage health-related needs will improve Outcome: Progressing   Problem: Clinical Measurements: Goal: Ability to maintain clinical measurements within normal limits will improve Outcome: Progressing Goal: Will remain free from infection Outcome: Progressing Goal: Diagnostic test results will improve Outcome: Progressing Goal: Respiratory complications will improve Outcome: Progressing Goal: Cardiovascular complication will be avoided Outcome: Progressing   Problem: Activity: Goal: Risk for activity intolerance will decrease Outcome: Progressing   Problem: Nutrition: Goal: Adequate nutrition will be maintained Outcome: Progressing   Problem: Coping: Goal: Level of anxiety will decrease Outcome: Progressing   Problem: Elimination: Goal: Will not experience complications related to bowel motility Outcome: Progressing Goal: Will not experience complications related to urinary retention Outcome: Progressing   Problem: Pain Managment: Goal: General experience of comfort will improve Outcome: Progressing   Problem: Safety: Goal: Ability to remain free from injury will improve Outcome: Progressing   Problem: Skin Integrity: Goal: Risk for impaired skin integrity will decrease Outcome: Progressing   Lidia Collum, RN

## 2021-09-29 DIAGNOSIS — G894 Chronic pain syndrome: Secondary | ICD-10-CM

## 2021-09-29 LAB — CBC
HCT: 41 % (ref 36.0–46.0)
Hemoglobin: 13.5 g/dL (ref 12.0–15.0)
MCH: 30.8 pg (ref 26.0–34.0)
MCHC: 32.9 g/dL (ref 30.0–36.0)
MCV: 93.6 fL (ref 80.0–100.0)
Platelets: 312 10*3/uL (ref 150–400)
RBC: 4.38 MIL/uL (ref 3.87–5.11)
RDW: 12.7 % (ref 11.5–15.5)
WBC: 9.1 10*3/uL (ref 4.0–10.5)
nRBC: 0 % (ref 0.0–0.2)

## 2021-09-29 LAB — GLUCOSE, CAPILLARY
Glucose-Capillary: 114 mg/dL — ABNORMAL HIGH (ref 70–99)
Glucose-Capillary: 245 mg/dL — ABNORMAL HIGH (ref 70–99)
Glucose-Capillary: 308 mg/dL — ABNORMAL HIGH (ref 70–99)
Glucose-Capillary: 304 mg/dL — ABNORMAL HIGH (ref 70–99)

## 2021-09-29 LAB — BASIC METABOLIC PANEL
Anion gap: 12 (ref 5–15)
BUN: 26 mg/dL — ABNORMAL HIGH (ref 8–23)
CO2: 27 mmol/L (ref 22–32)
Calcium: 8.8 mg/dL — ABNORMAL LOW (ref 8.9–10.3)
Chloride: 96 mmol/L — ABNORMAL LOW (ref 98–111)
Creatinine, Ser: 0.59 mg/dL (ref 0.44–1.00)
GFR, Estimated: >60 mL/min (ref >60)
Glucose, Bld: 126 mg/dL — ABNORMAL HIGH (ref 70–99)
Potassium: 3.8 mmol/L (ref 3.5–5.1)
Sodium: 135 mmol/L (ref 135–145)

## 2021-09-29 NOTE — Progress Notes (Signed)
PROGRESS NOTE    Margaret Frye  XAJ:[id number] DOB: December 04, 1945 DOA: 09/23/2021 PCP: Marguarite Arbour, MD   Assessment & Plan:   Active Problems:   HTN (hypertension)   Hypothyroidism   Type II diabetes mellitus with complication (HCC)   Chronic, continuous use of opioids   Intractable low back pain   Lumbar herniated disc   History of lumbar laminectomy   Lumbar disc herniation: with left L1 nerve root compression. Slightly improved back pain today. Hx of chronic pain & opioid dependent. Hx lumbar laminectomy L2-5 with fusion and ACDF. Imaging notable for left foraminal disc extrusion L1-2 impinging on the left L1 nerve root.  Evaluated by neurosurgery, Dr. Adriana Simas on 10/31 with recommendations of conservative management. Continue on fentanyl patch, oxycodone, gabapentin & prednisone. Recommend pt f/u w/ pain management physician as an outpatient   Constipation: likely secondary to narcotic use. Resolved w/ colace and lactulose    HTN: continue on metoprolol, HCTZ. Hydralazine prn   Hypothyroidism: continue on levothyroxine   DM2: pretty well controlled, HbA1c 6.8. Continue on SSI w/ accuchecks  Chronic pain syndrome: continue on home dose of gabapentin, norco, fentanyl patch, dilaudid    Insomnia: continue on trazodone    GERD: continue on PPI    Hx TIA: continue on aspirin, statin    DVT prophylaxis: lovenox  Code Status: full  Family Communication:  Disposition Plan: likely d/c back home  Level of care: Med-Surg  Status is: Inpatient  Remains inpatient appropriate because: improved severity of pain, hopefully can d/c in 24-48 hours    Consultants:  Neuro surg   Procedures:   Antimicrobials:    Subjective: Pt c/o significant back w/ movement but improved w/ rest   Objective: Vitals:   09/28/21 2201 09/29/21 0530 09/29/21 0553 09/29/21 0559  BP: (!) 141/69 116/62    Pulse: 61 (!) 50 (!) 51   Resp: 18 16    Temp:  (!) 97.5 F (36.4 C)    TempSrc:   Oral    SpO2: 96% 99% 99%   Weight:    93.7 kg  Height:        Intake/Output Summary (Last 24 hours) at 09/29/2021 0701 Last data filed at 09/29/2021 0540 Gross per 24 hour  Intake 360 ml  Output --  Net 360 ml   Filed Weights   09/23/21 1440 09/29/21 0559  Weight: 91.2 kg 93.7 kg    Examination:  General exam: appears comfortable  Respiratory system: clear breath sounds b/l Cardiovascular system: S1 & S2+. No rubs or clicks  Gastrointestinal system: Abd is soft, NT, obese & hypoactive bowel sounds  Central nervous system: alert and oriented. Moves all extremities   Psychiatry: Judgement and insight appears normal. Flat mood and affect    Data Reviewed: I have personally reviewed following labs and imaging studies  CBC: Recent Labs  Lab 09/24/21 0614 09/27/21 0744 09/28/21 0741 09/29/21 0408  WBC 7.3 8.6 8.9 9.1  NEUTROABS 5.5  --   --   --   HGB 13.2 13.0 13.8 13.5  HCT 39.4 38.3 40.7 41.0  MCV 90.6 90.3 92.1 93.6  PLT 275 265 280 312   Basic Metabolic Panel: Recent Labs  Lab 09/24/21 0614 09/27/21 0744 09/28/21 0741 09/29/21 0408  NA 135 134* 136 135  K 3.7 3.5 3.3* 3.8  CL 97* 94* 93* 96*  CO2 27 31 33* 27  GLUCOSE 161* 143* 130* 126*  BUN 18 31* 29* 26*  CREATININE 0.80 0.71  0.68 0.59  CALCIUM 9.2 9.1 9.2 8.8*   GFR: Estimated Creatinine Clearance: 69.5 mL/min (by C-G formula based on SCr of 0.59 mg/dL). Liver Function Tests: No results for input(s): AST, ALT, ALKPHOS, BILITOT, PROT, ALBUMIN in the last 168 hours. No results for input(s): LIPASE, AMYLASE in the last 168 hours. No results for input(s): AMMONIA in the last 168 hours. Coagulation Profile: No results for input(s): INR, PROTIME in the last 168 hours. Cardiac Enzymes: No results for input(s): CKTOTAL, CKMB, CKMBINDEX, TROPONINI in the last 168 hours. BNP (last 3 results) No results for input(s): PROBNP in the last 8760 hours. HbA1C: No results for input(s): HGBA1C in the last 72  hours.  CBG: Recent Labs  Lab 09/27/21 2048 09/28/21 0813 09/28/21 1215 09/28/21 1552 09/28/21 2157  GLUCAP 213* 158* 265* 272* 168*   Lipid Profile: No results for input(s): CHOL, HDL, LDLCALC, TRIG, CHOLHDL, LDLDIRECT in the last 72 hours. Thyroid Function Tests: No results for input(s): TSH, T4TOTAL, FREET4, T3FREE, THYROIDAB in the last 72 hours. Anemia Panel: No results for input(s): VITAMINB12, FOLATE, FERRITIN, TIBC, IRON, RETICCTPCT in the last 72 hours. Sepsis Labs: No results for input(s): PROCALCITON, LATICACIDVEN in the last 168 hours.  Recent Results (from the past 240 hour(s))  Resp Panel by RT-PCR (Flu A&B, Covid) Nasopharyngeal Swab     Status: None   Collection Time: 09/23/21  9:27 PM   Specimen: Nasopharyngeal Swab; Nasopharyngeal(NP) swabs in vial transport medium  Result Value Ref Range Status   SARS Coronavirus 2 by RT PCR NEGATIVE NEGATIVE Final    Comment: (NOTE) SARS-CoV-2 target nucleic acids are NOT DETECTED.  The SARS-CoV-2 RNA is generally detectable in upper respiratory specimens during the acute phase of infection. The lowest concentration of SARS-CoV-2 viral copies this assay can detect is 138 copies/mL. A negative result does not preclude SARS-Cov-2 infection and should not be used as the sole basis for treatment or other patient management decisions. A negative result may occur with  improper specimen collection/handling, submission of specimen other than nasopharyngeal swab, presence of viral mutation(s) within the areas targeted by this assay, and inadequate number of viral copies(<138 copies/mL). A negative result must be combined with clinical observations, patient history, and epidemiological information. The expected result is Negative.  Fact Sheet for Patients:  BloggerCourse.com  Fact Sheet for Healthcare Providers:  SeriousBroker.it  This test is no t yet approved or cleared by  the [place] FDA and  has been authorized for detection and/or diagnosis of SARS-CoV-2 by FDA under an Emergency Use Authorization (EUA). This EUA will remain  in effect (meaning this test can be used) for the duration of the COVID-19 declaration under Section 564(b)(1) of the Act, 21 U.S.C.section 360bbb-3(b)(1), unless the authorization is terminated  or revoked sooner.       Influenza A by PCR NEGATIVE NEGATIVE Final   Influenza B by PCR NEGATIVE NEGATIVE Final    Comment: (NOTE) The Xpert Xpress SARS-CoV-2/FLU/RSV plus assay is intended as an aid in the diagnosis of influenza from Nasopharyngeal swab specimens and should not be used as a sole basis for treatment. Nasal washings and aspirates are unacceptable for Xpert Xpress SARS-CoV-2/FLU/RSV testing.  Fact Sheet for Patients: BloggerCourse.com  Fact Sheet for Healthcare Providers: SeriousBroker.it  This test is not yet approved or cleared by the [place] FDA and has been authorized for detection and/or diagnosis of SARS-CoV-2 by FDA under an Emergency Use Authorization (EUA). This EUA will remain in effect (meaning this test can  be used) for the duration of the COVID-19 declaration under Section 564(b)(1) of the Act, 21 U.S.C. section 360bbb-3(b)(1), unless the authorization is terminated or revoked.  Performed at [facility], [address], [place], [place] [address]          Radiology Studies: No results found.      Scheduled Meds:  aspirin  81 mg Oral Daily   docusate sodium  200 mg Oral BID   enoxaparin (LOVENOX) injection  0.5 mg/kg Subcutaneous Q24H   fentaNYL  1 patch Transdermal Q72H   gabapentin  600 mg Oral QHS   gabapentin  600 mg Oral BID   hydrochlorothiazide  25 mg Oral Daily   insulin aspart  0-15 Units Subcutaneous TID WC   insulin aspart  0-5 Units Subcutaneous QHS   insulin aspart  8 Units Subcutaneous TID WC    levothyroxine  50 mcg Oral Q0600   metoprolol succinate  100 mg Oral BID   pantoprazole  80 mg Oral Daily   predniSONE  80 mg Oral Q breakfast   simvastatin  40 mg Oral QHS   traZODone  100 mg Oral QHS   Continuous Infusions:   LOS: 6 days    Time spent: 15 mins     Charise Killian, MD [facility] Pager [phone]  If 7PM-7AM, please contact night-coverage 09/29/2021, 7:01 AM

## 2021-09-30 LAB — GLUCOSE, CAPILLARY
Glucose-Capillary: 120 mg/dL — ABNORMAL HIGH (ref 70–99)
Glucose-Capillary: 221 mg/dL — ABNORMAL HIGH (ref 70–99)
Glucose-Capillary: 330 mg/dL — ABNORMAL HIGH (ref 70–99)
Glucose-Capillary: 241 mg/dL — ABNORMAL HIGH (ref 70–99)

## 2021-09-30 LAB — CBC
HCT: 39.3 % (ref 36.0–46.0)
Hemoglobin: 13.5 g/dL (ref 12.0–15.0)
MCH: 31.2 pg (ref 26.0–34.0)
MCHC: 34.4 g/dL (ref 30.0–36.0)
MCV: 90.8 fL (ref 80.0–100.0)
Platelets: 318 10*3/uL (ref 150–400)
RBC: 4.33 MIL/uL (ref 3.87–5.11)
RDW: 12.6 % (ref 11.5–15.5)
WBC: 9.3 10*3/uL (ref 4.0–10.5)
nRBC: 0 % (ref 0.0–0.2)

## 2021-09-30 LAB — BASIC METABOLIC PANEL
Anion gap: 9 (ref 5–15)
BUN: 26 mg/dL — ABNORMAL HIGH (ref 8–23)
CO2: 30 mmol/L (ref 22–32)
Calcium: 9 mg/dL (ref 8.9–10.3)
Chloride: 96 mmol/L — ABNORMAL LOW (ref 98–111)
Creatinine, Ser: 0.72 mg/dL (ref 0.44–1.00)
GFR, Estimated: >60 mL/min (ref >60)
Glucose, Bld: 123 mg/dL — ABNORMAL HIGH (ref 70–99)
Potassium: 3.6 mmol/L (ref 3.5–5.1)
Sodium: 135 mmol/L (ref 135–145)

## 2021-09-30 NOTE — Progress Notes (Signed)
PROGRESS NOTE    Margaret Frye  TDH:[id number] DOB: 1946-02-18 DOA: 09/23/2021 PCP: Marguarite Arbour, MD   Assessment & Plan:   Active Problems:   HTN (hypertension)   Hypothyroidism   Type II diabetes mellitus with complication (HCC)   Chronic, continuous use of opioids   Intractable low back pain   Lumbar herniated disc   History of lumbar laminectomy   Lumbar disc herniation: with left L1 nerve root compression. Slowly improving back pain. Hx of chronic pain & opioid dependent. Hx lumbar laminectomy L2-5 with fusion and ACDF. Imaging notable for left foraminal disc extrusion L1-2 impinging on the left L1 nerve root.  Evaluated by neurosurgery, Dr. Adriana Simas on 10/31 with recommendations of conservative management. Continue on fentanyl patch, oxycodone, gabapentin & prednisone. Recommend pt f/u w/ pain management physician as an outpatient   Constipation: likely secondary to narcotic use. Continue on colace. Lactulose prn    HTN: continue on HCTZ, metoprolol   Hypothyroidism: continue on synthroid   DM2: HbA1c 6.8, pretty well controlled. Continue on SSI w/ accuchecks   Chronic pain syndrome: continue on home dose of gabapentin, norco, fentanyl patch, dilaudid    Insomnia: continue on trazodone    GERD: continue on PPI    Hx TIA: continue on statin, aspirin    DVT prophylaxis: lovenox  Code Status: full  Family Communication: discussed pt's care w/ pt's husband, [name], and answered his questions Disposition Plan: likely d/c back home  Level of care: Med-Surg  Status is: Inpatient  Remains inpatient appropriate because: improved severity of pain, hopefully can d/c in 24 hrs    Consultants:  Neuro surg   Procedures:   Antimicrobials:    Subjective: Pt c/o back pain slightly improved from last night  Objective: Vitals:   09/29/21 1207 09/29/21 1636 09/29/21 1945 09/30/21 0419  BP: (!) 126/57 139/62 (!) 154/74 131/65  Pulse: (!) 52 62 63 (!) 50  Resp: 19 18  18 20   Temp: 98 F (36.7 C) 98.3 F (36.8 C) 98.9 F (37.2 C) 98 F (36.7 C)  TempSrc:      SpO2: 94% 95% 96% 96%  Weight:      Height:        Intake/Output Summary (Last 24 hours) at 09/30/2021 0810 Last data filed at 09/30/2021 0751 Gross per 24 hour  Intake 360 ml  Output --  Net 360 ml   Filed Weights   09/23/21 1440 09/29/21 0559  Weight: 91.2 kg 93.7 kg    Examination:  General exam: appears calm & comfortable   Respiratory system: clear breath sounds b/l Cardiovascular system: S1/S2+. No rubs or clicks  Gastrointestinal system: Abd is soft, NT, obese & hypoactive bowel sounds Central nervous system: Alert and oriented. Moves all extremities  Psychiatry: Judgement and insight appears normal. Flat mood and affect    Data Reviewed: I have personally reviewed following labs and imaging studies  CBC: Recent Labs  Lab 09/24/21 0614 09/27/21 0744 09/28/21 0741 09/29/21 0408 09/30/21 0410  WBC 7.3 8.6 8.9 9.1 9.3  NEUTROABS 5.5  --   --   --   --   HGB 13.2 13.0 13.8 13.5 13.5  HCT 39.4 38.3 40.7 41.0 39.3  MCV 90.6 90.3 92.1 93.6 90.8  PLT 275 265 280 312 318   Basic Metabolic Panel: Recent Labs  Lab 09/24/21 0614 09/27/21 0744 09/28/21 0741 09/29/21 0408 09/30/21 0410  NA 135 134* 136 135 135  K 3.7 3.5 3.3* 3.8 3.6  CL 97* 94* 93* 96* 96*  CO2 27 31 33* 27 30  GLUCOSE 161* 143* 130* 126* 123*  BUN 18 31* 29* 26* 26*  CREATININE 0.80 0.71 0.68 0.59 0.72  CALCIUM 9.2 9.1 9.2 8.8* 9.0   GFR: Estimated Creatinine Clearance: 69.5 mL/min (by C-G formula based on SCr of 0.72 mg/dL). Liver Function Tests: No results for input(s): AST, ALT, ALKPHOS, BILITOT, PROT, ALBUMIN in the last 168 hours. No results for input(s): LIPASE, AMYLASE in the last 168 hours. No results for input(s): AMMONIA in the last 168 hours. Coagulation Profile: No results for input(s): INR, PROTIME in the last 168 hours. Cardiac Enzymes: No results for input(s): CKTOTAL, CKMB,  CKMBINDEX, TROPONINI in the last 168 hours. BNP (last 3 results) No results for input(s): PROBNP in the last 8760 hours. HbA1C: No results for input(s): HGBA1C in the last 72 hours.  CBG: Recent Labs  Lab 09/29/21 0807 09/29/21 1208 09/29/21 1640 09/29/21 2019 09/30/21 0724  GLUCAP 114* 245* 308* 304* 120*   Lipid Profile: No results for input(s): CHOL, HDL, LDLCALC, TRIG, CHOLHDL, LDLDIRECT in the last 72 hours. Thyroid Function Tests: No results for input(s): TSH, T4TOTAL, FREET4, T3FREE, THYROIDAB in the last 72 hours. Anemia Panel: No results for input(s): VITAMINB12, FOLATE, FERRITIN, TIBC, IRON, RETICCTPCT in the last 72 hours. Sepsis Labs: No results for input(s): PROCALCITON, LATICACIDVEN in the last 168 hours.  Recent Results (from the past 240 hour(s))  Resp Panel by RT-PCR (Flu A&B, Covid) Nasopharyngeal Swab     Status: None   Collection Time: 09/23/21  9:27 PM   Specimen: Nasopharyngeal Swab; Nasopharyngeal(NP) swabs in vial transport medium  Result Value Ref Range Status   SARS Coronavirus 2 by RT PCR NEGATIVE NEGATIVE Final    Comment: (NOTE) SARS-CoV-2 target nucleic acids are NOT DETECTED.  The SARS-CoV-2 RNA is generally detectable in upper respiratory specimens during the acute phase of infection. The lowest concentration of SARS-CoV-2 viral copies this assay can detect is 138 copies/mL. A negative result does not preclude SARS-Cov-2 infection and should not be used as the sole basis for treatment or other patient management decisions. A negative result may occur with  improper specimen collection/handling, submission of specimen other than nasopharyngeal swab, presence of viral mutation(s) within the areas targeted by this assay, and inadequate number of viral copies(<138 copies/mL). A negative result must be combined with clinical observations, patient history, and epidemiological information. The expected result is Negative.  Fact Sheet for  Patients:  BloggerCourse.com  Fact Sheet for Healthcare Providers:  SeriousBroker.it  This test is no t yet approved or cleared by the [place] FDA and  has been authorized for detection and/or diagnosis of SARS-CoV-2 by FDA under an Emergency Use Authorization (EUA). This EUA will remain  in effect (meaning this test can be used) for the duration of the COVID-19 declaration under Section 564(b)(1) of the Act, 21 U.S.C.section 360bbb-3(b)(1), unless the authorization is terminated  or revoked sooner.       Influenza A by PCR NEGATIVE NEGATIVE Final   Influenza B by PCR NEGATIVE NEGATIVE Final    Comment: (NOTE) The Xpert Xpress SARS-CoV-2/FLU/RSV plus assay is intended as an aid in the diagnosis of influenza from Nasopharyngeal swab specimens and should not be used as a sole basis for treatment. Nasal washings and aspirates are unacceptable for Xpert Xpress SARS-CoV-2/FLU/RSV testing.  Fact Sheet for Patients: BloggerCourse.com  Fact Sheet for Healthcare Providers: SeriousBroker.it  This test is not yet approved or cleared  by the [place] and has been authorized for detection and/or diagnosis of SARS-CoV-2 by FDA under an Emergency Use Authorization (EUA). This EUA will remain in effect (meaning this test can be used) for the duration of the COVID-19 declaration under Section 564(b)(1) of the Act, 21 U.S.C. section 360bbb-3(b)(1), unless the authorization is terminated or revoked.  Performed at [facility], [address], [place], [place] [address]          Radiology Studies: No results found.      Scheduled Meds:  aspirin  81 mg Oral Daily   docusate sodium  200 mg Oral BID   enoxaparin (LOVENOX) injection  0.5 mg/kg Subcutaneous Q24H   fentaNYL  1 patch Transdermal Q72H   gabapentin  600 mg Oral QHS   gabapentin  600 mg Oral BID    hydrochlorothiazide  25 mg Oral Daily   insulin aspart  0-15 Units Subcutaneous TID WC   insulin aspart  0-5 Units Subcutaneous QHS   insulin aspart  8 Units Subcutaneous TID WC   levothyroxine  50 mcg Oral Q0600   metoprolol succinate  100 mg Oral BID   pantoprazole  80 mg Oral Daily   predniSONE  80 mg Oral Q breakfast   simvastatin  40 mg Oral QHS   traZODone  100 mg Oral QHS   Continuous Infusions:   LOS: 7 days    Time spent: 15 mins     Charise Killian, MD [facility] Pager [phone]  If 7PM-7AM, please contact night-coverage 09/30/2021, 8:10 AM

## 2021-10-01 LAB — CBC
HCT: 39.6 % (ref 36.0–46.0)
Hemoglobin: 13.1 g/dL (ref 12.0–15.0)
MCH: 30.3 pg (ref 26.0–34.0)
MCHC: 33.1 g/dL (ref 30.0–36.0)
MCV: 91.7 fL (ref 80.0–100.0)
Platelets: 315 10*3/uL (ref 150–400)
RBC: 4.32 MIL/uL (ref 3.87–5.11)
RDW: 12.8 % (ref 11.5–15.5)
WBC: 12.2 10*3/uL — ABNORMAL HIGH (ref 4.0–10.5)
nRBC: 0 % (ref 0.0–0.2)

## 2021-10-01 LAB — BASIC METABOLIC PANEL WITH GFR
Anion gap: 15 (ref 5–15)
BUN: 28 mg/dL — ABNORMAL HIGH (ref 8–23)
CO2: 30 mmol/L (ref 22–32)
Calcium: 9.3 mg/dL (ref 8.9–10.3)
Chloride: 95 mmol/L — ABNORMAL LOW (ref 98–111)
Creatinine, Ser: 0.78 mg/dL (ref 0.44–1.00)
GFR, Estimated: >60 mL/min
Glucose, Bld: 108 mg/dL — ABNORMAL HIGH (ref 70–99)
Potassium: 3.6 mmol/L (ref 3.5–5.1)
Sodium: 140 mmol/L (ref 135–145)

## 2021-10-01 LAB — GLUCOSE, CAPILLARY: Glucose-Capillary: 222 mg/dL — ABNORMAL HIGH (ref 70–99)

## 2021-10-01 MED ORDER — FENTANYL 50 MCG/HR TD PT72: 1.0000 | MEDICATED_PATCH | TRANSDERMAL | 0 refills | Status: AC

## 2021-10-01 MED ORDER — HYDROCODONE-ACETAMINOPHEN 10-325 MG PO TABS: 1.0000 | ORAL_TABLET | ORAL | 0 refills | Status: AC | PRN

## 2021-10-01 NOTE — Progress Notes (Signed)
Physical Therapy Treatment Patient Details Name: Margaret Frye MRN: [id number] DOB: Dec 05, 1945 Today's Date: 10/01/2021   History of Present Illness Patient is a 75 year old female presenting with progressive, intractable pain despite home opioids/muscle relaxants without trauma/fall. Found to have .foraminal disc herniation at the left L1-2 level, left L1 radiculopathy with recommendation for conservative management. 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    PT Comments    Pt received seated in chair agreeable to PT. Performing LE therex prior to ambulation. Pain with L hip flexion and adduction increased today due to GI discomfort in LLQ. Kept exercise in pt tolerance. Remains supervision to standing and walking with RW. Able to progress to 26' still requiring frequent cuing for improving LLE step length to improve step through pattern. 2-3 standing rest breaks required due to increased pain in L groin to 4/10 NPS. Returned to room and supine in bed with supervision reporting further increase in Low back and L groin pain. NT aware to notify RN. Pt remains safe in mobility to return to home environment.    Recommendations for follow up therapy are one component of a multi-disciplinary discharge planning process, led by the attending physician.  Recommendations may be updated based on patient status, additional functional criteria and insurance authorization.  Follow Up Recommendations  Home health PT     Assistance Recommended at Discharge Intermittent Supervision/Assistance  Equipment Recommendations  None recommended by PT    Recommendations for Other Services       Precautions / Restrictions Precautions Precautions: Fall Restrictions Weight Bearing Restrictions: No      Mobility  Bed Mobility Overal bed mobility: Needs Assistance Bed Mobility: Supine to Sit;Sit to Supine     Supine to sit: Supervision Sit to supine: Supervision     Patient Response: Cooperative  Transfers Overall transfer level: Needs assistance Equipment used: Rolling walker (2 wheels) Transfers: Sit to/from Stand Sit to Stand: Supervision                Ambulation/Gait Ambulation/Gait assistance: Supervision Gait Distance (Feet): 85 Feet Assistive device: Rolling walker (2 wheels) Gait Pattern/deviations: Step-to pattern;Decreased step length - left;Decreased stance time - right;Decreased weight shift to left;Trunk flexed       General Gait Details: Relies on heavy UE support on RW due to L1 dermatome radicular pain in groin and posterior/lat hip   Stairs             Wheelchair Mobility    Modified Rankin (Stroke Patients Only)       Balance Overall balance assessment: Needs assistance Sitting-balance support: Feet supported;No upper extremity supported Sitting balance-Leahy Scale: Good     Standing balance support: Bilateral upper extremity supported;During functional activity Standing balance-Leahy Scale: Fair Standing balance comment: Heavy reliance on RW for support. More for pain relief compared to balance impairment                            Cognition Arousal/Alertness: Awake/alert Behavior During Therapy: WFL for tasks assessed/performed Overall Cognitive Status: Within Functional Limits for tasks assessed  Exercises General Exercises - Lower Extremity Long Arc Quad: AROM;Strengthening;Seated;Both;10 reps Hip ABduction/ADduction: AROM;Left;10 reps;Seated;Strengthening Hip Flexion/Marching: AROM;Standing;Both;10 reps;Seated    General Comments        Pertinent Vitals/Pain Pain Assessment: 0-10 Pain Score: 3  Pain Location: left groin; GI  "discomfort" Pain Intervention(s): Limited activity within patient's tolerance;Monitored during session;Premedicated before session;Repositioned;Patient requesting pain meds-RN notified    Home Living                          Prior Function            PT Goals (current goals can now be found in the care plan section) Acute Rehab PT Goals Patient Stated Goal: to go home when pain controlled PT Goal Formulation: With patient Time For Goal Achievement: 10/08/21 Potential to Achieve Goals: Good Progress towards PT goals: Progressing toward goals    Frequency    Min 2X/week      PT Plan Current plan remains appropriate    Co-evaluation              AM-PAC PT "6 Clicks" Mobility   Outcome Measure  Help needed turning from your back to your side while in a flat bed without using bedrails?: None Help needed moving from lying on your back to sitting on the side of a flat bed without using bedrails?: A Little Help needed moving to and from a bed to a chair (including a wheelchair)?: A Little Help needed standing up from a chair using your arms (e.g., wheelchair or bedside chair)?: A Little Help needed to walk in hospital room?: A Little Help needed climbing 3-5 steps with a railing? : A Little 6 Click Score: 19    End of Session Equipment Utilized During Treatment: Gait belt Activity Tolerance: Patient limited by pain;Patient tolerated treatment well Patient left: in bed;with call bell/phone within reach;with bed alarm set Nurse Communication: Mobility status PT Visit Diagnosis: Muscle weakness (generalized) (M62.81);Pain;Difficulty in walking, not elsewhere classified (R26.2) Pain - Right/Left: Left Pain - part of body: Hip     Time: 4742-5956 PT Time Calculation (min) (ACUTE ONLY): 32 min  Charges:  $Gait Training: 8-22 mins $Therapeutic Exercise: 8-22 mins                     Reis Goga M. Fairly IV, PT, DPT Physical Therapist- [place]  [facility]  10/01/2021, 1:10 PM

## 2021-10-01 NOTE — Discharge Summary (Signed)
Physician Discharge Summary  [name] VPC:[id number] DOB: 1946-10-14 DOA: 09/23/2021  PCP: Marguarite Arbour, MD  Admit date: 09/23/2021 Discharge date: 10/01/2021  Admitted From: home  Disposition:  home   Recommendations for Outpatient Follow-up:  Follow up with PCP in 1-2 weeks F/u w/ pain management, Dr. Ardelle Anton, w/in 1 week   Home Health: no  Equipment/Devices:  Discharge Condition: stable  CODE STATUS: full  Diet recommendation: Heart Healthy/carb modified   Brief/Interim Summary: HPI was taken from Dr. Para March: Margaret Expose Frye is a 75 y.o. female with medical history significant for DM, HTN, TIA, asthma, migraine, lumbar laminectomy , ACDF, DJD of the knees, on chronic opiates for over 20 years who presents to the ED with sudden onset left-sided lower back and hip pain a few days prior but has been steadily worsening since.  The pain is radiating down her leg and is now of severe intensity.  She has been taking her pain medication as well as muscle relaxants without relief.  She denies bowel or bladder dysfunction or numbness or tingling in the groin area or descriptions of saddle anesthesia.  She denies weakness in the lower extremities.  She denies fever or chills   ED course: On arrival, BP 176/93 with otherwise unremarkable vitals Labs pending   Imaging: CT lumbar spine significant for new moderate to large left foraminal disc protrusion at L1-L2 likely affecting the left L1 nerve root CT pelvis, x-ray left hip and x-ray lumbar spine without acute findings   Patient received 3 rounds of Dilaudid as well as fentanyl in the ED without adequate pain relief.  MRI was attempted but patient was unable to tolerate lying flat for prolonged period and had to be canceled.  Decadron and ketamine ordered.  Hospitalist consulted for admission.    As per Dr. [place]:  Margaret Frye is a 75 year old female with past medical history significant for type 2 diabetes mellitus, essential  hypertension, TIA, asthma, migraine headache, hypothyroidism, hyperlipidemia, B12 deficiency, hx lumbar spondylolisthesis s/p laminectomy with fusion L2-L5 2018, Hx ACDF, chronic pain syndrome who presents to [facility] ED on 09/23/2021 with complaint of sudden onset left-sided lower back, lower extremity, groin pain.  Onset Tuesday, progressively worsening since.  Has not been controlled with her home pain medication/muscle relaxants.  Denies bowel/bladder dysfunction, no paresthesias to the groin area.  Denies weakness.  No fever/chills.  Denies any trauma/falls.   In the ED, temperature 98.2 F, HR 91, RR 16, BP 176/93, SPO2 99% on room air.  Sodium 135, potassium 3.7, chloride 97, CO2 27, BUN 18, creatinine 0.80, glucose 161.  WBC 7.3, hemoglobin 13.2, platelets 275.  Urinalysis unrevealing.  Covid-19 PCR negative.  Influenza A/B PCR negative.  Left hip x-ray negative for acute traumatic injury.  L-spine x-ray with no acute displaced fracture or traumatic listhesis of the lumbar spine, noted s/p L2-L5 posterior injured interbody fusion.  CT pelvis without contrast with mild degenerative changes hips but no fracture or AVN, no acute bony normalities, surrounding hip/pelvic musculature grossly normal with no obvious muscle tear or intramuscular hematoma.  CT L-spine with large left foraminal disc protrusion L1-2 affecting left L1 nerve root.  MR L-spine without contrast with left foraminal disc extrusion L1-2 impinging on the exiting L1 left nerve root.  Patient received Dilaudid, fentanyl and ketamine by EDP.  Started on Decadron.  Hospitalist service consulted for further evaluation and management of acute left lower extremity pain secondary to lumbar disc herniation with impingement of  left L1 nerve root.   As per Dr. Mayford Knife 11/2-11/7/22: Pt continued to have back pain which improved in severity. Pt is aware that is is a chronic problem and that she will still have pain at d/c. Pt verbalized her understanding.  It was recommended that the pt see a pain management physician as an outpatient.   Discharge Diagnoses:  Active Problems:   HTN (hypertension)   Hypothyroidism   Type II diabetes mellitus with complication (HCC)   Chronic, continuous use of opioids   Intractable low back pain   Lumbar herniated disc   History of lumbar laminectomy  Lumbar disc herniation: with left L1 nerve root compression. Slowly improving back pain. Hx of chronic pain & opioid dependent. Hx lumbar laminectomy L2-5 with fusion and ACDF. Imaging notable for left foraminal disc extrusion L1-2 impinging on the left L1 nerve root.  Evaluated by neurosurgery, Dr. Adriana Simas on 10/31 with recommendations of conservative management. Continue on fentanyl patch, oxycodone, gabapentin. Completed steroid course. Recommend pt f/u w/ pain management physician as an outpatient   Constipation: likely secondary to narcotic use. Continue on colace. Lactulose prn    HTN: continue on HCTZ, metoprolol   Hypothyroidism: continue on synthroid   DM2: HbA1c 6.8, pretty well controlled. Continue on SSI w/ accuchecks   Chronic pain syndrome: continue on home dose of gabapentin, norco, fentanyl patch, dilaudid    Insomnia: continue on trazodone    GERD: continue on PPI    Hx TIA: continue on statin, aspirin    Discharge Instructions  Discharge Instructions     Diet - low sodium heart healthy   Complete by: As directed    Discharge instructions   Complete by: As directed    F/u w/ PCP in 1-2 weeks. F/u w/ pain management, Dr. Edward Jolly, within 1 week   Increase activity slowly   Complete by: As directed       Allergies as of 10/01/2021   No Known Allergies      Medication List     TAKE these medications    albuterol 108 (90 Base) MCG/ACT inhaler Commonly known as: VENTOLIN HFA Inhale 2 puffs into the lungs every 6 (six) hours as needed for wheezing.   aspirin 81 MG chewable tablet Chew 81 mg by mouth daily.    celecoxib 200 MG capsule Commonly known as: CELEBREX Take 200 mg by mouth 2 (two) times daily.   cyclobenzaprine 10 MG tablet Commonly known as: FLEXERIL Take 10 mg by mouth 2 (two) times daily as needed.   diazepam 5 MG tablet Commonly known as: VALIUM Take 1-2 tablets (5-10 mg total) by mouth every 6 (six) hours as needed for muscle spasms.   docusate sodium 100 MG capsule Commonly known as: COLACE Take 100 mg by mouth daily.   DULoxetine 30 MG capsule Commonly known as: CYMBALTA Take 60 mg by mouth daily.   ergocalciferol 1.25 MG (50000 UT) capsule Commonly known as: VITAMIN D2 Take 50,000 Units by mouth every Sunday.   eucerin cream Apply 1 application topically 5 (five) times daily as needed (for dry skin throughout day as needed for handwashing).   fentaNYL 50 MCG/HR Commonly known as: DURAGESIC Place 1 patch onto the skin every 3 (three) days for 3 days. Start taking on: October 04, 2021   fluticasone 50 MCG/ACT nasal spray Commonly known as: FLONASE Place 1-2 sprays into both nostrils daily as needed. For nasal congestion.   gabapentin 300 MG capsule Commonly known as: NEURONTIN Take  300-600 mg by mouth 3 (three) times daily. Take 300 mg by mouth twice daily and 600 mg at bedtime   hydrochlorothiazide 25 MG tablet Commonly known as: HYDRODIURIL Take 25 mg by mouth daily.   HYDROcodone-acetaminophen 10-325 MG tablet Commonly known as: NORCO Take 1 tablet by mouth every 4 (four) hours as needed for up to 5 days for severe pain or moderate pain. What changed:  when to take this reasons to take this additional instructions Another medication with the same name was removed. Continue taking this medication, and follow the directions you see here.   levocetirizine 5 MG tablet Commonly known as: XYZAL Take 5 mg by mouth at bedtime.   levothyroxine 50 MCG tablet Commonly known as: SYNTHROID Take 75 mcg by mouth daily before breakfast.   metFORMIN 500 MG  tablet Commonly known as: GLUCOPHAGE Take 1,000 mg by mouth 2 (two) times daily.   metoprolol succinate 100 MG 24 hr tablet Commonly known as: TOPROL-XL Take 100 mg by mouth 2 (two) times daily.   nitrofurantoin (macrocrystal-monohydrate) 100 MG capsule Commonly known as: MACROBID Take 100 mg by mouth 2 (two) times daily.   OMEGA 3 PO Take 450 mg by mouth daily. OMEGA XL 150 MG PER CAPSULE   omeprazole 40 MG capsule Commonly known as: PRILOSEC Take 40 mg by mouth daily before breakfast.   ondansetron 4 MG tablet Commonly known as: ZOFRAN Take 4 mg by mouth every 8 (eight) hours as needed for nausea/vomiting.   Polyethylene Glycol 400 0.25 % Soln Place 1-2 drops into both eyes 3 (three) times daily as needed (for dry eyes.).   Potassium 99 MG Tabs Take 99 mg by mouth at bedtime.   predniSONE 10 MG tablet Commonly known as: DELTASONE Take 10-60 mg by mouth daily.   simvastatin 40 MG tablet Commonly known as: ZOCOR Take 40 mg by mouth at bedtime.   traZODone 100 MG tablet Commonly known as: DESYREL Take 100 mg by mouth at bedtime. 30 minutes before bedtime.   triamcinolone cream 0.1 % Commonly known as: KENALOG Apply 1 application topically 2 (two) times daily as needed (for psoriasis (typically once daily)).   Vitamin B-12 2500 MCG Subl Place 2,500 mcg under the tongue daily.               Durable Medical Equipment  (From admission, onward)           Start     Ordered   09/26/21 1452  For home use only DME 4 wheeled rolling walker with seat  Once       Question:  Patient needs a walker to treat with the following condition  Answer:  Weakness   09/26/21 1451            Follow-up Information     Edward Jolly, MD Follow up in 1 week(s).   Specialty: Pain Medicine Contact information: [address] [place] [address] [phone]         Marguarite Arbour, MD Follow up in 2 week(s).   Specialty: Internal Medicine Contact  information: [address] [facility] [place] [address] [phone]                No Known Allergies  Consultations: Neuro surg    Procedures/Studies: DG Lumbar Spine 2-3 Views  Result Date: 09/23/2021 CLINICAL DATA:  Hip and back pain.  History of lumbar fusion. EXAM: LUMBAR SPINE - 2-3 VIEW COMPARISON:  X-ray lumbar spine 09/03/2017  FINDINGS: Aplastic T12 ribs.  Five non-rib-bearing lumbar vertebral bodies. Prior L2 through L5 posterolateral and interbody fusion. Associated osseous fusion of the vertebral bodies laterally. There is no evidence of lumbar spine fracture. Alignment is grossly unremarkable with slight straightening of the normal lumbar lordosis likely due to surgical hardware fusion. Non-surgerized intervertebral disc spaces demonstrate slightly increased narrowing. Associated multilevel facet arthropathy and mild osteophyte formation. IMPRESSION: No acute displaced fracture or traumatic listhesis of the lumbar spine in a patient status post L2 through L5 posterior interbody fusion. Electronically Signed   By: Tish Frederickson M.D.   On: 09/23/2021 15:26   CT Lumbar Spine Wo Contrast  Result Date: 09/23/2021 CLINICAL DATA:  Low back pain and left hip pain. History of prior lumbar surgery. EXAM: CT LUMBAR SPINE WITHOUT CONTRAST TECHNIQUE: Multidetector CT imaging of the lumbar spine was performed without intravenous contrast administration. Multiplanar CT image reconstructions were also generated. COMPARISON:  Lumbar spine MRI 07/07/2019 FINDINGS: Segmentation: There are five lumbar type vertebral bodies. The last full intervertebral disc space is labeled L5-S1. Alignment: Normal Vertebrae: No fracture or bone lesion. Moderate artifact associated with extensive fusion hardware with posterior and interbody fusion from L2-L5. I do not see any obvious complicating features associated with the hardware. No hardware fracture or loosening. There appears to be  solid areas of interbody fusion at the disc space levels. Paraspinal and other soft tissues: No significant paraspinal or retroperitoneal findings. Disc levels: L1-2: No disc protrusions, focal moderate-sized left foraminal disc protrusion likely affecting the left L1 nerve root. This was not present on the prior MRI examination. L2-3: Posterior and interbody fusion changes. Wide decompressive laminectomy. No obvious spinal or foraminal stenosis. L3-4: Posterior and interbody fusion changes. Wide decompressive laminectomy. No obvious spinal or foraminal stenosis. L4-5: Posterior and interbody fusion changes. Wide decompressive laminectomy. No obvious spinal or foraminal stenosis. L5-S1: Moderate to advanced facet disease and 80 mild bulging annulus but no significant spinal or foraminal stenosis. IMPRESSION: 1. New moderate to large left foraminal disc protrusion at L1-2 likely affecting the left L1 nerve root. 2. Posterior and interbody fusion changes from L2-L5 without obvious complicating features. There appears to be solid areas of interbody fusion at the disc space levels. Electronically Signed   By: Rudie Meyer M.D.   On: 09/23/2021 20:12   CT PELVIS WO CONTRAST  Result Date: 09/23/2021 CLINICAL DATA:  Left hip pain. EXAM: CT PELVIS WITHOUT CONTRAST TECHNIQUE: Multidetector CT imaging of the pelvis was performed following the standard protocol without intravenous contrast. COMPARISON:  None. FINDINGS: Both hips are normally located. Mild degenerative changes. No fracture or AVN. No hip joint effusion. No periarticular fluid collections. The pubic symphysis and SI joints are intact. No pelvic fractures or bone lesions. The surrounding hip and pelvic musculature are grossly normal by CT. No obvious muscle tear or intramuscular hematoma. No significant intrapelvic abnormalities are identified. No inguinal mass or hernia. IMPRESSION: 1. Mild degenerative changes involving the hips but no fracture or AVN. 2.  No acute bony findings. 3. The surrounding hip and pelvic musculature are grossly normal by CT. No obvious muscle tear or intramuscular hematoma. Electronically Signed   By: Rudie Meyer M.D.   On: 09/23/2021 20:14   MR LUMBAR SPINE WO CONTRAST  Result Date: 09/24/2021 CLINICAL DATA:  Initial evaluation for low back pain, progressive neurologic deficit, prior surgery. Numbness and pain to the groin area and inner thighs for several days. EXAM: MRI LUMBAR SPINE WITHOUT CONTRAST TECHNIQUE: Multiplanar, multisequence  MR imaging of the lumbar spine was performed. No intravenous contrast was administered. COMPARISON:  Prior CT from 09/23/2021 as well as previous MRI from 07/07/2019. FINDINGS: Segmentation: Standard. Lowest well-formed disc space labeled the L5-S1 level. Alignment: Trace chronic retrolisthesis of L2 on L3. Alignment otherwise normal preservation of the normal lumbar lordosis. Vertebrae: Vertebral body height maintained without acute or chronic fracture. Postoperative changes from prior PLIF present at L2 through L5. Hardware better evaluated on prior CT. Underlying bone marrow signal intensity within normal limits. No discrete or worrisome osseous lesions. No abnormal marrow edema. Conus medullaris and cauda equina: Conus extends to the L1 level. Conus and cauda equina appear normal. No evidence for arachnoiditis. Paraspinal and other soft tissues: Postoperative changes from prior PLIF within the posterior paraspinous soft tissues. Small chronic postoperative collections about the laminectomy defects again seen, stable from prior MRI from 2020. No associated mass effect or inflammatory changes. Paraspinous soft tissues demonstrate no acute finding. Subcentimeter simple cyst partially visualize within the interpolar right kidney. Visualized visceral structures otherwise unremarkable. Disc levels: T11-12: Unremarkable. T12-L1: Unremarkable. L1-2: Disc desiccation. Left foraminal disc extrusion is  seen, corresponding with abnormality on prior CT (series 5, image 14). Disc material impinges upon the exiting left L1 nerve root as it courses through the left neural foramen. Associated moderate left foraminal stenosis. Underlying mild facet hypertrophy. No spinal stenosis. Right neural foramina remains widely patent. L2-3:  Prior PLIF.  No residual or recurrent stenosis. L3-4:  Prior PLIF.  No residual or recurrent stenosis. L4-5:  Prior PLIF.  No residual or recurrent stenosis. L5-S1: Disc desiccation with small central disc protrusion. Moderate to severe bilateral facet hypertrophy with associated small joint effusions. Mild narrowing of the lateral recesses bilaterally with mild bilateral L5 foraminal stenosis. No frank impingement. Appearance is relatively stable from prior. IMPRESSION: 1. Left foraminal disc extrusion at L1-2, impinging upon the exiting left L1 nerve root as it courses through the left neural foramen. Finding corresponds with abnormality on prior CT. 2. Postoperative changes from prior PLIF at L2 through L5 without residual or recurrent stenosis. 3. 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 No frank impingement. Electronically Signed   By: Rise Mu M.D.   On: 09/24/2021 03:26   DG Hip Unilat W or Wo Pelvis 2-3 Views Left  Result Date: 09/23/2021 CLINICAL DATA:  Hip and back pain.  History of lumbar fusion EXAM: DG HIP (WITH OR WITHOUT PELVIS) 2-3V LEFT COMPARISON:  None. FINDINGS: There is no evidence of hip fracture or dislocation. No acute displaced fracture or diastasis of the bones of the pelvis. There is no evidence of severe arthropathy or other focal bone abnormality. IMPRESSION: Negative for acute traumatic injury. Electronically Signed   By: Tish Frederickson M.D.   On: 09/23/2021 15:27   (Echo, Carotid, EGD, Colonoscopy, ERCP)     Subjective: Pt c/o back pain    Discharge Exam: Vitals:   10/01/21 0603 10/01/21 0809  BP: (!) 122/56 (!) 119/59  Pulse: (!) 50 (!) 54  Resp: 18 18  Temp: 98.6 F (37 C) 97.9 F (36.6 C)  SpO2: 97% 98%   Vitals:   09/30/21 1623 09/30/21 2038 10/01/21 0603 10/01/21 0809  BP: (!) 154/70 129/63 (!) 122/56 (!) 119/59  Pulse: 87 67 (!) 50 (!) 54  Resp: 14 20 18 18   Temp: (!) 97.4 F (36.3 C) 98.3 F (36.8  C) 98.6 F (37 C) 97.9 F (36.6 C)  TempSrc:  Oral Oral   SpO2: 93% 95% 97% 98%  Weight:      Height:        General: Pt is alert, awake, not in acute distress Cardiovascular: S1/S2 +, no rubs, no gallops Respiratory: CTA bilaterally, no wheezing, no rhonchi Abdominal: Soft, NT, obese, bowel sounds + Extremities: no cyanosis    The results of significant diagnostics from this hospitalization (including imaging, microbiology, ancillary and laboratory) are listed below for reference.     Microbiology: Recent Results (from the past 240 hour(s))  Resp Panel by RT-PCR (Flu A&B, Covid) Nasopharyngeal Swab     Status: None   Collection Time: 09/23/21  9:27 PM   Specimen: Nasopharyngeal Swab; Nasopharyngeal(NP) swabs in vial transport medium  Result Value Ref Range Status   SARS Coronavirus 2 by RT PCR NEGATIVE NEGATIVE Final    Comment: (NOTE) SARS-CoV-2 target nucleic acids are NOT DETECTED.  The SARS-CoV-2 RNA is generally detectable in upper respiratory specimens during the acute phase of infection. The lowest concentration of SARS-CoV-2 viral copies this assay can detect is 138 copies/mL. A negative result does not preclude SARS-Cov-2 infection and should not be used as the sole basis for treatment or other patient management decisions. A negative result may occur with  improper specimen collection/handling, submission of specimen other than nasopharyngeal swab, presence of viral mutation(s) within the areas targeted by this assay, and inadequate number of  viral copies(<138 copies/mL). A negative result must be combined with clinical observations, patient history, and epidemiological information. The expected result is Negative.  Fact Sheet for Patients:  BloggerCourse.com  Fact Sheet for Healthcare Providers:  SeriousBroker.it  This test is no t yet approved or cleared by the [place] FDA and  has been authorized for detection and/or diagnosis of SARS-CoV-2 by FDA under an Emergency Use Authorization (EUA). This EUA will remain  in effect (meaning this test can be used) for the duration of the COVID-19 declaration under Section 564(b)(1) of the Act, 21 U.S.C.section 360bbb-3(b)(1), unless the authorization is terminated  or revoked sooner.       Influenza A by PCR NEGATIVE NEGATIVE Final   Influenza B by PCR NEGATIVE NEGATIVE Final    Comment: (NOTE) The Xpert Xpress SARS-CoV-2/FLU/RSV plus assay is intended as an aid in the diagnosis of influenza from Nasopharyngeal swab specimens and should not be used as a sole basis for treatment. Nasal washings and aspirates are unacceptable for Xpert Xpress SARS-CoV-2/FLU/RSV testing.  Fact Sheet for Patients: BloggerCourse.com  Fact Sheet for Healthcare Providers: SeriousBroker.it  This test is not yet approved or cleared by the [place] FDA and has been authorized for detection and/or diagnosis of SARS-CoV-2 by FDA under an Emergency Use Authorization (EUA). This EUA will remain in effect (meaning this test can be used) for the duration of the COVID-19 declaration under Section 564(b)(1) of the Act, 21 U.S.C. section 360bbb-3(b)(1), unless the authorization is terminated or revoked.  Performed at [facility], [address] [place], [place] [address]      Labs: BNP (last 3 results) No results for input(s): BNP in the last 8760 hours. Basic Metabolic  Panel: Recent Labs  Lab 09/27/21 0744 09/28/21 0741 09/29/21 0408 09/30/21 0410 10/01/21 0418  NA 134* 136 135 135 140  K 3.5 3.3* 3.8 3.6 3.6  CL 94* 93* 96* 96* 95*  CO2 31 33* GLUCOSE 143* 130* 126* 123* 108*  BUN 31* 29* 26* 26* 28*  CREATININE 0.71 0.68 0.59 0.72 0.78  CALCIUM 9.1 9.2 8.8* 9.0 9.3   Liver Function Tests: No results for input(s): AST, ALT, ALKPHOS, BILITOT, PROT, ALBUMIN in the last 168 hours. No results for input(s): LIPASE, AMYLASE in the last 168 hours. No results for input(s): AMMONIA in the last 168 hours. CBC: Recent Labs  Lab 09/27/21 0744 09/28/21 0741 09/29/21 0408 09/30/21 0410 10/01/21 0418  WBC 8.6 8.9 9.1 9.3 12.2*  HGB 13.0 13.8 13.5 13.5 13.1  HCT 38.3 40.7 41.0 39.3 39.6  MCV 90.3 92.1 93.6 90.8 91.7  PLT 265 280 312 318 315   Cardiac Enzymes: No results for input(s): CKTOTAL, CKMB, CKMBINDEX, TROPONINI in the last 168 hours. BNP: Invalid input(s): POCBNP CBG: Recent Labs  Lab 09/29/21 2019 09/30/21 0724 09/30/21 1104 09/30/21 1609 09/30/21 2039  GLUCAP 304* 120* 221* 330* 241*   D-Dimer No results for input(s): DDIMER in the last 72 hours. Hgb A1c No results for input(s): HGBA1C in the last 72 hours. Lipid Profile No results for input(s): CHOL, HDL, LDLCALC, TRIG, CHOLHDL, LDLDIRECT in the last 72 hours. Thyroid function studies No results for input(s): TSH, T4TOTAL, T3FREE, THYROIDAB in the last 72 hours.  Invalid input(s): FREET3 Anemia work up No results for input(s): VITAMINB12, FOLATE, FERRITIN, TIBC, IRON, RETICCTPCT in the last 72 hours. Urinalysis    Component Value Date/Time   COLORURINE YELLOW (A) 09/23/2021 1545   APPEARANCEUR CLEAR (A) 09/23/2021 1545   APPEARANCEUR Clear 12/20/2014 1433   LABSPEC 1.012 09/23/2021 1545   LABSPEC 1.003 12/20/2014 1433   PHURINE 5.0 09/23/2021 1545   GLUCOSEU 50 (A) 09/23/2021 1545   GLUCOSEU Negative 12/20/2014 1433   HGBUR NEGATIVE 09/23/2021 1545    BILIRUBINUR NEGATIVE 09/23/2021 1545   BILIRUBINUR Negative 12/20/2014 1433   KETONESUR NEGATIVE 09/23/2021 1545   PROTEINUR NEGATIVE 09/23/2021 1545   NITRITE NEGATIVE 09/23/2021 1545   LEUKOCYTESUR NEGATIVE 09/23/2021 1545   LEUKOCYTESUR Negative 12/20/2014 1433   Sepsis Labs Invalid input(s): PROCALCITONIN,  WBC,  LACTICIDVEN Microbiology Recent Results (from the past 240 hour(s))  Resp Panel by RT-PCR (Flu A&B, Covid) Nasopharyngeal Swab     Status: None   Collection Time: 09/23/21  9:27 PM   Specimen: Nasopharyngeal Swab; Nasopharyngeal(NP) swabs in vial transport medium  Result Value Ref Range Status   SARS Coronavirus 2 by RT PCR NEGATIVE NEGATIVE Final    Comment: (NOTE) SARS-CoV-2 target nucleic acids are NOT DETECTED.  The SARS-CoV-2 RNA is generally detectable in upper respiratory specimens during the acute phase of infection. The lowest concentration of SARS-CoV-2 viral copies this assay can detect is 138 copies/mL. A negative result does not preclude SARS-Cov-2 infection and should not be used as the sole basis for treatment or other patient management decisions. A negative result may occur with  improper specimen collection/handling, submission of specimen other than nasopharyngeal swab, presence of viral mutation(s) within the areas targeted by this assay, and inadequate number of viral copies(<138 copies/mL). A negative result must be combined with clinical observations, patient history, and epidemiological information. The expected result is Negative.  Fact Sheet for Patients:  BloggerCourse.com  Fact Sheet for Healthcare Providers:  SeriousBroker.it  This test is no t yet approved or cleared by the [place] FDA and  has been authorized for detection and/or diagnosis of SARS-CoV-2 by FDA under an Emergency Use Authorization (EUA). This EUA will remain  in effect (meaning this test can be used) for the  duration of  the COVID-19 declaration under Section 564(b)(1) of the Act, 21 U.S.C.section 360bbb-3(b)(1), unless the authorization is terminated  or revoked sooner.       Influenza A by PCR NEGATIVE NEGATIVE Final   Influenza B by PCR NEGATIVE NEGATIVE Final    Comment: (NOTE) The Xpert Xpress SARS-CoV-2/FLU/RSV plus assay is intended as an aid in the diagnosis of influenza from Nasopharyngeal swab specimens and should not be used as a sole basis for treatment. Nasal washings and aspirates are unacceptable for Xpert Xpress SARS-CoV-2/FLU/RSV testing.  Fact Sheet for Patients: BloggerCourse.com  Fact Sheet for Healthcare Providers: SeriousBroker.it  This test is not yet approved or cleared by the [place] FDA and has been authorized for detection and/or diagnosis of SARS-CoV-2 by FDA under an Emergency Use Authorization (EUA). This EUA will remain in effect (meaning this test can be used) for the duration of the COVID-19 declaration under Section 564(b)(1) of the Act, 21 U.S.C. section 360bbb-3(b)(1), unless the authorization is terminated or revoked.  Performed at [facility], [address] [place], [place] [address]      Time coordinating discharge: Over 30 minutes  SIGNED:   Charise Killian, MD  [facility] 10/01/2021, 10:57 AM Pager   If 7PM-7AM, please contact night-coverage

## 2021-10-01 NOTE — Progress Notes (Signed)
Inpatient Diabetes Program Recommendations  AACE/ADA: New Consensus Statement on Inpatient Glycemic Control (2015)  Target Ranges:  Prepandial:   less than 140 mg/dL      Peak postprandial:   less than 180 mg/dL (1-2 hours)      Critically ill patients:  140 - 180 mg/dL  Results for SELBY, SLOVACEK (MRN [id number]) as of 10/01/2021 08:41  Ref. Range 09/30/2021 07:24 09/30/2021 11:04 09/30/2021 16:09 09/30/2021 20:39  Glucose-Capillary Latest Ref Range: 70 - 99 mg/dL 283 (H)  8 units Novolog  221 (H)  13 units Novolog  330 (H)  19 units Novolog  241 (H)  2 units Novolog      Home DM Meds: Metformin 1000 mg bid  Current Orders: Novolog Moderate Correction Scale/ SSI (0-15 units) TID AC + HS     Novolog 8 units TID with meals    MD- Note patient remains on Prednisone 80 mg daily  Having issues with elevated afternoon CBGs  Please consider increasing the Novolog Meal Coverage to 12 units TID with meals while patient remains on Prednisone    --Will follow patient during hospitalization--  Ambrose Finland RN, MSN, CDE Diabetes Coordinator Inpatient Glycemic Control Team Team Pager: [phone] (8a-5p)

## 2021-10-01 NOTE — Care Management Important Message (Signed)
Important Message  Patient Details  Name: Margaret Frye MRN: [id number] Date of Birth: [birth year]   Medicare Important Message Given:  Yes     Olegario Messier A Zianna Dercole 10/01/2021, 11:07 AM

## 2021-10-05 DIAGNOSIS — K5903 Drug induced constipation: Secondary | ICD-10-CM | POA: Diagnosis not present

## 2021-10-05 DIAGNOSIS — E7800 Pure hypercholesterolemia, unspecified: Secondary | ICD-10-CM | POA: Diagnosis not present

## 2021-10-05 DIAGNOSIS — Z09 Encounter for follow-up examination after completed treatment for conditions other than malignant neoplasm: Secondary | ICD-10-CM | POA: Diagnosis not present

## 2021-10-05 DIAGNOSIS — M5126 Other intervertebral disc displacement, lumbar region: Secondary | ICD-10-CM | POA: Diagnosis not present

## 2021-10-05 DIAGNOSIS — I10 Essential (primary) hypertension: Secondary | ICD-10-CM | POA: Diagnosis not present

## 2021-10-05 DIAGNOSIS — E118 Type 2 diabetes mellitus with unspecified complications: Secondary | ICD-10-CM | POA: Diagnosis not present

## 2021-10-09 DIAGNOSIS — E119 Type 2 diabetes mellitus without complications: Secondary | ICD-10-CM | POA: Diagnosis not present

## 2021-10-09 DIAGNOSIS — Z7984 Long term (current) use of oral hypoglycemic drugs: Secondary | ICD-10-CM | POA: Diagnosis not present

## 2021-10-09 DIAGNOSIS — E785 Hyperlipidemia, unspecified: Secondary | ICD-10-CM | POA: Diagnosis not present

## 2021-10-09 DIAGNOSIS — Z7982 Long term (current) use of aspirin: Secondary | ICD-10-CM | POA: Diagnosis not present

## 2021-10-09 DIAGNOSIS — M5126 Other intervertebral disc displacement, lumbar region: Secondary | ICD-10-CM | POA: Diagnosis not present

## 2021-10-09 DIAGNOSIS — I10 Essential (primary) hypertension: Secondary | ICD-10-CM | POA: Diagnosis not present

## 2021-10-09 DIAGNOSIS — K5903 Drug induced constipation: Secondary | ICD-10-CM | POA: Diagnosis not present

## 2021-10-09 DIAGNOSIS — E039 Hypothyroidism, unspecified: Secondary | ICD-10-CM | POA: Diagnosis not present

## 2021-10-09 DIAGNOSIS — K219 Gastro-esophageal reflux disease without esophagitis: Secondary | ICD-10-CM | POA: Diagnosis not present

## 2021-10-09 DIAGNOSIS — Z7952 Long term (current) use of systemic steroids: Secondary | ICD-10-CM | POA: Diagnosis not present

## 2021-10-12 DIAGNOSIS — M5416 Radiculopathy, lumbar region: Secondary | ICD-10-CM | POA: Diagnosis not present

## 2021-10-12 DIAGNOSIS — M5136 Other intervertebral disc degeneration, lumbar region: Secondary | ICD-10-CM | POA: Diagnosis not present

## 2021-10-12 DIAGNOSIS — M5126 Other intervertebral disc displacement, lumbar region: Secondary | ICD-10-CM | POA: Diagnosis not present

## 2021-10-15 DIAGNOSIS — M48062 Spinal stenosis, lumbar region with neurogenic claudication: Secondary | ICD-10-CM | POA: Diagnosis not present

## 2021-10-15 DIAGNOSIS — E118 Type 2 diabetes mellitus with unspecified complications: Secondary | ICD-10-CM | POA: Diagnosis not present

## 2021-10-16 DIAGNOSIS — Z7982 Long term (current) use of aspirin: Secondary | ICD-10-CM | POA: Diagnosis not present

## 2021-10-16 DIAGNOSIS — E119 Type 2 diabetes mellitus without complications: Secondary | ICD-10-CM | POA: Diagnosis not present

## 2021-10-16 DIAGNOSIS — I10 Essential (primary) hypertension: Secondary | ICD-10-CM | POA: Diagnosis not present

## 2021-10-16 DIAGNOSIS — K219 Gastro-esophageal reflux disease without esophagitis: Secondary | ICD-10-CM | POA: Diagnosis not present

## 2021-10-16 DIAGNOSIS — M5126 Other intervertebral disc displacement, lumbar region: Secondary | ICD-10-CM | POA: Diagnosis not present

## 2021-10-16 DIAGNOSIS — E039 Hypothyroidism, unspecified: Secondary | ICD-10-CM | POA: Diagnosis not present

## 2021-10-16 DIAGNOSIS — Z7984 Long term (current) use of oral hypoglycemic drugs: Secondary | ICD-10-CM | POA: Diagnosis not present

## 2021-10-25 DIAGNOSIS — Z7952 Long term (current) use of systemic steroids: Secondary | ICD-10-CM | POA: Diagnosis not present

## 2021-10-25 DIAGNOSIS — K219 Gastro-esophageal reflux disease without esophagitis: Secondary | ICD-10-CM | POA: Diagnosis not present

## 2021-10-25 DIAGNOSIS — E039 Hypothyroidism, unspecified: Secondary | ICD-10-CM | POA: Diagnosis not present

## 2021-10-25 DIAGNOSIS — Z7982 Long term (current) use of aspirin: Secondary | ICD-10-CM | POA: Diagnosis not present

## 2021-10-25 DIAGNOSIS — E119 Type 2 diabetes mellitus without complications: Secondary | ICD-10-CM | POA: Diagnosis not present

## 2021-10-25 DIAGNOSIS — Z7984 Long term (current) use of oral hypoglycemic drugs: Secondary | ICD-10-CM | POA: Diagnosis not present

## 2021-10-25 DIAGNOSIS — I10 Essential (primary) hypertension: Secondary | ICD-10-CM | POA: Diagnosis not present

## 2021-10-25 DIAGNOSIS — E785 Hyperlipidemia, unspecified: Secondary | ICD-10-CM | POA: Diagnosis not present

## 2021-10-25 DIAGNOSIS — M5126 Other intervertebral disc displacement, lumbar region: Secondary | ICD-10-CM | POA: Diagnosis not present

## 2021-10-25 DIAGNOSIS — K5903 Drug induced constipation: Secondary | ICD-10-CM | POA: Diagnosis not present

## 2021-11-05 DIAGNOSIS — M5126 Other intervertebral disc displacement, lumbar region: Secondary | ICD-10-CM | POA: Diagnosis not present

## 2021-11-05 DIAGNOSIS — M5416 Radiculopathy, lumbar region: Secondary | ICD-10-CM | POA: Diagnosis not present

## 2021-11-05 DIAGNOSIS — M48062 Spinal stenosis, lumbar region with neurogenic claudication: Secondary | ICD-10-CM | POA: Diagnosis not present

## 2021-11-05 DIAGNOSIS — M5136 Other intervertebral disc degeneration, lumbar region: Secondary | ICD-10-CM | POA: Diagnosis not present

## 2021-12-03 DIAGNOSIS — E118 Type 2 diabetes mellitus with unspecified complications: Secondary | ICD-10-CM | POA: Diagnosis not present

## 2021-12-03 DIAGNOSIS — M1710 Unilateral primary osteoarthritis, unspecified knee: Secondary | ICD-10-CM | POA: Diagnosis not present

## 2021-12-12 DIAGNOSIS — E118 Type 2 diabetes mellitus with unspecified complications: Secondary | ICD-10-CM | POA: Diagnosis not present

## 2021-12-12 DIAGNOSIS — E538 Deficiency of other specified B group vitamins: Secondary | ICD-10-CM | POA: Diagnosis not present

## 2021-12-12 DIAGNOSIS — Z79899 Other long term (current) drug therapy: Secondary | ICD-10-CM | POA: Diagnosis not present

## 2021-12-12 DIAGNOSIS — E7800 Pure hypercholesterolemia, unspecified: Secondary | ICD-10-CM | POA: Diagnosis not present

## 2021-12-12 DIAGNOSIS — E039 Hypothyroidism, unspecified: Secondary | ICD-10-CM | POA: Diagnosis not present

## 2021-12-12 DIAGNOSIS — I10 Essential (primary) hypertension: Secondary | ICD-10-CM | POA: Diagnosis not present

## 2021-12-12 DIAGNOSIS — R8290 Unspecified abnormal findings in urine: Secondary | ICD-10-CM | POA: Diagnosis not present

## 2021-12-19 DIAGNOSIS — E039 Hypothyroidism, unspecified: Secondary | ICD-10-CM | POA: Diagnosis not present

## 2021-12-19 DIAGNOSIS — E538 Deficiency of other specified B group vitamins: Secondary | ICD-10-CM | POA: Diagnosis not present

## 2021-12-19 DIAGNOSIS — G894 Chronic pain syndrome: Secondary | ICD-10-CM | POA: Diagnosis not present

## 2021-12-19 DIAGNOSIS — Z0000 Encounter for general adult medical examination without abnormal findings: Secondary | ICD-10-CM | POA: Diagnosis not present

## 2021-12-19 DIAGNOSIS — E559 Vitamin D deficiency, unspecified: Secondary | ICD-10-CM | POA: Diagnosis not present

## 2021-12-19 DIAGNOSIS — N390 Urinary tract infection, site not specified: Secondary | ICD-10-CM | POA: Diagnosis not present

## 2021-12-19 DIAGNOSIS — E118 Type 2 diabetes mellitus with unspecified complications: Secondary | ICD-10-CM | POA: Diagnosis not present

## 2021-12-19 DIAGNOSIS — E782 Mixed hyperlipidemia: Secondary | ICD-10-CM | POA: Diagnosis not present

## 2021-12-31 ENCOUNTER — Other Ambulatory Visit
Admission: RE | Admit: 2021-12-31 | Discharge: 2021-12-31 | Disposition: A | Discharge status: Home / Self Care | Payer: HMO | Source: Ambulatory Visit | Attending: Cardiology | Admitting: Cardiology

## 2021-12-31 DIAGNOSIS — R600 Localized edema: Secondary | ICD-10-CM | POA: Diagnosis not present

## 2021-12-31 DIAGNOSIS — R002 Palpitations: Secondary | ICD-10-CM | POA: Diagnosis not present

## 2021-12-31 DIAGNOSIS — I10 Essential (primary) hypertension: Secondary | ICD-10-CM | POA: Diagnosis not present

## 2021-12-31 LAB — BRAIN NATRIURETIC PEPTIDE: B Natriuretic Peptide: 89.8 pg/mL (ref 0.0–100.0)

## 2022-01-03 DIAGNOSIS — H40053 Ocular hypertension, bilateral: Secondary | ICD-10-CM | POA: Diagnosis not present

## 2022-01-07 ENCOUNTER — Other Ambulatory Visit: Payer: Self-pay

## 2022-01-07 ENCOUNTER — Ambulatory Visit: Payer: HMO | Admitting: Urology

## 2022-01-07 ENCOUNTER — Encounter: Payer: Self-pay | Admitting: Urology

## 2022-01-07 VITALS — BP 125/67 | HR 68 | Ht 64.0 in | Wt 202.0 lb

## 2022-01-07 DIAGNOSIS — N390 Urinary tract infection, site not specified: Secondary | ICD-10-CM | POA: Diagnosis not present

## 2022-01-07 LAB — MICROSCOPIC EXAMINATION: RBC, Urine: NONE SEEN /hpf (ref 0–2)

## 2022-01-07 LAB — URINALYSIS, COMPLETE
Bilirubin, UA: NEGATIVE
Glucose, UA: NEGATIVE
Ketones, UA: NEGATIVE
Leukocytes,UA: NEGATIVE
Nitrite, UA: NEGATIVE
Protein,UA: NEGATIVE
RBC, UA: NEGATIVE
Specific Gravity, UA: 1.015 (ref 1.005–1.030)
Urobilinogen, Ur: 0.2 mg/dL (ref 0.2–1.0)
pH, UA: 5 (ref 5.0–7.5)

## 2022-01-07 LAB — BLADDER SCAN AMB NON-IMAGING: Scan Result: 121

## 2022-01-07 NOTE — Patient Instructions (Signed)
Take over the counter D-Mannose and cranberry tablets daily. 

## 2022-01-07 NOTE — Progress Notes (Signed)
01/07/2022 11:08 AM   Margaret Frye [birth year] [id number]  Referring provider: Idelle Crouch, MD [facility] Margaret Frye,  Margaret Frye [id number]  Chief Complaint  Patient presents with   Recurrent UTI    HPI: Margaret Frye is a 76 y.o. female referred for evaluation of recurrent UTIs.  States she was treated for urinary tract infections in November 2022 and January 2023 Record review with urinalysis 12/12/2021 showing 10-50 WBCs and urine culture positive Klebsiella Typical symptoms consist of bladder pressure, frequency, urgency with urge incontinence and occasional dysuria No febrile UTIs or pyelonephritis Presently asymptomatic   PMH: Past Medical History:  Diagnosis Date   Anemia    Arthritis    Asthma    Borderline glaucoma    Depression    Diabetes mellitus without complication (HCC)    Edema    Elevated liver enzymes    Environmental allergies    Family history of adverse reaction to anesthesia    Daughter had PONV   GERD (gastroesophageal reflux disease)    Headache    migraine   Heart palpitations    History of hiatal hernia    Hyperlipidemia    Hypertension    Hypothyroidism    Lumbar stenosis    PONV (postoperative nausea and vomiting)    Sleep apnea    does not wear CPAP " my oxygen level drops when I sleep"   TIA (transient ischemic attack)    Vitamin B deficiency    Vitamin D deficiency    Wears glasses    Wears partial dentures     Surgical History: Past Surgical History:  Procedure Laterality Date   ABDOMINAL HYSTERECTOMY      " partial"   anterior cevical fusion     APPENDECTOMY     BACK SURGERY     lumbar laminectomy   BREAST EXCISIONAL BIOPSY Left 2000   benign   CATARACT EXTRACTION W/ INTRAOCULAR LENS  IMPLANT, BILATERAL     CHOLECYSTECTOMY     COLONOSCOPY W/ BIOPSIES     MULTIPLE TOOTH EXTRACTIONS     TUBAL LIGATION      Home Medications:  Allergies as of 01/07/2022   No Known Allergies       Medication List        Accurate as of January 07, 2022 11:08 AM. If you have any questions, ask your nurse or doctor.          albuterol 108 (90 Base) MCG/ACT inhaler Commonly known as: VENTOLIN HFA Inhale 2 puffs into the lungs every 6 (six) hours as needed for wheezing.   aspirin 81 MG chewable tablet Chew 81 mg by mouth daily.   celecoxib 200 MG capsule Commonly known as: CELEBREX Take 200 mg by mouth 2 (two) times daily.   cyclobenzaprine 10 MG tablet Commonly known as: FLEXERIL Take 10 mg by mouth 2 (two) times daily as needed.   diazepam 5 MG tablet Commonly known as: VALIUM Take 1-2 tablets (5-10 mg total) by mouth every 6 (six) hours as needed for muscle spasms.   docusate sodium 100 MG capsule Commonly known as: COLACE Take 100 mg by mouth daily.   DULoxetine 30 MG capsule Commonly known as: CYMBALTA Take 60 mg by mouth daily.   ergocalciferol 1.25 MG (50000 UT) capsule Commonly known as: VITAMIN D2 Take 50,000 Units by mouth every Sunday.   eucerin cream Apply 1 application topically 5 (five) times daily as needed (for dry skin throughout  day as needed for handwashing).   fluticasone 50 MCG/ACT nasal spray Commonly known as: FLONASE Place 1-2 sprays into both nostrils daily as needed. For nasal congestion.   gabapentin 300 MG capsule Commonly known as: NEURONTIN Take 300-600 mg by mouth 3 (three) times daily. Take 300 mg by mouth twice daily and 600 mg at bedtime   hydrochlorothiazide 25 MG tablet Commonly known as: HYDRODIURIL Take 25 mg by mouth daily.   levocetirizine 5 MG tablet Commonly known as: XYZAL Take 5 mg by mouth at bedtime.   levothyroxine 50 MCG tablet Commonly known as: SYNTHROID Take 75 mcg by mouth daily before breakfast.   metFORMIN 500 MG tablet Commonly known as: GLUCOPHAGE Take 1,000 mg by mouth 2 (two) times daily.   metoprolol succinate 100 MG 24 hr tablet Commonly known as: TOPROL-XL Take 100 mg by mouth 2  (two) times daily.   nitrofurantoin (macrocrystal-monohydrate) 100 MG capsule Commonly known as: MACROBID Take 100 mg by mouth 2 (two) times daily.   OMEGA 3 PO Take 450 mg by mouth daily. OMEGA XL 150 MG PER CAPSULE   omeprazole 40 MG capsule Commonly known as: PRILOSEC Take 40 mg by mouth daily before breakfast.   ondansetron 4 MG tablet Commonly known as: ZOFRAN Take 4 mg by mouth every 8 (eight) hours as needed for nausea/vomiting.   Polyethylene Glycol 400 0.25 % Soln Place 1-2 drops into both eyes 3 (three) times daily as needed (for dry eyes.).   Potassium 99 MG Tabs Take 99 mg by mouth at bedtime.   predniSONE 10 MG tablet Commonly known as: DELTASONE Take 10-60 mg by mouth daily.   simvastatin 40 MG tablet Commonly known as: ZOCOR Take 40 mg by mouth at bedtime.   traZODone 100 MG tablet Commonly known as: DESYREL Take 100 mg by mouth at bedtime. 30 minutes before bedtime.   triamcinolone cream 0.1 % Commonly known as: KENALOG Apply 1 application topically 2 (two) times daily as needed (for psoriasis (typically once daily)).   Vitamin B-12 2500 MCG Subl Place 2,500 mcg under the tongue daily.        Allergies: No Known Allergies  Family History: Family History  Problem Relation Age of Onset   Breast cancer Sister 26   Uterine cancer Mother    Diabetes Mother    Glaucoma Mother    Heart disease Father    Peripheral vascular disease Father     Social History:  reports that she has never smoked. She has never used smokeless tobacco. She reports that she does not drink alcohol and does not use drugs.   Physical Exam: BP 125/67    Pulse 68    Ht 5\' 4"  (1.626 m)    Wt 202 lb (91.6 kg)    BMI 34.67 kg/m   Constitutional:  Alert and oriented, No acute distress. HEENT: [place] AT, moist mucus membranes.  Trachea midline, no masses. Cardiovascular: No clubbing, cyanosis, or edema. Respiratory: Normal respiratory effort, no increased work of  breathing. Psychiatric: Normal mood and affect.  Laboratory Data:  Urinalysis Dipstick/microscopy negative   Assessment & Plan:    1. Recurrent UTI Based on lack of culture documentation does not meet the definition of recurrent UTIs by AUA practice guidelines We discussed the role of perineal hygiene, timed voiding, adequate hydration, topical vaginal estrogen, cranberry prophylaxis, and low-dose antibiotic prophylaxis. She would initially like to try cranberry and or D-mannose Does not desire to start low-dose vaginal estrogen at this time Follow-up  3 months or earlier for recurrent UTI symptoms   Abbie Sons, MD  [facility] [address], [place], [place] [address] [phone]

## 2022-01-22 DIAGNOSIS — E538 Deficiency of other specified B group vitamins: Secondary | ICD-10-CM | POA: Diagnosis not present

## 2022-01-22 DIAGNOSIS — R600 Localized edema: Secondary | ICD-10-CM | POA: Diagnosis not present

## 2022-01-24 DIAGNOSIS — H40053 Ocular hypertension, bilateral: Secondary | ICD-10-CM | POA: Diagnosis not present

## 2022-02-06 DIAGNOSIS — H40053 Ocular hypertension, bilateral: Secondary | ICD-10-CM | POA: Diagnosis not present

## 2022-02-15 DIAGNOSIS — I5032 Chronic diastolic (congestive) heart failure: Secondary | ICD-10-CM | POA: Diagnosis not present

## 2022-02-15 DIAGNOSIS — E782 Mixed hyperlipidemia: Secondary | ICD-10-CM | POA: Diagnosis not present

## 2022-02-15 DIAGNOSIS — I10 Essential (primary) hypertension: Secondary | ICD-10-CM | POA: Diagnosis not present

## 2022-02-26 DIAGNOSIS — E538 Deficiency of other specified B group vitamins: Secondary | ICD-10-CM | POA: Diagnosis not present

## 2022-03-19 ENCOUNTER — Other Ambulatory Visit: Payer: Self-pay | Admitting: Internal Medicine

## 2022-03-19 DIAGNOSIS — E118 Type 2 diabetes mellitus with unspecified complications: Secondary | ICD-10-CM | POA: Diagnosis not present

## 2022-03-19 DIAGNOSIS — E782 Mixed hyperlipidemia: Secondary | ICD-10-CM | POA: Diagnosis not present

## 2022-03-19 DIAGNOSIS — Z1231 Encounter for screening mammogram for malignant neoplasm of breast: Secondary | ICD-10-CM

## 2022-03-19 DIAGNOSIS — I10 Essential (primary) hypertension: Secondary | ICD-10-CM | POA: Diagnosis not present

## 2022-03-19 DIAGNOSIS — E039 Hypothyroidism, unspecified: Secondary | ICD-10-CM | POA: Diagnosis not present

## 2022-03-19 DIAGNOSIS — E559 Vitamin D deficiency, unspecified: Secondary | ICD-10-CM | POA: Diagnosis not present

## 2022-03-19 DIAGNOSIS — E538 Deficiency of other specified B group vitamins: Secondary | ICD-10-CM | POA: Diagnosis not present

## 2022-03-19 DIAGNOSIS — N1832 Chronic kidney disease, stage 3b: Secondary | ICD-10-CM | POA: Diagnosis not present

## 2022-03-19 DIAGNOSIS — Z79899 Other long term (current) drug therapy: Secondary | ICD-10-CM | POA: Diagnosis not present

## 2022-03-29 DIAGNOSIS — E538 Deficiency of other specified B group vitamins: Secondary | ICD-10-CM | POA: Diagnosis not present

## 2022-04-08 ENCOUNTER — Ambulatory Visit: Payer: HMO | Admitting: Urology

## 2022-04-18 ENCOUNTER — Ambulatory Visit
Admission: RE | Admit: 2022-04-18 | Discharge: 2022-04-18 | Disposition: A | Discharge status: Home / Self Care | Payer: HMO | Source: Ambulatory Visit | Attending: Internal Medicine | Admitting: Internal Medicine

## 2022-04-18 DIAGNOSIS — Z1231 Encounter for screening mammogram for malignant neoplasm of breast: Secondary | ICD-10-CM | POA: Diagnosis not present

## 2022-04-30 DIAGNOSIS — E538 Deficiency of other specified B group vitamins: Secondary | ICD-10-CM | POA: Diagnosis not present

## 2022-05-31 DIAGNOSIS — E538 Deficiency of other specified B group vitamins: Secondary | ICD-10-CM | POA: Diagnosis not present

## 2022-06-07 DIAGNOSIS — M5416 Radiculopathy, lumbar region: Secondary | ICD-10-CM | POA: Diagnosis not present

## 2022-06-07 DIAGNOSIS — M5126 Other intervertebral disc displacement, lumbar region: Secondary | ICD-10-CM | POA: Diagnosis not present

## 2022-06-13 DIAGNOSIS — E7800 Pure hypercholesterolemia, unspecified: Secondary | ICD-10-CM | POA: Diagnosis not present

## 2022-06-13 DIAGNOSIS — E559 Vitamin D deficiency, unspecified: Secondary | ICD-10-CM | POA: Diagnosis not present

## 2022-06-13 DIAGNOSIS — Z79899 Other long term (current) drug therapy: Secondary | ICD-10-CM | POA: Diagnosis not present

## 2022-06-13 DIAGNOSIS — Z1231 Encounter for screening mammogram for malignant neoplasm of breast: Secondary | ICD-10-CM | POA: Diagnosis not present

## 2022-06-13 DIAGNOSIS — G894 Chronic pain syndrome: Secondary | ICD-10-CM | POA: Diagnosis not present

## 2022-06-13 DIAGNOSIS — E538 Deficiency of other specified B group vitamins: Secondary | ICD-10-CM | POA: Diagnosis not present

## 2022-06-13 DIAGNOSIS — Z0000 Encounter for general adult medical examination without abnormal findings: Secondary | ICD-10-CM | POA: Diagnosis not present

## 2022-06-13 DIAGNOSIS — I10 Essential (primary) hypertension: Secondary | ICD-10-CM | POA: Diagnosis not present

## 2022-06-13 DIAGNOSIS — E039 Hypothyroidism, unspecified: Secondary | ICD-10-CM | POA: Diagnosis not present

## 2022-06-13 DIAGNOSIS — I5032 Chronic diastolic (congestive) heart failure: Secondary | ICD-10-CM | POA: Diagnosis not present

## 2022-06-13 DIAGNOSIS — E118 Type 2 diabetes mellitus with unspecified complications: Secondary | ICD-10-CM | POA: Diagnosis not present

## 2022-07-05 DIAGNOSIS — Z1211 Encounter for screening for malignant neoplasm of colon: Secondary | ICD-10-CM | POA: Diagnosis not present

## 2022-07-05 DIAGNOSIS — M48062 Spinal stenosis, lumbar region with neurogenic claudication: Secondary | ICD-10-CM | POA: Diagnosis not present

## 2022-07-05 DIAGNOSIS — M5126 Other intervertebral disc displacement, lumbar region: Secondary | ICD-10-CM | POA: Diagnosis not present

## 2022-07-05 DIAGNOSIS — E538 Deficiency of other specified B group vitamins: Secondary | ICD-10-CM | POA: Diagnosis not present

## 2022-07-05 DIAGNOSIS — M5416 Radiculopathy, lumbar region: Secondary | ICD-10-CM | POA: Diagnosis not present

## 2022-07-05 DIAGNOSIS — M5136 Other intervertebral disc degeneration, lumbar region: Secondary | ICD-10-CM | POA: Diagnosis not present

## 2022-08-12 DIAGNOSIS — H40053 Ocular hypertension, bilateral: Secondary | ICD-10-CM | POA: Diagnosis not present

## 2022-10-31 IMAGING — MG MM DIGITAL SCREENING BILAT W/ TOMO AND CAD
8 series · 8 of 24 positions shown · non-contrast
Comparison: Previous exam(s).

CLINICAL DATA: Screening.

EXAM:
DIGITAL SCREENING BILATERAL MAMMOGRAM WITH TOMOSYNTHESIS AND CAD
TECHNIQUE: Bilateral screening digital craniocaudal and mediolateral oblique
mammograms were obtained. Bilateral screening digital breast
tomosynthesis was performed. The images were evaluated with
computer-aided detection.

[R CC synth-2D]
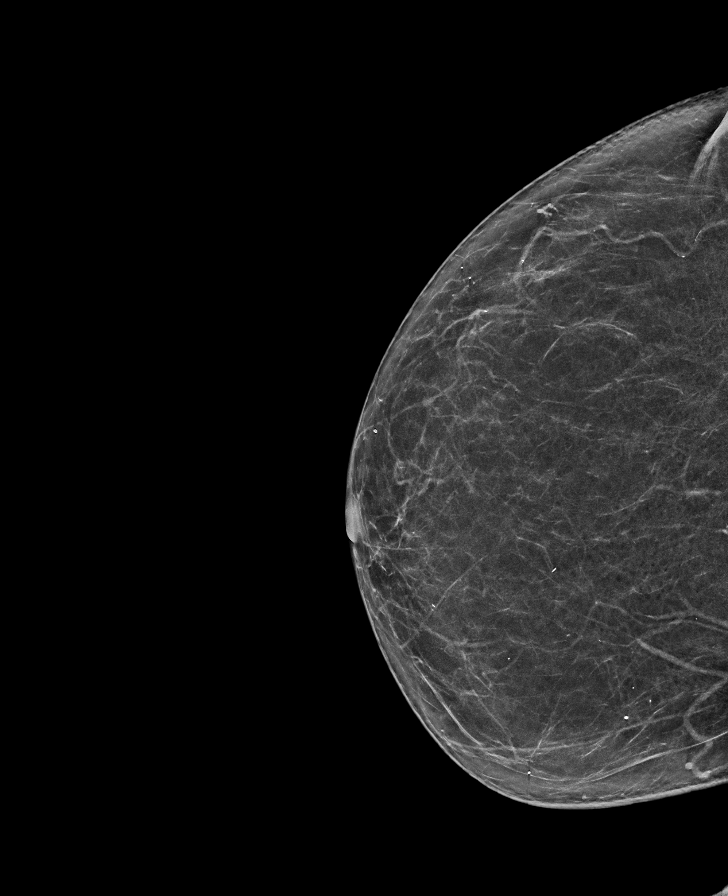

[L CC synth-2D]
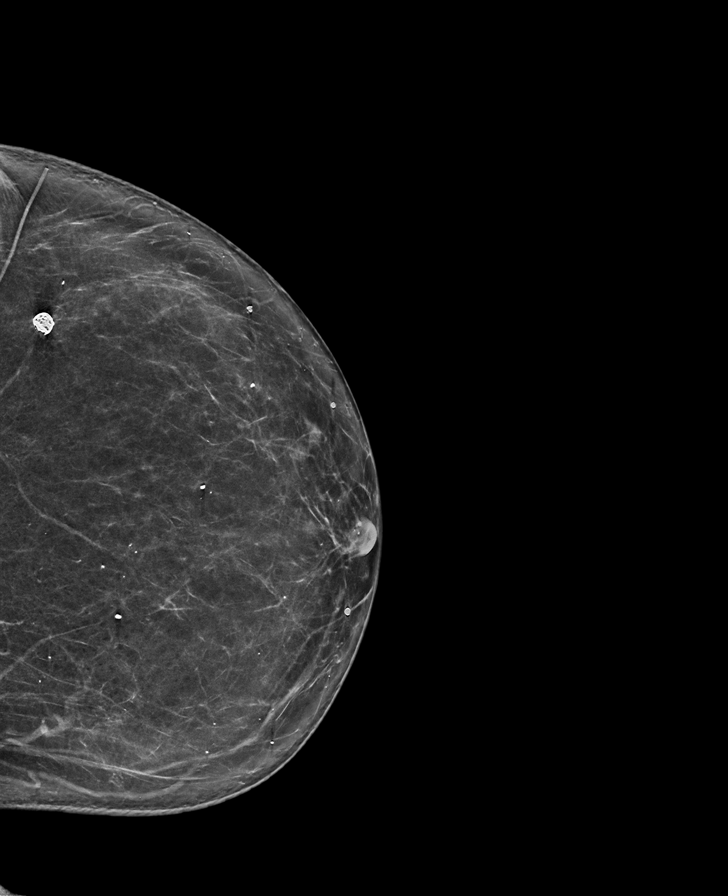

[R MLO synth-2D]
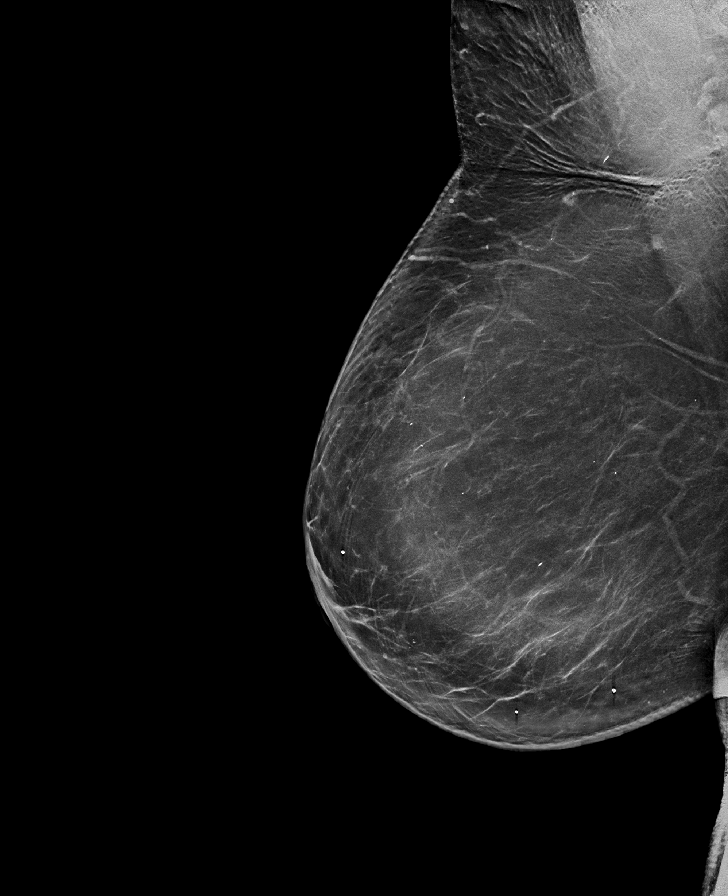

[L MLO synth-2D]
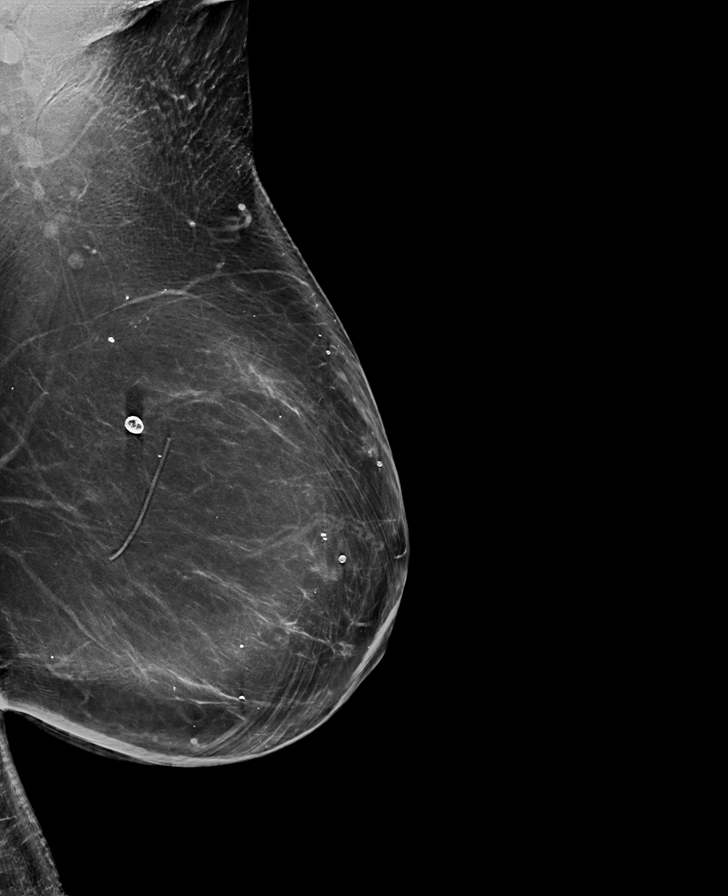

[L CC tomo · tomo slice 39/77.0]
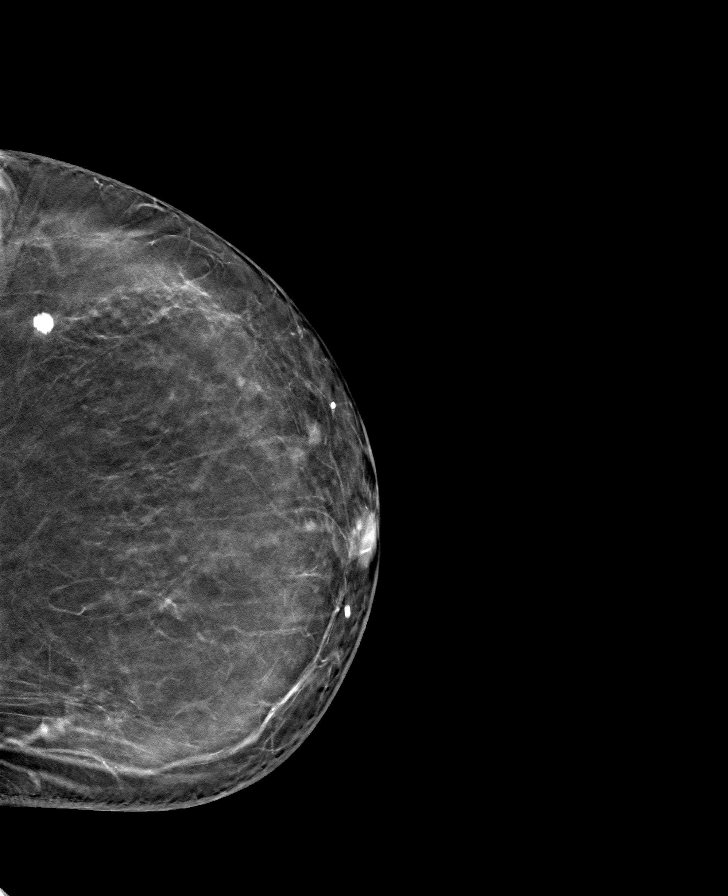

[L MLO tomo · tomo slice 49/96.0]
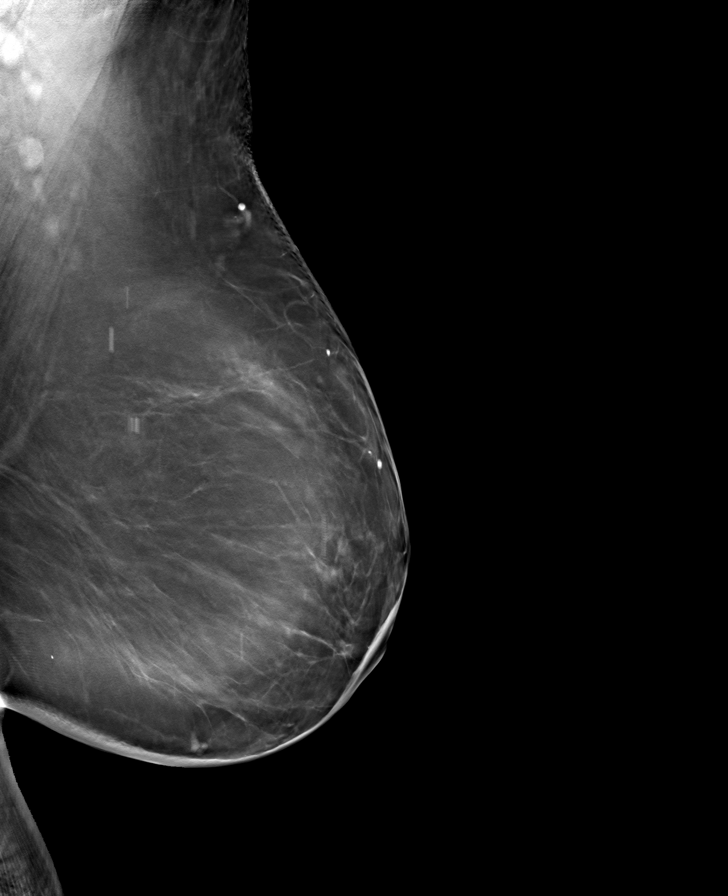

[R CC tomo · tomo slice 37/73.0]
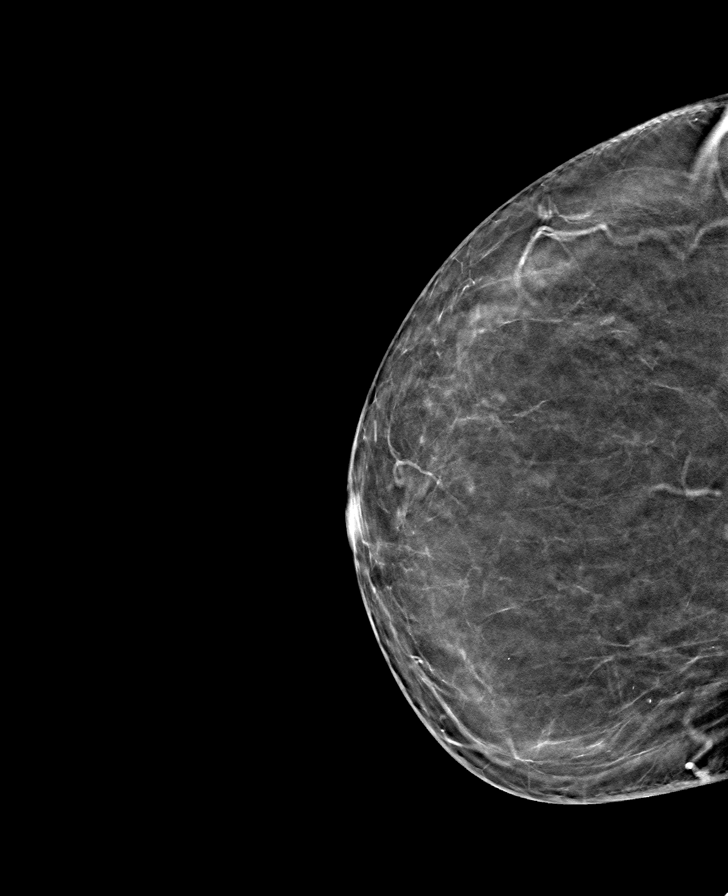

[R MLO tomo · tomo slice 47/94.0]
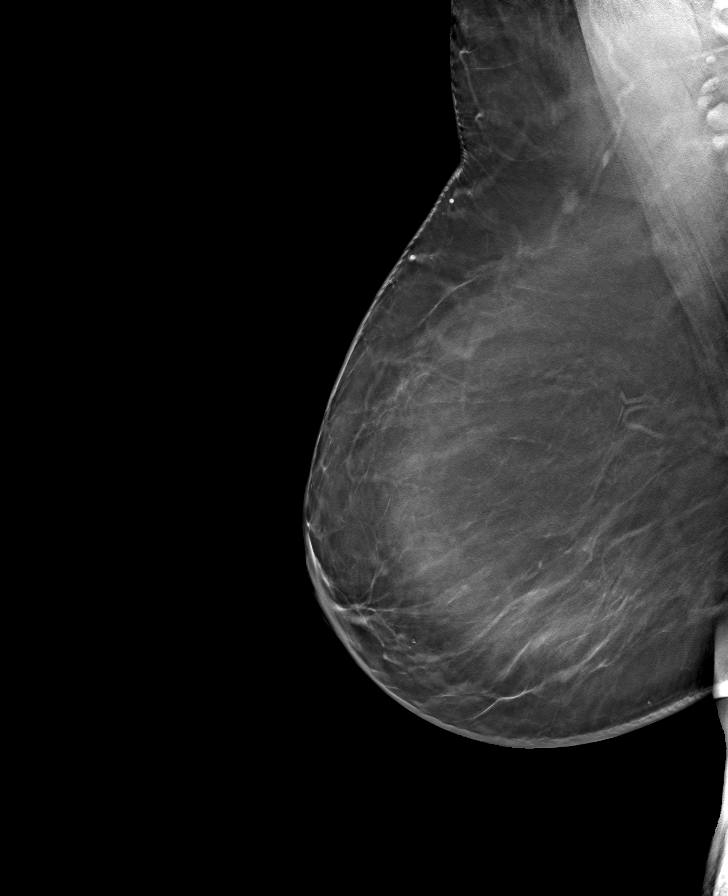

[8 of 24 positions shown; findings below may reference images not displayed]

ACR Breast Density Category b: There are scattered areas of
fibroglandular density.
FINDINGS: There are no findings suspicious for malignancy.
IMPRESSION: No mammographic evidence of malignancy. A result letter of this
screening mammogram will be mailed directly to the patient.

RECOMMENDATION:
Screening mammogram in one year. (Code:51-O-LD2)

BI-RADS CATEGORY  1: Negative.

## 2022-12-13 DIAGNOSIS — E118 Type 2 diabetes mellitus with unspecified complications: Secondary | ICD-10-CM | POA: Diagnosis not present

## 2022-12-13 DIAGNOSIS — E039 Hypothyroidism, unspecified: Secondary | ICD-10-CM | POA: Diagnosis not present

## 2022-12-13 DIAGNOSIS — Z79899 Other long term (current) drug therapy: Secondary | ICD-10-CM | POA: Diagnosis not present

## 2022-12-13 DIAGNOSIS — I10 Essential (primary) hypertension: Secondary | ICD-10-CM | POA: Diagnosis not present

## 2022-12-13 DIAGNOSIS — E7800 Pure hypercholesterolemia, unspecified: Secondary | ICD-10-CM | POA: Diagnosis not present

## 2022-12-20 DIAGNOSIS — N1832 Chronic kidney disease, stage 3b: Secondary | ICD-10-CM | POA: Diagnosis not present

## 2022-12-20 DIAGNOSIS — Z0000 Encounter for general adult medical examination without abnormal findings: Secondary | ICD-10-CM | POA: Diagnosis not present

## 2022-12-20 DIAGNOSIS — E538 Deficiency of other specified B group vitamins: Secondary | ICD-10-CM | POA: Diagnosis not present

## 2022-12-20 DIAGNOSIS — I10 Essential (primary) hypertension: Secondary | ICD-10-CM | POA: Diagnosis not present

## 2022-12-20 DIAGNOSIS — I5032 Chronic diastolic (congestive) heart failure: Secondary | ICD-10-CM | POA: Diagnosis not present

## 2022-12-20 DIAGNOSIS — M79641 Pain in right hand: Secondary | ICD-10-CM | POA: Diagnosis not present

## 2022-12-20 DIAGNOSIS — Z79899 Other long term (current) drug therapy: Secondary | ICD-10-CM | POA: Diagnosis not present

## 2022-12-20 DIAGNOSIS — E559 Vitamin D deficiency, unspecified: Secondary | ICD-10-CM | POA: Diagnosis not present

## 2022-12-20 DIAGNOSIS — E118 Type 2 diabetes mellitus with unspecified complications: Secondary | ICD-10-CM | POA: Diagnosis not present

## 2022-12-20 DIAGNOSIS — E039 Hypothyroidism, unspecified: Secondary | ICD-10-CM | POA: Diagnosis not present

## 2022-12-20 DIAGNOSIS — E782 Mixed hyperlipidemia: Secondary | ICD-10-CM | POA: Diagnosis not present

## 2022-12-23 DIAGNOSIS — E538 Deficiency of other specified B group vitamins: Secondary | ICD-10-CM | POA: Diagnosis not present

## 2023-01-30 DIAGNOSIS — E538 Deficiency of other specified B group vitamins: Secondary | ICD-10-CM | POA: Diagnosis not present

## 2023-02-07 DIAGNOSIS — R399 Unspecified symptoms and signs involving the genitourinary system: Secondary | ICD-10-CM | POA: Diagnosis not present

## 2023-02-12 DIAGNOSIS — E119 Type 2 diabetes mellitus without complications: Secondary | ICD-10-CM | POA: Diagnosis not present

## 2023-02-12 DIAGNOSIS — H40053 Ocular hypertension, bilateral: Secondary | ICD-10-CM | POA: Diagnosis not present

## 2023-02-12 DIAGNOSIS — H43813 Vitreous degeneration, bilateral: Secondary | ICD-10-CM | POA: Diagnosis not present

## 2023-02-12 DIAGNOSIS — H35363 Drusen (degenerative) of macula, bilateral: Secondary | ICD-10-CM | POA: Diagnosis not present

## 2023-02-25 DIAGNOSIS — I5032 Chronic diastolic (congestive) heart failure: Secondary | ICD-10-CM | POA: Diagnosis not present

## 2023-02-25 DIAGNOSIS — E118 Type 2 diabetes mellitus with unspecified complications: Secondary | ICD-10-CM | POA: Diagnosis not present

## 2023-02-25 DIAGNOSIS — I10 Essential (primary) hypertension: Secondary | ICD-10-CM | POA: Diagnosis not present

## 2023-02-25 DIAGNOSIS — E7800 Pure hypercholesterolemia, unspecified: Secondary | ICD-10-CM | POA: Diagnosis not present

## 2023-02-25 DIAGNOSIS — Z8673 Personal history of transient ischemic attack (TIA), and cerebral infarction without residual deficits: Secondary | ICD-10-CM | POA: Diagnosis not present

## 2023-03-03 DIAGNOSIS — E538 Deficiency of other specified B group vitamins: Secondary | ICD-10-CM | POA: Diagnosis not present

## 2023-03-24 DIAGNOSIS — I10 Essential (primary) hypertension: Secondary | ICD-10-CM | POA: Diagnosis not present

## 2023-03-24 DIAGNOSIS — Z79899 Other long term (current) drug therapy: Secondary | ICD-10-CM | POA: Diagnosis not present

## 2023-03-24 DIAGNOSIS — I5032 Chronic diastolic (congestive) heart failure: Secondary | ICD-10-CM | POA: Diagnosis not present

## 2023-03-24 DIAGNOSIS — Z1231 Encounter for screening mammogram for malignant neoplasm of breast: Secondary | ICD-10-CM | POA: Diagnosis not present

## 2023-03-24 DIAGNOSIS — E559 Vitamin D deficiency, unspecified: Secondary | ICD-10-CM | POA: Diagnosis not present

## 2023-03-24 DIAGNOSIS — G894 Chronic pain syndrome: Secondary | ICD-10-CM | POA: Diagnosis not present

## 2023-03-24 DIAGNOSIS — E538 Deficiency of other specified B group vitamins: Secondary | ICD-10-CM | POA: Diagnosis not present

## 2023-03-24 DIAGNOSIS — E039 Hypothyroidism, unspecified: Secondary | ICD-10-CM | POA: Diagnosis not present

## 2023-03-24 DIAGNOSIS — E7800 Pure hypercholesterolemia, unspecified: Secondary | ICD-10-CM | POA: Diagnosis not present

## 2023-03-24 DIAGNOSIS — E118 Type 2 diabetes mellitus with unspecified complications: Secondary | ICD-10-CM | POA: Diagnosis not present

## 2023-03-25 ENCOUNTER — Other Ambulatory Visit: Payer: Self-pay

## 2023-03-25 DIAGNOSIS — Z1231 Encounter for screening mammogram for malignant neoplasm of breast: Secondary | ICD-10-CM

## 2023-04-03 DIAGNOSIS — E538 Deficiency of other specified B group vitamins: Secondary | ICD-10-CM | POA: Diagnosis not present

## 2023-04-25 DIAGNOSIS — M5136 Other intervertebral disc degeneration, lumbar region: Secondary | ICD-10-CM | POA: Diagnosis not present

## 2023-04-25 DIAGNOSIS — M48062 Spinal stenosis, lumbar region with neurogenic claudication: Secondary | ICD-10-CM | POA: Diagnosis not present

## 2023-04-25 DIAGNOSIS — M5416 Radiculopathy, lumbar region: Secondary | ICD-10-CM | POA: Diagnosis not present

## 2023-04-25 DIAGNOSIS — M5126 Other intervertebral disc displacement, lumbar region: Secondary | ICD-10-CM | POA: Diagnosis not present

## 2023-05-02 DIAGNOSIS — M5126 Other intervertebral disc displacement, lumbar region: Secondary | ICD-10-CM | POA: Diagnosis not present

## 2023-05-02 DIAGNOSIS — M5416 Radiculopathy, lumbar region: Secondary | ICD-10-CM | POA: Diagnosis not present

## 2023-05-02 DIAGNOSIS — M48062 Spinal stenosis, lumbar region with neurogenic claudication: Secondary | ICD-10-CM | POA: Diagnosis not present

## 2023-05-08 ENCOUNTER — Ambulatory Visit
Admission: RE | Admit: 2023-05-08 | Discharge: 2023-05-08 | Disposition: A | Discharge status: Home / Self Care | Payer: PPO | Source: Ambulatory Visit | Attending: Internal Medicine | Admitting: Internal Medicine

## 2023-05-08 DIAGNOSIS — Z1231 Encounter for screening mammogram for malignant neoplasm of breast: Secondary | ICD-10-CM | POA: Insufficient documentation

## 2023-05-09 DIAGNOSIS — E538 Deficiency of other specified B group vitamins: Secondary | ICD-10-CM | POA: Diagnosis not present

## 2023-05-23 DIAGNOSIS — M5416 Radiculopathy, lumbar region: Secondary | ICD-10-CM | POA: Diagnosis not present

## 2023-05-23 DIAGNOSIS — M5136 Other intervertebral disc degeneration, lumbar region: Secondary | ICD-10-CM | POA: Diagnosis not present

## 2023-05-23 DIAGNOSIS — M48062 Spinal stenosis, lumbar region with neurogenic claudication: Secondary | ICD-10-CM | POA: Diagnosis not present

## 2023-05-23 DIAGNOSIS — M5126 Other intervertebral disc displacement, lumbar region: Secondary | ICD-10-CM | POA: Diagnosis not present

## 2023-06-18 DIAGNOSIS — E538 Deficiency of other specified B group vitamins: Secondary | ICD-10-CM | POA: Diagnosis not present

## 2023-07-10 DIAGNOSIS — R399 Unspecified symptoms and signs involving the genitourinary system: Secondary | ICD-10-CM | POA: Diagnosis not present

## 2023-07-14 DIAGNOSIS — E782 Mixed hyperlipidemia: Secondary | ICD-10-CM | POA: Diagnosis not present

## 2023-07-14 DIAGNOSIS — G894 Chronic pain syndrome: Secondary | ICD-10-CM | POA: Diagnosis not present

## 2023-07-14 DIAGNOSIS — Z79899 Other long term (current) drug therapy: Secondary | ICD-10-CM | POA: Diagnosis not present

## 2023-07-14 DIAGNOSIS — R002 Palpitations: Secondary | ICD-10-CM | POA: Diagnosis not present

## 2023-07-14 DIAGNOSIS — E118 Type 2 diabetes mellitus with unspecified complications: Secondary | ICD-10-CM | POA: Diagnosis not present

## 2023-07-14 DIAGNOSIS — E559 Vitamin D deficiency, unspecified: Secondary | ICD-10-CM | POA: Diagnosis not present

## 2023-07-14 DIAGNOSIS — Z1211 Encounter for screening for malignant neoplasm of colon: Secondary | ICD-10-CM | POA: Diagnosis not present

## 2023-07-14 DIAGNOSIS — Z0000 Encounter for general adult medical examination without abnormal findings: Secondary | ICD-10-CM | POA: Diagnosis not present

## 2023-07-14 DIAGNOSIS — I10 Essential (primary) hypertension: Secondary | ICD-10-CM | POA: Diagnosis not present

## 2023-07-14 DIAGNOSIS — E039 Hypothyroidism, unspecified: Secondary | ICD-10-CM | POA: Diagnosis not present

## 2023-07-14 DIAGNOSIS — E538 Deficiency of other specified B group vitamins: Secondary | ICD-10-CM | POA: Diagnosis not present

## 2023-07-14 DIAGNOSIS — I5032 Chronic diastolic (congestive) heart failure: Secondary | ICD-10-CM | POA: Diagnosis not present

## 2023-07-25 DIAGNOSIS — R0789 Other chest pain: Secondary | ICD-10-CM | POA: Diagnosis not present

## 2023-07-25 DIAGNOSIS — Z1211 Encounter for screening for malignant neoplasm of colon: Secondary | ICD-10-CM | POA: Diagnosis not present

## 2023-07-25 DIAGNOSIS — J90 Pleural effusion, not elsewhere classified: Secondary | ICD-10-CM | POA: Diagnosis not present

## 2023-07-25 DIAGNOSIS — I5032 Chronic diastolic (congestive) heart failure: Secondary | ICD-10-CM | POA: Diagnosis not present

## 2023-07-25 DIAGNOSIS — J811 Chronic pulmonary edema: Secondary | ICD-10-CM | POA: Diagnosis not present

## 2023-07-25 DIAGNOSIS — I509 Heart failure, unspecified: Secondary | ICD-10-CM | POA: Diagnosis not present

## 2023-08-01 DIAGNOSIS — E118 Type 2 diabetes mellitus with unspecified complications: Secondary | ICD-10-CM | POA: Diagnosis not present

## 2023-08-01 DIAGNOSIS — I5030 Unspecified diastolic (congestive) heart failure: Secondary | ICD-10-CM | POA: Diagnosis not present

## 2023-08-01 DIAGNOSIS — I10 Essential (primary) hypertension: Secondary | ICD-10-CM | POA: Diagnosis not present

## 2023-08-01 DIAGNOSIS — Z23 Encounter for immunization: Secondary | ICD-10-CM | POA: Diagnosis not present

## 2023-08-01 DIAGNOSIS — R0789 Other chest pain: Secondary | ICD-10-CM | POA: Diagnosis not present

## 2023-08-01 DIAGNOSIS — R002 Palpitations: Secondary | ICD-10-CM | POA: Diagnosis not present

## 2023-08-01 DIAGNOSIS — I498 Other specified cardiac arrhythmias: Secondary | ICD-10-CM | POA: Diagnosis not present

## 2023-08-08 DIAGNOSIS — R0789 Other chest pain: Secondary | ICD-10-CM | POA: Diagnosis not present

## 2023-08-08 DIAGNOSIS — I5030 Unspecified diastolic (congestive) heart failure: Secondary | ICD-10-CM | POA: Diagnosis not present

## 2023-08-14 DIAGNOSIS — I10 Essential (primary) hypertension: Secondary | ICD-10-CM | POA: Diagnosis not present

## 2023-08-14 DIAGNOSIS — E118 Type 2 diabetes mellitus with unspecified complications: Secondary | ICD-10-CM | POA: Diagnosis not present

## 2023-08-14 DIAGNOSIS — E782 Mixed hyperlipidemia: Secondary | ICD-10-CM | POA: Diagnosis not present

## 2023-08-14 DIAGNOSIS — I498 Other specified cardiac arrhythmias: Secondary | ICD-10-CM | POA: Diagnosis not present

## 2023-08-14 DIAGNOSIS — I5032 Chronic diastolic (congestive) heart failure: Secondary | ICD-10-CM | POA: Diagnosis not present

## 2023-08-14 DIAGNOSIS — R0602 Shortness of breath: Secondary | ICD-10-CM | POA: Diagnosis not present

## 2023-08-14 DIAGNOSIS — I340 Nonrheumatic mitral (valve) insufficiency: Secondary | ICD-10-CM | POA: Diagnosis not present

## 2023-08-15 DIAGNOSIS — H40053 Ocular hypertension, bilateral: Secondary | ICD-10-CM | POA: Diagnosis not present

## 2023-09-02 DIAGNOSIS — R0602 Shortness of breath: Secondary | ICD-10-CM | POA: Diagnosis not present

## 2023-09-02 DIAGNOSIS — I5032 Chronic diastolic (congestive) heart failure: Secondary | ICD-10-CM | POA: Diagnosis not present

## 2023-09-03 DIAGNOSIS — E538 Deficiency of other specified B group vitamins: Secondary | ICD-10-CM | POA: Diagnosis not present

## 2023-10-07 DIAGNOSIS — E538 Deficiency of other specified B group vitamins: Secondary | ICD-10-CM | POA: Diagnosis not present

## 2023-11-04 DIAGNOSIS — I5032 Chronic diastolic (congestive) heart failure: Secondary | ICD-10-CM | POA: Diagnosis not present

## 2023-11-04 DIAGNOSIS — I340 Nonrheumatic mitral (valve) insufficiency: Secondary | ICD-10-CM | POA: Diagnosis not present

## 2023-11-11 DIAGNOSIS — I10 Essential (primary) hypertension: Secondary | ICD-10-CM | POA: Diagnosis not present

## 2023-11-11 DIAGNOSIS — I340 Nonrheumatic mitral (valve) insufficiency: Secondary | ICD-10-CM | POA: Diagnosis not present

## 2023-11-11 DIAGNOSIS — E782 Mixed hyperlipidemia: Secondary | ICD-10-CM | POA: Diagnosis not present

## 2023-11-11 DIAGNOSIS — E118 Type 2 diabetes mellitus with unspecified complications: Secondary | ICD-10-CM | POA: Diagnosis not present

## 2023-11-11 DIAGNOSIS — I498 Other specified cardiac arrhythmias: Secondary | ICD-10-CM | POA: Diagnosis not present

## 2023-11-11 DIAGNOSIS — I5032 Chronic diastolic (congestive) heart failure: Secondary | ICD-10-CM | POA: Diagnosis not present

## 2023-11-13 DIAGNOSIS — E559 Vitamin D deficiency, unspecified: Secondary | ICD-10-CM | POA: Diagnosis not present

## 2023-11-13 DIAGNOSIS — E7800 Pure hypercholesterolemia, unspecified: Secondary | ICD-10-CM | POA: Diagnosis not present

## 2023-11-13 DIAGNOSIS — Z79899 Other long term (current) drug therapy: Secondary | ICD-10-CM | POA: Diagnosis not present

## 2023-11-13 DIAGNOSIS — E118 Type 2 diabetes mellitus with unspecified complications: Secondary | ICD-10-CM | POA: Diagnosis not present

## 2023-11-13 DIAGNOSIS — I10 Essential (primary) hypertension: Secondary | ICD-10-CM | POA: Diagnosis not present

## 2023-11-13 DIAGNOSIS — E538 Deficiency of other specified B group vitamins: Secondary | ICD-10-CM | POA: Diagnosis not present

## 2023-11-13 DIAGNOSIS — G894 Chronic pain syndrome: Secondary | ICD-10-CM | POA: Diagnosis not present

## 2023-11-13 DIAGNOSIS — E039 Hypothyroidism, unspecified: Secondary | ICD-10-CM | POA: Diagnosis not present

## 2023-11-13 DIAGNOSIS — E66812 Obesity, class 2: Secondary | ICD-10-CM | POA: Diagnosis not present

## 2023-12-24 DIAGNOSIS — E538 Deficiency of other specified B group vitamins: Secondary | ICD-10-CM | POA: Diagnosis not present

## 2023-12-24 DIAGNOSIS — I5032 Chronic diastolic (congestive) heart failure: Secondary | ICD-10-CM | POA: Diagnosis not present

## 2024-01-27 DIAGNOSIS — E538 Deficiency of other specified B group vitamins: Secondary | ICD-10-CM | POA: Diagnosis not present

## 2024-02-13 DIAGNOSIS — E119 Type 2 diabetes mellitus without complications: Secondary | ICD-10-CM | POA: Diagnosis not present

## 2024-02-13 DIAGNOSIS — H02403 Unspecified ptosis of bilateral eyelids: Secondary | ICD-10-CM | POA: Diagnosis not present

## 2024-02-13 DIAGNOSIS — H40053 Ocular hypertension, bilateral: Secondary | ICD-10-CM | POA: Diagnosis not present

## 2024-02-13 DIAGNOSIS — H35363 Drusen (degenerative) of macula, bilateral: Secondary | ICD-10-CM | POA: Diagnosis not present

## 2024-03-02 DIAGNOSIS — I10 Essential (primary) hypertension: Secondary | ICD-10-CM | POA: Diagnosis not present

## 2024-03-02 DIAGNOSIS — E66812 Obesity, class 2: Secondary | ICD-10-CM | POA: Diagnosis not present

## 2024-03-02 DIAGNOSIS — Z0000 Encounter for general adult medical examination without abnormal findings: Secondary | ICD-10-CM | POA: Diagnosis not present

## 2024-03-02 DIAGNOSIS — N1832 Chronic kidney disease, stage 3b: Secondary | ICD-10-CM | POA: Diagnosis not present

## 2024-03-02 DIAGNOSIS — Z79899 Other long term (current) drug therapy: Secondary | ICD-10-CM | POA: Diagnosis not present

## 2024-03-02 DIAGNOSIS — G894 Chronic pain syndrome: Secondary | ICD-10-CM | POA: Diagnosis not present

## 2024-03-02 DIAGNOSIS — E782 Mixed hyperlipidemia: Secondary | ICD-10-CM | POA: Diagnosis not present

## 2024-03-02 DIAGNOSIS — I5032 Chronic diastolic (congestive) heart failure: Secondary | ICD-10-CM | POA: Diagnosis not present

## 2024-03-02 DIAGNOSIS — E039 Hypothyroidism, unspecified: Secondary | ICD-10-CM | POA: Diagnosis not present

## 2024-03-02 DIAGNOSIS — E538 Deficiency of other specified B group vitamins: Secondary | ICD-10-CM | POA: Diagnosis not present

## 2024-03-02 DIAGNOSIS — E118 Type 2 diabetes mellitus with unspecified complications: Secondary | ICD-10-CM | POA: Diagnosis not present

## 2024-03-11 DIAGNOSIS — E782 Mixed hyperlipidemia: Secondary | ICD-10-CM | POA: Diagnosis not present

## 2024-03-11 DIAGNOSIS — R079 Chest pain, unspecified: Secondary | ICD-10-CM | POA: Diagnosis not present

## 2024-03-11 DIAGNOSIS — I5032 Chronic diastolic (congestive) heart failure: Secondary | ICD-10-CM | POA: Diagnosis not present

## 2024-03-11 DIAGNOSIS — I340 Nonrheumatic mitral (valve) insufficiency: Secondary | ICD-10-CM | POA: Diagnosis not present

## 2024-03-11 DIAGNOSIS — I10 Essential (primary) hypertension: Secondary | ICD-10-CM | POA: Diagnosis not present

## 2024-03-11 DIAGNOSIS — I493 Ventricular premature depolarization: Secondary | ICD-10-CM | POA: Diagnosis not present

## 2024-04-02 DIAGNOSIS — E538 Deficiency of other specified B group vitamins: Secondary | ICD-10-CM | POA: Diagnosis not present

## 2024-05-07 DIAGNOSIS — E538 Deficiency of other specified B group vitamins: Secondary | ICD-10-CM | POA: Diagnosis not present

## 2024-06-10 DIAGNOSIS — M5416 Radiculopathy, lumbar region: Secondary | ICD-10-CM | POA: Diagnosis not present

## 2024-06-10 DIAGNOSIS — Z79899 Other long term (current) drug therapy: Secondary | ICD-10-CM | POA: Diagnosis not present

## 2024-06-10 DIAGNOSIS — E782 Mixed hyperlipidemia: Secondary | ICD-10-CM | POA: Diagnosis not present

## 2024-06-10 DIAGNOSIS — E118 Type 2 diabetes mellitus with unspecified complications: Secondary | ICD-10-CM | POA: Diagnosis not present

## 2024-06-10 DIAGNOSIS — E66812 Obesity, class 2: Secondary | ICD-10-CM | POA: Diagnosis not present

## 2024-06-10 DIAGNOSIS — G894 Chronic pain syndrome: Secondary | ICD-10-CM | POA: Diagnosis not present

## 2024-06-10 DIAGNOSIS — I10 Essential (primary) hypertension: Secondary | ICD-10-CM | POA: Diagnosis not present

## 2024-06-10 DIAGNOSIS — E039 Hypothyroidism, unspecified: Secondary | ICD-10-CM | POA: Diagnosis not present

## 2024-06-10 DIAGNOSIS — D649 Anemia, unspecified: Secondary | ICD-10-CM | POA: Diagnosis not present

## 2024-06-10 DIAGNOSIS — E538 Deficiency of other specified B group vitamins: Secondary | ICD-10-CM | POA: Diagnosis not present

## 2024-06-21 DIAGNOSIS — D649 Anemia, unspecified: Secondary | ICD-10-CM | POA: Diagnosis not present

## 2024-06-21 DIAGNOSIS — Z1211 Encounter for screening for malignant neoplasm of colon: Secondary | ICD-10-CM | POA: Diagnosis not present

## 2024-07-15 DIAGNOSIS — E118 Type 2 diabetes mellitus with unspecified complications: Secondary | ICD-10-CM | POA: Diagnosis not present

## 2024-07-15 DIAGNOSIS — Z79899 Other long term (current) drug therapy: Secondary | ICD-10-CM | POA: Diagnosis not present

## 2024-07-22 DIAGNOSIS — Z79899 Other long term (current) drug therapy: Secondary | ICD-10-CM | POA: Diagnosis not present

## 2024-07-22 DIAGNOSIS — D649 Anemia, unspecified: Secondary | ICD-10-CM | POA: Diagnosis not present

## 2024-07-28 ENCOUNTER — Inpatient Hospital Stay

## 2024-07-28 ENCOUNTER — Encounter: Payer: Self-pay | Admitting: Oncology

## 2024-07-28 ENCOUNTER — Inpatient Hospital Stay: Attending: Oncology | Admitting: Oncology

## 2024-07-28 VITALS — BP 119/55 | HR 57 | Temp 97.9°F | Resp 18 | Ht 64.0 in | Wt 223.0 lb

## 2024-07-28 DIAGNOSIS — D649 Anemia, unspecified: Secondary | ICD-10-CM

## 2024-07-28 DIAGNOSIS — D509 Iron deficiency anemia, unspecified: Secondary | ICD-10-CM | POA: Diagnosis not present

## 2024-07-28 DIAGNOSIS — Z79899 Other long term (current) drug therapy: Secondary | ICD-10-CM | POA: Insufficient documentation

## 2024-07-28 NOTE — Progress Notes (Signed)
 [facility]  Telephone:(336) [phone] Fax:(336) [phone]  ID: Margaret Frye OB: 13-Nov-1946  MR#: [id number]  RDW#:[id number]  Patient Care Team: Auston Reyes BIRCH, MD as PCP - General (Internal Medicine)  CHIEF COMPLAINT: Iron  deficiency anemia.  INTERVAL HISTORY: Patient is a 78 year old female who was noted to have a significantly reduced hemoglobin and iron  stores and routine blood work.  She has chronic weakness and fatigue, but otherwise feels well.  She has no neurologic complaints.  She denies any recent fevers or illnesses.  She has a good appetite and denies weight loss.  She has no chest pain, shortness of breath, cough, or hemoptysis.  She denies any nausea, vomiting, constipation, or diarrhea.  She has no melena or hematochezia.  She has no urinary complaints.  Patient offers no further specific complaints today.  REVIEW OF SYSTEMS:   Review of Systems  Constitutional:  Positive for malaise/fatigue. Negative for fever and weight loss.  Respiratory: Negative.  Negative for cough, hemoptysis and shortness of breath.   Cardiovascular: Negative.  Negative for chest pain and leg swelling.  Gastrointestinal: Negative.  Negative for abdominal pain, blood in stool and melena.  Genitourinary: Negative.  Negative for dysuria.  Musculoskeletal: Negative.  Negative for back pain.  Skin: Negative.  Negative for rash.  Neurological:  Positive for weakness. Negative for dizziness, focal weakness and headaches.  Psychiatric/Behavioral: Negative.  The patient is not nervous/anxious.     As per HPI. Otherwise, a complete review of systems is negative.  PAST MEDICAL HISTORY: Past Medical History:  Diagnosis Date   Anemia    Arthritis    Asthma    Borderline glaucoma    Depression    Diabetes mellitus without complication (HCC)    Edema    Elevated liver enzymes    Environmental allergies    Family history of adverse reaction to anesthesia    Daughter had PONV   GERD  (gastroesophageal reflux disease)    Headache    migraine   Heart palpitations    History of hiatal hernia    Hyperlipidemia    Hypertension    Hypothyroidism    Lumbar stenosis    PONV (postoperative nausea and vomiting)    Sleep apnea    does not wear CPAP  my oxygen level drops when I sleep   TIA (transient ischemic attack)    Vitamin B deficiency    Vitamin D  deficiency    Wears glasses    Wears partial dentures     PAST SURGICAL HISTORY: Past Surgical History:  Procedure Laterality Date   ABDOMINAL HYSTERECTOMY       partial   anterior cevical fusion     APPENDECTOMY     BACK SURGERY     lumbar laminectomy   BREAST EXCISIONAL BIOPSY Left 2000   benign   CATARACT EXTRACTION W/ INTRAOCULAR LENS  IMPLANT, BILATERAL     CHOLECYSTECTOMY     COLONOSCOPY W/ BIOPSIES     MULTIPLE TOOTH EXTRACTIONS     TUBAL LIGATION      FAMILY HISTORY: Family History  Problem Relation Age of Onset   Breast cancer Sister 51   Uterine cancer Mother    Diabetes Mother    Glaucoma Mother    Heart disease Father    Peripheral vascular disease Father     ADVANCED DIRECTIVES (Y/N):  N  HEALTH MAINTENANCE: Social History   Tobacco Use   Smoking status: Never   Smokeless tobacco: Never  Vaping Use  Vaping status: Never Used  Substance Use Topics   Alcohol  use: No   Drug use: No     Colonoscopy:  PAP:  Bone density:  Lipid panel:  No Known Allergies  Current Outpatient Medications  Medication Sig Dispense Refill   albuterol (VENTOLIN HFA) 108 (90 Base) MCG/ACT inhaler Inhale 2 puffs into the lungs every 6 (six) hours as needed for wheezing.     aspirin  81 MG chewable tablet Chew 81 mg by mouth daily.     celecoxib (CELEBREX) 200 MG capsule Take 200 mg by mouth 2 (two) times daily.     Cyanocobalamin  (VITAMIN B-12) 2500 MCG SUBL Place 2,500 mcg under the tongue daily.     cyclobenzaprine (FLEXERIL) 10 MG tablet Take 10 mg by mouth 2 (two) times daily as needed.      docusate sodium  (COLACE) 100 MG capsule Take 100 mg by mouth daily.     empagliflozin (JARDIANCE) 10 MG TABS tablet Take 10 mg by mouth daily.     fluticasone  (FLONASE ) 50 MCG/ACT nasal spray Place 1-2 sprays into both nostrils daily as needed. For nasal congestion.     furosemide (LASIX) 40 MG tablet 1 tablet twice a day for 3 days, then 1 tablet once a day.     gabapentin  (NEURONTIN ) 300 MG capsule Take 300-600 mg by mouth 3 (three) times daily. Take 300 mg by mouth twice daily and 600 mg at bedtime     hydrochlorothiazide  (HYDRODIURIL ) 25 MG tablet Take 25 mg by mouth daily.     HYDROcodone -acetaminophen  (NORCO) 10-325 MG tablet Take 1 tablet by mouth.     isosorbide mononitrate (IMDUR) 60 MG 24 hr tablet Take 60 mg by mouth daily.     levocetirizine (XYZAL ) 5 MG tablet Take 5 mg by mouth at bedtime.     levothyroxine  (SYNTHROID , LEVOTHROID) 50 MCG tablet Take 75 mcg by mouth daily before breakfast.     magnesium citrate solution Take 296 mLs by mouth once.     metFORMIN  (GLUCOPHAGE ) 500 MG tablet Take 1,000 mg by mouth 2 (two) times daily.     metoprolol  succinate (TOPROL -XL) 100 MG 24 hr tablet Take 100 mg by mouth 2 (two) times daily.     omeprazole (PRILOSEC) 40 MG capsule Take 40 mg by mouth daily before breakfast.     ondansetron  (ZOFRAN ) 4 MG tablet Take 4 mg by mouth every 8 (eight) hours as needed for nausea/vomiting.     Polyethylene Glycol 400 0.25 % SOLN Place 1-2 drops into both eyes 3 (three) times daily as needed (for dry eyes.).     simvastatin  (ZOCOR ) 40 MG tablet Take 40 mg by mouth at bedtime.     Skin Protectants, Misc. (EUCERIN) cream Apply 1 application topically 5 (five) times daily as needed (for dry skin throughout day as needed for handwashing).     traZODone  (DESYREL ) 100 MG tablet Take 100 mg by mouth at bedtime. 30 minutes before bedtime.     triamcinolone  cream (KENALOG ) 0.1 % Apply 1 application topically 2 (two) times daily as needed (for psoriasis (typically  once daily)).     diazepam  (VALIUM ) 5 MG tablet Take 1-2 tablets (5-10 mg total) by mouth every 6 (six) hours as needed for muscle spasms. (Patient not taking: Reported on 07/28/2024) 50 tablet 0   DULoxetine  (CYMBALTA ) 30 MG capsule Take 60 mg by mouth daily. (Patient not taking: Reported on 07/28/2024)     ergocalciferol  (VITAMIN D2) 50000 units capsule Take 50,000 Units by  mouth every Sunday. (Patient not taking: Reported on 07/28/2024)     nitrofurantoin, macrocrystal-monohydrate, (MACROBID) 100 MG capsule Take 100 mg by mouth 2 (two) times daily. (Patient not taking: Reported on 07/28/2024)     Omega-3 Fatty Acids (OMEGA 3 PO) Take 450 mg by mouth daily. OMEGA XL 150 MG PER CAPSULE (Patient not taking: Reported on 07/28/2024)     Potassium 99 MG TABS Take 99 mg by mouth at bedtime. (Patient not taking: Reported on 07/28/2024)     predniSONE  (DELTASONE ) 10 MG tablet Take 10-60 mg by mouth daily. (Patient not taking: Reported on 07/28/2024)     No current facility-administered medications for this visit.    OBJECTIVE: Vitals:   07/28/24 1312  BP: (!) 119/55  Pulse: (!) 57  Resp: 18  Temp: 97.9 F (36.6 C)  SpO2: 95%     Body mass index is 38.28 kg/m.    ECOG FS:1 - Symptomatic but completely ambulatory  General: Well-developed, well-nourished, no acute distress. Eyes: Pink conjunctiva, anicteric sclera. HEENT: Normocephalic, moist mucous membranes. Lungs: No audible wheezing or coughing. Heart: Regular rate and rhythm. Abdomen: Soft, nontender, no obvious distention. Musculoskeletal: No edema, cyanosis, or clubbing. Neuro: Alert, answering all questions appropriately. Cranial nerves grossly intact. Skin: No rashes or petechiae noted. Psych: Normal affect. Lymphatics: No cervical, calvicular, axillary or inguinal LAD.   LAB RESULTS:  Lab Results  Component Value Date   NA 140 10/01/2021   K 3.6 10/01/2021   CL 95 (L) 10/01/2021   CO2 30 10/01/2021   GLUCOSE 108 (H) 10/01/2021   BUN  28 (H) 10/01/2021   CREATININE 0.78 10/01/2021   CALCIUM 9.3 10/01/2021   PROT 6.9 06/05/2017   ALBUMIN  3.9 06/05/2017   AST 50 (H) 06/05/2017   ALT 33 06/05/2017   ALKPHOS 53 06/05/2017   BILITOT 0.8 06/05/2017   GFRNONAA >60 10/01/2021   GFRAA >60 06/05/2017    Lab Results  Component Value Date   WBC 12.2 (H) 10/01/2021   NEUTROABS 5.5 09/24/2021   HGB 13.1 10/01/2021   HCT 39.6 10/01/2021   MCV 91.7 10/01/2021   PLT 315 10/01/2021     STUDIES: No results found.  ASSESSMENT: Iron  deficiency anemia.  PLAN:    Iron  deficiency anemia: Patient's most recent laboratory work at outside facility revealed a hemoglobin of 7.8, percent saturation of 8%, and ferritin of 4.  She is also symptomatic.  Unclear when patient's last colonoscopy occurred.  She will return to clinic 5 times over the next 1 to 2 weeks to receive 200 mg of IV Venofer .  She will then return to clinic in 4 months with repeat laboratory work, further evaluation, and consideration of additional treatment if needed.  I spent a total of 45 minutes reviewing chart data, face-to-face evaluation with the patient, counseling and coordination of care as detailed above.   Patient expressed understanding and was in agreement with this plan. She also understands that She can call clinic at any time with any questions, concerns, or complaints.    Evalene JINNY Reusing, MD   07/28/2024 4:26 PM

## 2024-07-28 NOTE — Progress Notes (Signed)
 Patient is having fatigue, weakness. She has shortness of breath without having to exert herself. Lightheaded, Tightness of chest with the shortness of breath ongoing for over a year. She has fluid retention, fluids around her lungs. She is on iron  pills, which is causing some constipation, with little relief.

## 2024-07-29 ENCOUNTER — Inpatient Hospital Stay

## 2024-07-29 VITALS — BP 108/61 | HR 77 | Temp 97.8°F | Resp 17

## 2024-07-29 DIAGNOSIS — D509 Iron deficiency anemia, unspecified: Secondary | ICD-10-CM | POA: Diagnosis not present

## 2024-07-29 MED ORDER — SODIUM CHLORIDE 0.9% FLUSH
10.0000 mL | Freq: Once | INTRAVENOUS | Status: AC | PRN
Start: 2024-07-29 — End: 2024-07-29
  Administered 2024-07-29: 10 mL
  Filled 2024-07-29: qty 10

## 2024-07-29 MED ORDER — IRON SUCROSE 20 MG/ML IV SOLN
200.0000 mg | Freq: Once | INTRAVENOUS | Status: AC
  Administered 2024-07-29: 200 mg via INTRAVENOUS
  Filled 2024-07-29: qty 10

## 2024-07-29 NOTE — Patient Instructions (Signed)

## 2024-07-29 NOTE — Progress Notes (Signed)
 Patient tolerated Venofer infusion well, no questions/concerns voiced. Monitored 30 min post transfusion. Patient stable at discharge. VSS. AVS given.

## 2024-08-03 ENCOUNTER — Inpatient Hospital Stay

## 2024-08-03 VITALS — BP 109/46 | HR 55 | Temp 97.1°F | Resp 17

## 2024-08-03 DIAGNOSIS — D509 Iron deficiency anemia, unspecified: Secondary | ICD-10-CM

## 2024-08-03 MED ORDER — SODIUM CHLORIDE 0.9% FLUSH
10.0000 mL | Freq: Once | INTRAVENOUS | Status: AC | PRN
  Administered 2024-08-03: 10 mL
  Filled 2024-08-03: qty 10

## 2024-08-03 MED ORDER — IRON SUCROSE 20 MG/ML IV SOLN
200.0000 mg | Freq: Once | INTRAVENOUS | Status: AC
  Administered 2024-08-03: 200 mg via INTRAVENOUS
  Filled 2024-08-03: qty 10

## 2024-08-05 ENCOUNTER — Inpatient Hospital Stay

## 2024-08-05 VITALS — BP 101/53 | HR 58 | Temp 98.6°F | Resp 18

## 2024-08-05 DIAGNOSIS — D509 Iron deficiency anemia, unspecified: Secondary | ICD-10-CM | POA: Diagnosis not present

## 2024-08-05 MED ORDER — IRON SUCROSE 20 MG/ML IV SOLN
200.0000 mg | Freq: Once | INTRAVENOUS | Status: AC
  Administered 2024-08-05: 200 mg via INTRAVENOUS
  Filled 2024-08-05: qty 10

## 2024-08-05 NOTE — Patient Instructions (Signed)

## 2024-08-09 ENCOUNTER — Emergency Department

## 2024-08-09 ENCOUNTER — Encounter: Payer: Self-pay | Admitting: Emergency Medicine

## 2024-08-09 ENCOUNTER — Other Ambulatory Visit: Payer: Self-pay

## 2024-08-09 ENCOUNTER — Inpatient Hospital Stay
Admission: EM | Admit: 2024-08-09 | Discharge: 2024-08-10 | DRG: 062 | Disposition: A | Discharge status: Other Acute Inpatient Hospital | Attending: Student in an Organized Health Care Education/Training Program | Admitting: Student in an Organized Health Care Education/Training Program

## 2024-08-09 ENCOUNTER — Inpatient Hospital Stay

## 2024-08-09 DIAGNOSIS — I493 Ventricular premature depolarization: Secondary | ICD-10-CM | POA: Diagnosis not present

## 2024-08-09 DIAGNOSIS — R29705 NIHSS score 5: Secondary | ICD-10-CM | POA: Diagnosis present

## 2024-08-09 DIAGNOSIS — R001 Bradycardia, unspecified: Secondary | ICD-10-CM | POA: Diagnosis not present

## 2024-08-09 DIAGNOSIS — I2510 Atherosclerotic heart disease of native coronary artery without angina pectoris: Secondary | ICD-10-CM | POA: Diagnosis not present

## 2024-08-09 DIAGNOSIS — E1151 Type 2 diabetes mellitus with diabetic peripheral angiopathy without gangrene: Secondary | ICD-10-CM | POA: Diagnosis not present

## 2024-08-09 DIAGNOSIS — G8194 Hemiplegia, unspecified affecting left nondominant side: Secondary | ICD-10-CM | POA: Diagnosis present

## 2024-08-09 DIAGNOSIS — Z791 Long term (current) use of non-steroidal anti-inflammatories (NSAID): Secondary | ICD-10-CM

## 2024-08-09 DIAGNOSIS — T508X5A Adverse effect of diagnostic agents, initial encounter: Secondary | ICD-10-CM | POA: Diagnosis present

## 2024-08-09 DIAGNOSIS — D539 Nutritional anemia, unspecified: Secondary | ICD-10-CM | POA: Diagnosis not present

## 2024-08-09 DIAGNOSIS — I639 Cerebral infarction, unspecified: Secondary | ICD-10-CM | POA: Diagnosis not present

## 2024-08-09 DIAGNOSIS — I63311 Cerebral infarction due to thrombosis of right middle cerebral artery: Secondary | ICD-10-CM | POA: Diagnosis not present

## 2024-08-09 DIAGNOSIS — I110 Hypertensive heart disease with heart failure: Secondary | ICD-10-CM | POA: Diagnosis not present

## 2024-08-09 DIAGNOSIS — N179 Acute kidney failure, unspecified: Secondary | ICD-10-CM | POA: Diagnosis not present

## 2024-08-09 DIAGNOSIS — D509 Iron deficiency anemia, unspecified: Secondary | ICD-10-CM | POA: Diagnosis not present

## 2024-08-09 DIAGNOSIS — E119 Type 2 diabetes mellitus without complications: Secondary | ICD-10-CM

## 2024-08-09 DIAGNOSIS — Z79899 Other long term (current) drug therapy: Secondary | ICD-10-CM

## 2024-08-09 DIAGNOSIS — R918 Other nonspecific abnormal finding of lung field: Secondary | ICD-10-CM | POA: Diagnosis not present

## 2024-08-09 DIAGNOSIS — G8190 Hemiplegia, unspecified affecting unspecified side: Secondary | ICD-10-CM | POA: Diagnosis not present

## 2024-08-09 DIAGNOSIS — R29818 Other symptoms and signs involving the nervous system: Secondary | ICD-10-CM | POA: Diagnosis not present

## 2024-08-09 DIAGNOSIS — Z8049 Family history of malignant neoplasm of other genital organs: Secondary | ICD-10-CM

## 2024-08-09 DIAGNOSIS — G4733 Obstructive sleep apnea (adult) (pediatric): Secondary | ICD-10-CM | POA: Diagnosis present

## 2024-08-09 DIAGNOSIS — E039 Hypothyroidism, unspecified: Secondary | ICD-10-CM | POA: Diagnosis not present

## 2024-08-09 DIAGNOSIS — H40009 Preglaucoma, unspecified, unspecified eye: Secondary | ICD-10-CM | POA: Diagnosis present

## 2024-08-09 DIAGNOSIS — Z90712 Acquired absence of cervix with remaining uterus: Secondary | ICD-10-CM

## 2024-08-09 DIAGNOSIS — Z833 Family history of diabetes mellitus: Secondary | ICD-10-CM

## 2024-08-09 DIAGNOSIS — R4701 Aphasia: Secondary | ICD-10-CM | POA: Diagnosis present

## 2024-08-09 DIAGNOSIS — R29810 Facial weakness: Secondary | ICD-10-CM | POA: Diagnosis not present

## 2024-08-09 DIAGNOSIS — Z7982 Long term (current) use of aspirin: Secondary | ICD-10-CM | POA: Diagnosis not present

## 2024-08-09 DIAGNOSIS — I5032 Chronic diastolic (congestive) heart failure: Secondary | ICD-10-CM | POA: Diagnosis present

## 2024-08-09 DIAGNOSIS — W19XXXA Unspecified fall, initial encounter: Secondary | ICD-10-CM | POA: Diagnosis not present

## 2024-08-09 DIAGNOSIS — I63511 Cerebral infarction due to unspecified occlusion or stenosis of right middle cerebral artery: Principal | ICD-10-CM | POA: Diagnosis present

## 2024-08-09 DIAGNOSIS — Z8249 Family history of ischemic heart disease and other diseases of the circulatory system: Secondary | ICD-10-CM

## 2024-08-09 DIAGNOSIS — J45909 Unspecified asthma, uncomplicated: Secondary | ICD-10-CM | POA: Diagnosis not present

## 2024-08-09 DIAGNOSIS — E785 Hyperlipidemia, unspecified: Secondary | ICD-10-CM | POA: Diagnosis present

## 2024-08-09 DIAGNOSIS — Z7984 Long term (current) use of oral hypoglycemic drugs: Secondary | ICD-10-CM

## 2024-08-09 DIAGNOSIS — Z83511 Family history of glaucoma: Secondary | ICD-10-CM

## 2024-08-09 DIAGNOSIS — R531 Weakness: Secondary | ICD-10-CM | POA: Diagnosis not present

## 2024-08-09 DIAGNOSIS — Z9049 Acquired absence of other specified parts of digestive tract: Secondary | ICD-10-CM

## 2024-08-09 DIAGNOSIS — R479 Unspecified speech disturbances: Secondary | ICD-10-CM | POA: Diagnosis present

## 2024-08-09 DIAGNOSIS — M1990 Unspecified osteoarthritis, unspecified site: Secondary | ICD-10-CM | POA: Diagnosis present

## 2024-08-09 DIAGNOSIS — R471 Dysarthria and anarthria: Secondary | ICD-10-CM | POA: Diagnosis present

## 2024-08-09 DIAGNOSIS — I6782 Cerebral ischemia: Secondary | ICD-10-CM | POA: Diagnosis not present

## 2024-08-09 DIAGNOSIS — I700 Atherosclerosis of aorta: Secondary | ICD-10-CM | POA: Diagnosis not present

## 2024-08-09 DIAGNOSIS — F419 Anxiety disorder, unspecified: Secondary | ICD-10-CM | POA: Diagnosis present

## 2024-08-09 DIAGNOSIS — F32A Depression, unspecified: Secondary | ICD-10-CM | POA: Diagnosis present

## 2024-08-09 DIAGNOSIS — R609 Edema, unspecified: Secondary | ICD-10-CM | POA: Diagnosis not present

## 2024-08-09 DIAGNOSIS — Z79890 Hormone replacement therapy (postmenopausal): Secondary | ICD-10-CM

## 2024-08-09 DIAGNOSIS — Z8673 Personal history of transient ischemic attack (TIA), and cerebral infarction without residual deficits: Secondary | ICD-10-CM

## 2024-08-09 LAB — CBC WITH DIFFERENTIAL/PLATELET

## 2024-08-09 LAB — COMPREHENSIVE METABOLIC PANEL WITH GFR
ALT: 9 U/L (ref 0–44)
AST: 21 U/L (ref 15–41)
Albumin: 3.1 g/dL — ABNORMAL LOW (ref 3.5–5.0)
Alkaline Phosphatase: 43 U/L (ref 38–126)
Anion gap: 14 (ref 5–15)
BUN: 18 mg/dL (ref 8–23)
CO2: 27 mmol/L (ref 22–32)
Calcium: 8.8 mg/dL — ABNORMAL LOW (ref 8.9–10.3)
Chloride: 100 mmol/L (ref 98–111)
Creatinine, Ser: 1.18 mg/dL — ABNORMAL HIGH (ref 0.44–1.00)
GFR, Estimated: 48 mL/min — ABNORMAL LOW (ref >60)
Glucose, Bld: 119 mg/dL — ABNORMAL HIGH (ref 70–99)
Potassium: 3.6 mmol/L (ref 3.5–5.1)
Sodium: 141 mmol/L (ref 135–145)
Total Bilirubin: 0.9 mg/dL (ref 0.0–1.2)
Total Protein: 6.1 g/dL — ABNORMAL LOW (ref 6.5–8.1)

## 2024-08-09 LAB — APTT: aPTT: 29 s (ref 24–36)

## 2024-08-09 LAB — PROTIME-INR
INR: 1.1 (ref 0.8–1.2)
Prothrombin Time: 14.4 s (ref 11.4–15.2)

## 2024-08-09 LAB — CBG MONITORING, ED: Glucose-Capillary: 135 mg/dL — ABNORMAL HIGH (ref 70–99)

## 2024-08-09 MED ORDER — TENECTEPLASE FOR STROKE
PACK | INTRAVENOUS | Status: AC
  Administered 2024-08-09: 25 mg via INTRAVENOUS
  Filled 2024-08-09: qty 10

## 2024-08-09 MED ORDER — IOHEXOL 350 MG/ML SOLN
75.0000 mL | Freq: Once | INTRAVENOUS | Status: AC | PRN
  Administered 2024-08-09: 75 mL via INTRAVENOUS

## 2024-08-09 MED ORDER — ACETAMINOPHEN 325 MG PO TABS: 650.0000 mg | ORAL_TABLET | ORAL | Status: DC | PRN

## 2024-08-09 MED ORDER — SODIUM CHLORIDE 0.9 % IV SOLN: INTRAVENOUS | Status: DC

## 2024-08-09 MED ORDER — PANTOPRAZOLE SODIUM 40 MG IV SOLR
40.0000 mg | Freq: Every day | INTRAVENOUS | Status: DC
  Administered 2024-08-09: 40 mg via INTRAVENOUS
  Filled 2024-08-09: qty 10

## 2024-08-09 MED ORDER — SENNOSIDES-DOCUSATE SODIUM 8.6-50 MG PO TABS: 1.0000 | ORAL_TABLET | Freq: Every evening | ORAL | Status: DC | PRN

## 2024-08-09 MED ORDER — ACETAMINOPHEN 650 MG RE SUPP: 650.0000 mg | RECTAL | Status: DC | PRN

## 2024-08-09 MED ORDER — TENECTEPLASE FOR STROKE: 0.2500 mg/kg | PACK | Freq: Once | INTRAVENOUS | Status: AC

## 2024-08-09 MED ORDER — STROKE: EARLY STAGES OF RECOVERY BOOK: Freq: Once | Status: DC

## 2024-08-09 MED ORDER — ACETAMINOPHEN 160 MG/5ML PO SOLN: 650.0000 mg | ORAL | Status: DC | PRN

## 2024-08-09 NOTE — ED Notes (Signed)
 Pt arrived to [facility] via EMS.

## 2024-08-09 NOTE — ED Triage Notes (Signed)
 Pt arrived via [facility] from home with sudden onset of left sided facial droop, left upper extremity weakness without drift. 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 Husband seen pt @ 1930 and pt was at her baseline. At 2000, symptoms began.    LVO 2 LKW: 1930 On-set of symptoms: 2000

## 2024-08-09 NOTE — Consult Note (Signed)
 [facility] Consult Services   Patient Name:   Margaret Frye, Margaret Frye Date of Birth:   1946-09-16 Identification Number:   MRN - [id number] Date of Service:   08/09/2024 22:12:14  Diagnosis:       I63.89 - Cerebrovascular accident (CVA) due to other mechanism [facility])  Impression:      78yoF hx of DM, CVA, CHF, hypothyroidism present with sudden onset left sided weakness and numbness. CT head neg for acute finding. On exam patient has LUE/LLE drift, left facial droop, mild dysarthria, and LUE sensory deficit.    Contraindication for thrombolytics reviewed with patient, none reported. Discussed risk and benefit of IV thrombolytic, including risks of 3-6 % hemorrhagic complication. Inform the patient that thrombolytics medicine is time sensitive and may not be candidate at later time. The patient demonstrated understanding and agreed to receiving thrombolytics. TNK was administered at 2247.    CTA head/neck ordered, will follow up on result.    Recommend admission for post TNK care and stroke workup.  Our recommendations are outlined below. Recommendations: IV Tenecteplase  recommended.  I confirmed the following. (Patient name, DOB, MRN, Blood Pressure, dose of Thrombolytic and waste, weight completed by stretcher/scale not stated weight, have ED staff inform ED MD of thrombolytic decision) Thrombolytic bolus given Without Complication.   IV Tenecteplase  Total Dose - 25.0 mg (Dose Rounding Per Facility Protocol)   Routine post Thrombolytic monitoring including neuro checks and blood pressure control during/after treatment Monitor blood pressure Check blood pressure and neuro assessment every 15 min for 2 h, then every 30 min for 6 h, and finally every hour for 16 h.  Manage Blood Pressure per post Thrombolytic protocol.        Follow designated hospital protocol for admission and post thrombolytic care       CT brain 24 hours post Thrombolytic       NPO until  swallowing screen performed and passed       No antiplatelet agents or anticoagulants (including heparin  for DVT prophylaxis) in first 24 hours       No Foley catheter, nasogastric tube, arterial catheter or central venous catheter for 24 hr, unless absolutely necessary       Telemetry       Bedside swallow evaluation       HOB less than 30 degrees       Euglycemia       Avoid hyperthermia, PRN acetaminophen        DVT prophylaxis       Inpatient Neurology Consultation       Stroke evaluation as per inpatient neurology recommendations  Discussed with ED physician    Addendum:  CTA head/neck report was reviewed at 1153, Impression: 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  Contact ED immediately. Spoke with Dr. Nicholaus who received check out about the patient and has been in contact with ICU NP Ouma. Base on message, NP is already on the phone with interventional radiologist, who has reviewed the CT scan and they are in process of transferring the patient for intervention after CTP.   CTP reviewed around 2218. Penumbra at right parietal region that would correlate with CTA finding. Some penumbra at left hemisphere may be artifact.  Inform by ED physician that patient NIHSS went from 12 - 22 after the scan and SBP drop to 70s. There is no obvious hemorrhage on CT perfusion. NIHSS score of 12 is higher than my initial exam.   ------------------------------------------------------------------------------  Advanced Imaging:  Advanced imaging has been ordered. Results pending.   Metrics: Last Known Well: 08/09/2024 18:30:00 Dispatch Time: 08/09/2024 22:12:14 Arrival Time: 08/09/2024 22:10:00 Initial Response Time: 08/09/2024 22:18:20 Symptoms: left side weakness . Initial patient interaction: 08/09/2024 22:20:02 NIHSS Assessment Completed: 08/09/2024 22:31:07 Patient is a candidate for Thrombolytic. Thrombolytic Medical Decision: 08/09/2024 22:34:37 Needle Time:  08/09/2024 22:47:58 Weight Noted by Staff: 103.4 kg  CT Head: CT head unremarkable for acute infarction or hemorrhage per Radiology: IMPRESSION: 1. No evidence of acute intracranial abnormality. ASPECTS is 10. 2. Chronic microvascular ischemic disease.  I personally reviewed all the CT images that were available to me and it showed: no acute finding  Primary Provider Notified of Diagnostic Impression and Management Plan on: 08/09/2024 22:51:12    ------------------------------------------------------------------------------  Thrombolytic Contraindications:  Last Known Well > 4.5 hours: No CT Head showing hemorrhage: No Ischemic stroke within 3 months: No Severe head trauma within 3 months: No Intracranial/intraspinal surgery within 3 months: No History of intracranial hemorrhage: No Symptoms and signs consistent with an SAH: No GI malignancy or GI bleed within 21 days: No Coagulopathy: Platelets <100 000 /mm3, INR >1.7, aPTT>40 s, or PT >15 s: No Treatment dose of LMWH within the previous 24 hrs: No Use of NOACs in past 48 hours: No Glycoprotein IIb/IIIa receptor inhibitors use: No Symptoms consistent with infective endocarditis: No Suspected aortic arch dissection: No Intra-axial intracranial neoplasm: No  Thrombolytic Decision and Management Plan: Management with thrombolytic treatment was explained to the Patient as was risks and benefits and alternatives to the treatment. Patient agrees with the decision to proceed with thrombolytic treatment. . All questions were answered and the Patient expressed understanding of the treatment plan.   History of Present Illness: Patient is a 78 year old Female.  Patient was brought by private transportation with symptoms of left side weakness . Patient said at baseline she ambulate with a walker. She had history of stroke 10-12 years ago without residual deficit. She sat down in the chair around 6:30pm to 7PM at baseline. Around  7:30PM she tried to get up and realized her left side is weak and couldn't get out of the chair. Sometimes after 8pm she fell, denies striking her head. Reportedly after she fell she had transient aphasia.  Denies history of stroke in past 3 months. Denies history of intracranial hemorrhage Denies bleeding concerning in the past 3 weeks Denies taking blood thinner Denies recent surgery in past 3 month.     Past Medical History:      Stroke Other PMH:  DM, CVA, CHF, hypothyroidism  Medications:  No Anticoagulant use  Antiplatelet use: Yes aspirin  81mg  Reviewed EMR for current medications  Allergies:  Reviewed,NKDA  Social History: Smoking: No  Family History:  There is no family history of premature cerebrovascular disease pertinent to this consultation  ROS : 14 Points Review of Systems was performed and was negative except mentioned in HPI.  Past Surgical History: There Is No Surgical History Contributory To Today's Visit   Examination: BP(127/57), Pulse(72), Blood Glucose(135) 1A: Level of Consciousness - Alert; keenly responsive + 0 1B: Ask Month and Age - Both Questions Right + 0 1C: Blink Eyes & Squeeze Hands - Performs Both Tasks + 0 2: Test Horizontal Extraocular Movements - Normal + 0 3: Test Visual Fields - No Visual Loss + 0 4: Test Facial Palsy (Use Grimace if Obtunded) - Minor paralysis (flat nasolabial fold, smile asymmetry) + 1 5A: Test Left Arm Motor Drift - Drift, but doesn't  hit bed + 1 5B: Test Right Arm Motor Drift - No Drift for 10 Seconds + 0 6A: Test Left Leg Motor Drift - Drift, but doesn't hit bed + 1 6B: Test Right Leg Motor Drift - No Drift for 5 Seconds + 0 7: Test Limb Ataxia (FNF/Heel-Shin) - No Ataxia + 0 8: Test Sensation - Mild-Moderate Loss: Less Sharp/More Dull + 1 9: Test Language/Aphasia - Normal; No aphasia + 0 10: Test Dysarthria - Mild-Moderate Dysarthria: Slurring but can be understood + 1 11: Test Extinction/Inattention -  No abnormality + 0  NIHSS Score: 5 NIHSS Free Text : sensation decrease on the left arm  Pre-Morbid Modified Rankin Scale: 3 Points = Moderate disability; requiring some help, but able to walk without assistance  Spoke with : Dr. Dicky (on video) I reviewed the available imaging via Rapid and initiated discussion with the primary provider  This consult was conducted in real time using interactive audio and Immunologist. Patient was informed of the technology being used for this visit and agreed to proceed. Patient located in hospital and provider located at home/office setting.   Patient is being evaluated for possible acute neurologic impairment and high probability of imminent or life-threatening deterioration. I spent total of 40 minutes providing care to this patient, including time for face to face visit via telemedicine, review of medical records, imaging studies and discussion of findings with providers, the patient and/or family.     Dr Shirlee Pam   [facility] For Inpatient follow-up with [facility] physician please call [facility] at [phone]. As we are not an outpatient service for any post hospital discharge needs please contact the hospital for assistance.  If you have any questions for the [facility] physicians or need to reconsult for clinical or diagnostic changes please contact us  via [facility] at [phone].   Signature : Shirlee Pam

## 2024-08-09 NOTE — ED Notes (Signed)
Returned from CT with RN.  

## 2024-08-09 NOTE — ED Provider Notes (Signed)
 [facility] Provider Note    Event Date/Time   First MD Initiated Contact with Patient 08/09/24 2213     (approximate)   History     HPI  Margaret Frye is a 78 y.o. female with a history of chronic anemia.  The patient does report use of aspirin  but no other blood thinners.  Review of medical record does not show use of active anticoagulant.  Patient noticed some weakness in the left hand and some difficulty with speaking started suddenly about 7:30 PM  Preactivated as a potential code stroke by EMS   No other recent illness.  No chest pain.  Physical Exam   Triage Vital Signs: ED Triage Vitals  Encounter Vitals Group     BP      Girls Systolic BP Percentile      Girls Diastolic BP Percentile      Boys Systolic BP Percentile      Boys Diastolic BP Percentile      Pulse      Resp      Temp      Temp src      SpO2      Weight      Height      Head Circumference      Peak Flow      Pain Score      Pain Loc      Pain Education      Exclude from Growth Chart     Most recent vital signs: Vitals:   08/09/24 2303 08/09/24 2319  BP: (!) 109/54 100/81  Pulse: (!) 56 62  Resp: 14 20  SpO2: 97% 92%     General: Awake, no distress.  Pleasant Defer to neurologist for full NIH stroke scale but patient has very mild left facial droop mild weakness left arm left leg.  Reports she was having difficulty with speech earlier but that seems to be improving but weakness on the left still persisting CV:  Good peripheral perfusion.  Normal tone Resp:  Normal effort.  Abd:  No distention.  Soft nontender Other:  Normocephalic atraumatic.  No evidence of obvious trauma or injury  Fully alert, sitting up conversant.  No distress   ED Results / Procedures / Treatments   Labs (all labs ordered are listed, but only abnormal results are displayed) Labs Reviewed  CBG MONITORING, ED - Abnormal; Notable for the following components:      Result Value    Glucose-Capillary 135 (*)    All other components within normal limits  PROTIME-INR  APTT  CBC  DIFFERENTIAL  COMPREHENSIVE METABOLIC PANEL WITH GFR  ETHANOL  LIPID PANEL  HEMOGLOBIN A1C  CBG MONITORING, ED  I-STAT CREATININE, ED     EKG  Pending, Dr. Nicholaus to follow-up   RADIOLOGY  CT HEAD CODE STROKE WO CONTRAST Result Date: 08/09/2024 CLINICAL DATA:  Code stroke.  Neuro deficit, acute, stroke suspected EXAM: CT HEAD WITHOUT CONTRAST TECHNIQUE: Contiguous axial images were obtained from the base of the skull through the vertex without intravenous contrast. RADIATION DOSE REDUCTION: This exam was performed according to the departmental dose-optimization program which includes automated exposure control, adjustment of the mA and/or kV according to patient size and/or use of iterative reconstruction technique. COMPARISON:  CT head 12/20/2014. FINDINGS: Brain: No evidence of acute infarction, hemorrhage, hydrocephalus, extra-axial collection or mass lesion/mass effect. Patchy white matter hypodensities are compatible with chronic microvascular disease. Vascular: No hyperdense vessel. Skull: No acute  fracture. Sinuses/Orbits: Clear sinuses.  No acute orbital findings. Other: No mastoid effusions ASPECTS [facility] Stroke Program Early CT Score) Total score (0-10 with 10 being normal): 10. Other: Attempted to call report at the time of dictation. IMPRESSION: 1. No evidence of acute intracranial abnormality. ASPECTS is 10. 2. Chronic microvascular ischemic disease. Code stroke imaging results were communicated on 08/09/2024 at 10:40 pm to provider Dr. Dicky via telephone. Electronically Signed   By: Gilmore GORMAN Molt M.D.   On: 08/09/2024 22:41     CT head interpreted by me as grossly negative for acute intracranial hemorrhage.  Discussed with radiology   PROCEDURES:  Critical Care performed: Yes, see critical care procedure note(s)  Procedures CRITICAL CARE Performed by: Oneil Dicky   Total critical care time: 25 minutes  Critical care time was exclusive of separately billable procedures and treating other patients.  Critical care was necessary to treat or prevent imminent or life-threatening deterioration.  Critical care was time spent personally by me on the following activities: development of treatment plan with patient and/or surrogate as well as nursing, discussions with consultants, evaluation of patient's response to treatment, examination of patient, obtaining history from patient or surrogate, ordering and performing treatments and interventions, ordering and review of laboratory studies, ordering and review of radiographic studies, pulse oximetry and re-evaluation of patient's condition.   MEDICATIONS ORDERED IN ED: Medications   stroke: early stages of recovery book (has no administration in time range)  0.9 %  sodium chloride  infusion (has no administration in time range)  acetaminophen  (TYLENOL ) tablet 650 mg (has no administration in time range)    Or  acetaminophen  (TYLENOL ) 160 MG/5ML solution 650 mg (has no administration in time range)    Or  acetaminophen  (TYLENOL ) suppository 650 mg (has no administration in time range)  senna-docusate (Senokot-S) tablet 1 tablet (has no administration in time range)  pantoprazole  (PROTONIX ) injection 40 mg (has no administration in time range)  tenecteplase  (TNKASE ) injection for Stroke 25 mg (25 mg Intravenous Given 08/09/24 2247)  iohexol  (OMNIPAQUE ) 350 MG/ML injection 75 mL (75 mLs Intravenous Contrast Given 08/09/24 2316)     IMPRESSION / MDM / ASSESSMENT AND PLAN / ED COURSE  I reviewed the triage vital signs and the nursing notes.                              Differential diagnosis includes, but is not limited to, possible acute stroke, intracranial hemorrhage, thrombus, other central neurologic deficit, electrolyte abnormality, hypoglycemia, etc.  No acute cardiopulmonary symptoms  Patient's  presentation is most consistent with acute presentation with potential threat to life or bodily function.  The patient is on the cardiac monitor to evaluate for evidence of arrhythmia and/or significant heart rate changes.   Clinical Course as of 08/09/24 2329  Mon Aug 09, 2024  2213 I saw and evaluated the patient on arrival with EMS.  Patient proceeding directly to CT scanner with teleneurology activated for concerns of acute stroke still within a potential thrombolytic window.  Code stroke process has been initiated  The patient is alert and oriented.  She is in no acute distress or extremis [MQ]  2236 Patient currently in CT scanner, awaiting consult recommendations from teleneurology service [MQ]  2241 Teleneurology physician called.  They are recommending the patient be treated with thrombolytic.  Patient is currently within the thrombolytic treatment window, teleneurology physician advises they have reviewed risks benefits and consented the  patient.  They are ordering thrombolytic at this time.  They do advise that we should order CT angiography to exclude LVO but feel it is unlikely to represent large vessel occlusion based on exam at this time. [MQ]  2242 Radiology physician has called notified me that CT head is without acute hemorrhage. No acute findings on CT head per radiology.  [MQ]  2305 Received signout. Acute stroke. Received TNK. Going to ICU. [ ]  CTA [HD]    Clinical Course User Index [HD] Nicholaus Rolland BRAVO, MD [MQ] Dicky Anes, MD   Patient accepted to the critical care medicine service with a presumptive diagnosis of acute stroke.  Much of her workup is still however pending including CBC metabolic panel and impending CT angiography of the head and neck to exclude LVO.  ICU team accepting of admission will follow-up on pending studies.  Patient and family at the bedside understand agreeable plan for admission  FINAL CLINICAL IMPRESSION(S) / ED DIAGNOSES   Final diagnoses:   Acute ischemic stroke [facility])     Rx / DC Orders   ED Discharge Orders     None        Note:  This document was prepared using Dragon voice recognition software and may include unintentional dictation errors.   Dicky Anes, MD 08/09/24 2329

## 2024-08-09 NOTE — ED Notes (Signed)
 Pt transported to CT with RN

## 2024-08-09 NOTE — H&P (Incomplete)
 NAME:  Margaret Frye, MRN:  [id number], DOB:  1946-03-28, LOS: 0 ADMISSION DATE:  08/09/2024, CONSULTATION DATE:  08/09/24 REFERRING MD: Dicky Anes CHIEF COMPLAINT: Left sided weakness and numbness,slurred speech   HPI  78 y.o Female with significant PMH of IDA, Anxiety and Depression, HTN, HLD, Hypothyroidism, OSA, T2DM, HFpEF, PVD, PVCs, TIA, who presented to the ED as a code stroke.  According to the patient's husband, who was at the bedside, the patient was sitting in her recliner when he asked her to charge his phone and briefly left the room. He returned about five minutes later and found her struggling to adjust the recliner and appearing confused. He observed that she was slumped to the left, with left facial droop and drooling. When he tried to speak with her, her speech was slurred and difficult to understand. The patient then stood up and fell to the floor. Her husband immediately called EMS.   ED Course: Initial vital signs showed HR of beats/minute, BP mm Hg, the RR 30 breaths/minute, and the oxygen saturation % on and a temperature of 98.11F (36.9C). Code stroke was initiated and patient evaluated by teleneurologist. A CT head neg for acute finding. On exam patient had LUE/LLE drift, left facial droop, mild to moderate dysarthria, and left sided sensory deficit. Initial NIHSS was 5. Contraindication for thrombolytics was reviewed with patient and family at the bedside. Risk and benefit of IV thrombolytic was discussed and they consented to TNK administration. TNK was administered at 2247. Patient tolerated tPA without complication.  Pertinent Labs/Diagnostics Findings: Na+/ K+:141/3.6  Glucose:135 BUN/Cr.:18/1.18 WBC:7.3 K/L Hgb/Hct:8.5/28.9  BNP:296.3   CTH>neg CTA Head and Neck>Right proximal M2 MCA occlusion with mid M2 MCA reconstitution   While the patient remained in the ED, their NIHSS worsened. Based on CTA findings, the Neurologist and Neuro Interventional Radiologist at  [facility] were contacted for evaluation for endovascular thrombectomy. Patient accepted for transfer. A STAT CTP obtained pending transfer.  Past Medical History  IDA, Anxiety and Depression, HTN, HLD, Hypothyroidism, OSA, T2DM, HFpEF, PVD, PVCs, TIA   Significant Hospital Events   9/15:Admit to ICU with Acute CVA s/p TNK 9/16: Transferred to [facility] for Endovascular Thrombectomy  Consults:  Neurology Neuro IR  Procedures:  9/16: Pending Endovascular Thrombectomy  Interim History / Subjective:    -see significant events  Micro Data:  None  Antimicrobials:  None  OBJECTIVE  Blood pressure (!) 112/40, pulse (!) 55, resp. rate 16, height 5' 4 (1.626 m), weight 103.4 kg, SpO2 100%.       No intake or output data in the 24 hours ending 08/09/24 2349 Filed Weights   08/09/24 2244  Weight: 103.4 kg   Physical Examination  GEN: Critically ill patient, WDWN in NAD HEENT: [place]/AT. PERRL, sclerae anicteric. HEART: Regular rate and rhythm. Grade 2 systolic murmur mitral area  LUNGS: CTAB, no increased WOB,  EXTREMITIES: Bilater Lower Extremities 2+pitting Edema, cap refill <3sec NEURO: .MENTAL STATUS: AAOx3 LANG/SPEECH: dysarthria with intact naming and repetition - follows commands appropriately CRANIAL NERVES: II: Pupils equal and reactive, no RAPD, Lt hemianopia III, IV, VI: EOM intact, no gaze preference or deviation, no nystagmus. V: normal VII: Lt facial weakness VIII: normal hearing to speech. Rest of cranial nerves are intact MOTOR: 1/5 in Lt upper extremity and 1/5 in Lt lower extremity 5/5 in Rt upper and lower extremity REFLEXES: 2/4 throughout, left extensor plantar SENSORY: decreased to touch and pain prick on left side COORD: Normal finger  to nose on right side - no dysmetria or tremors. Gait: deferred due to weakness PSYCH:  Mood and Affect: Mood normal.  ABDOMINAL: Soft: BS x 4, NTND SKIN: Intact, warm, no rashes lesion, or ulcer  Labs/imaging that I  havepersonally reviewed  (right click and Reselect all SmartList Selections daily)   CT ANGIO HEAD NECK W WO CM (CODE STROKE) Result Date: 08/09/2024 CLINICAL DATA:  Neuro deficit, acute, stroke suspected stroke eval, exclude lvo EXAM: CT ANGIOGRAPHY HEAD AND NECK WITH AND WITHOUT CONTRAST TECHNIQUE: Multidetector CT imaging of the head and neck was performed using the standard protocol during bolus administration of intravenous contrast. Multiplanar CT image reconstructions and MIPs were obtained to evaluate the vascular anatomy. Carotid stenosis measurements (when applicable) are obtained utilizing NASCET criteria, using the distal internal carotid diameter as the denominator. RADIATION DOSE REDUCTION: This exam was performed according to the departmental dose-optimization program which includes automated exposure control, adjustment of the mA and/or kV according to patient size and/or use of iterative reconstruction technique. CONTRAST:  75mL OMNIPAQUE  IOHEXOL  350 MG/ML SOLN COMPARISON:  Same day CT head. FINDINGS: CTA NECK FINDINGS Aortic arch: Great vessel origins are patent without significant stenosis. Aortic atherosclerosis. Right carotid system: Atherosclerosis at the carotid bifurcation without greater than 50% stenosis. Left carotid system: Atherosclerosis at the carotid bifurcation without greater than 50% stenosis. Vertebral arteries: Codominant. No evidence of dissection, stenosis (50% or greater), or occlusion. Skeleton: No evidence of acute abnormality on limited assessment. C5-C7 solid ACDF Other neck: No acute abnormality on limited assessment. Upper chest: Patchy consolidation and right greater than left upper lobes with interstitial lobular thickening Review of the MIP images confirms the above findings CTA HEAD FINDINGS Anterior circulation: Bilateral intracranial ICAs are patent without significant stenosis. The left MCA and bilateral ACAs are patent without proximal hemodynamically  significant stenosis. The right M1 MCA is patent. There is an occluded proximal right M2 MCA with mid M2 MCA reconstitution. Posterior circulation: Bilateral intradural vertebral arteries, basilar artery and bilateral posterior cerebral arteries are patent without proximal hemodynamically significant stenosis. Fetal type PCAs with small vertebrobasilar system, anatomic variant. Venous sinuses: As permitted by contrast timing, patent. Review of the MIP images confirms the above findings Other: Attempting to contact the provider at the time of dictation for call of report. IMPRESSION: 1. 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 iss 2. Patchy upper lobe consolidation concerning for aspiration and/or pneumonia. 3. Suspected mild interstitial pulmonary edema. Electronically Signed   By: Gilmore GORMAN Molt M.D.   On: 08/09/2024 23:41   CT HEAD CODE STROKE WO CONTRAST Result Date: 08/09/2024 CLINICAL DATA:  Code stroke.  Neuro deficit, acute, stroke suspected EXAM: CT HEAD WITHOUT CONTRAST TECHNIQUE: Contiguous axial images were obtained from the base of the skull through the vertex without intravenous contrast. RADIATION DOSE REDUCTION: This exam was performed according to the departmental dose-optimization program which includes automated exposure control, adjustment of the mA and/or kV according to patient size and/or use of iterative reconstruction technique. COMPARISON:  CT head 12/20/2014. FINDINGS: Brain: No evidence of acute infarction, hemorrhage, hydrocephalus, extra-axial collection or mass lesion/mass effect. Patchy white matter hypodensities are compatible with chronic microvascular disease. Vascular: No hyperdense vessel. Skull: No acute fracture. Sinuses/Orbits: Clear sinuses.  No acute orbital findings. Other: No mastoid effusions ASPECTS [facility] Stroke Program Early CT Score) Total score (0-10 with 10 being normal): 10. Other: Attempted to call report at the time of dictation.  IMPRESSION: 1. No evidence of acute  intracranial abnormality. ASPECTS is 10. 2. Chronic microvascular ischemic disease. Code stroke imaging results were communicated on 08/09/2024 at 10:40 pm to provider Dr. Dicky via telephone. Electronically Signed   By: Gilmore GORMAN Molt M.D.   On: 08/09/2024 22:41     Labs   CBC: Recent Labs  Lab 08/09/24 2333  WBC 7.3  NEUTROABS PENDING  HGB 8.5*  HCT 28.9*  MCV 90.3  PLT 159    Basic Metabolic Panel: No results for input(s): NA, K, CL, CO2, GLUCOSE, BUN, CREATININE, CALCIUM, MG, PHOS in the last 168 hours. GFR: CrCl cannot be calculated (Patient's most recent lab result is older than the maximum 21 days allowed.). Recent Labs  Lab 08/09/24 2333  WBC 7.3    Liver Function Tests: No results for input(s): AST, ALT, ALKPHOS, BILITOT, PROT, ALBUMIN  in the last 168 hours. No results for input(s): LIPASE, AMYLASE in the last 168 hours. No results for input(s): AMMONIA in the last 168 hours.  ABG No results found for: PHART, PCO2ART, PO2ART, HCO3, TCO2, ACIDBASEDEF, O2SAT   Coagulation Profile: No results for input(s): INR, PROTIME in the last 168 hours.  Cardiac Enzymes: No results for input(s): CKTOTAL, CKMB, CKMBINDEX, TROPONINI in the last 168 hours.  HbA1C: Hgb A1c MFr Bld  Date/Time Value Ref Range Status  09/24/2021 06:14 AM 6.8 (H) 4.8 - 5.6 % Final    Comment:    (NOTE) Pre diabetes:          5.7%-6.4%  Diabetes:              >6.4%  Glycemic control for   <7.0% adults with diabetes   06/05/2017 08:55 AM 7.9 (H) 4.8 - 5.6 % Final    Comment:    (NOTE)         Pre-diabetes: 5.7 - 6.4         Diabetes: >6.4         Glycemic control for adults with diabetes: <7.0     CBG: Recent Labs  Lab 08/09/24 2212  GLUCAP 135*    Review of Systems:   Unable to be obtained secondary to the patient's speech disturbance  Past Medical History  She,  has a past  medical history of Anemia, Arthritis, Asthma, Borderline glaucoma, Depression, Diabetes mellitus without complication (HCC), Edema, Elevated liver enzymes, Environmental allergies, Family history of adverse reaction to anesthesia, GERD (gastroesophageal reflux disease), Headache, Heart palpitations, History of hiatal hernia, Hyperlipidemia, Hypertension, Hypothyroidism, Lumbar stenosis, PONV (postoperative nausea and vomiting), Sleep apnea, TIA (transient ischemic attack), Vitamin B deficiency, Vitamin D  deficiency, Wears glasses, and Wears partial dentures.   Surgical History    Past Surgical History:  Procedure Laterality Date   ABDOMINAL HYSTERECTOMY       partial   anterior cevical fusion     APPENDECTOMY     BACK SURGERY     lumbar laminectomy   BREAST EXCISIONAL BIOPSY Left 2000   benign   CATARACT EXTRACTION W/ INTRAOCULAR LENS  IMPLANT, BILATERAL     CHOLECYSTECTOMY     COLONOSCOPY W/ BIOPSIES     MULTIPLE TOOTH EXTRACTIONS     TUBAL LIGATION       Social History   reports that she has never smoked. She has never used smokeless tobacco. She reports that she does not drink alcohol  and does not use drugs.   Family History   Her family history includes Breast cancer (age of onset: 70) in her sister; Diabetes in her mother; Glaucoma in her mother;  Heart disease in her father; Peripheral vascular disease in her father; Uterine cancer in her mother.   Allergies No Known Allergies   Home Medications  Prior to Admission medications   Medication Sig Start Date End Date Taking? Authorizing Provider  albuterol  (VENTOLIN  HFA) 108 (90 Base) MCG/ACT inhaler Inhale 2 puffs into the lungs every 6 (six) hours as needed for wheezing. 03/16/21 07/28/24  [provider]  aspirin  81 MG chewable tablet Chew 81 mg by mouth daily.    [provider]  celecoxib (CELEBREX) 200 MG capsule Take 200 mg by mouth 2 (two) times daily. 09/21/21   [provider]  Cyanocobalamin   (VITAMIN B-12) 2500 MCG SUBL Place 2,500 mcg under the tongue daily.    [provider]  cyclobenzaprine (FLEXERIL) 10 MG tablet Take 10 mg by mouth 2 (two) times daily as needed. 06/07/21   [provider]  diazepam  (VALIUM ) 5 MG tablet Take 1-2 tablets (5-10 mg total) by mouth every 6 (six) hours as needed for muscle spasms. Patient not taking: Reported on 07/28/2024 06/16/17   Louis Shove, MD  docusate sodium  (COLACE) 100 MG capsule Take 100 mg by mouth daily.    [provider]  DULoxetine  (CYMBALTA ) 30 MG capsule Take 60 mg by mouth daily. Patient not taking: Reported on 07/28/2024 04/10/17   [provider]  empagliflozin (JARDIANCE) 10 MG TABS tablet Take 10 mg by mouth daily. 11/11/23 11/10/24  [provider]  ergocalciferol  (VITAMIN D2) 50000 units capsule Take 50,000 Units by mouth every Sunday. Patient not taking: Reported on 07/28/2024    [provider]  fluticasone  (FLONASE ) 50 MCG/ACT nasal spray Place 1-2 sprays into both nostrils daily as needed. For nasal congestion. 10/22/15   [provider]  furosemide  (LASIX ) 40 MG tablet 1 tablet twice a day for 3 days, then 1 tablet once a day. 06/10/24   [provider]  gabapentin  (NEURONTIN ) 300 MG capsule Take 300-600 mg by mouth 3 (three) times daily. Take 300 mg by mouth twice daily and 600 mg at bedtime 08/10/21   [provider]  hydrochlorothiazide  (HYDRODIURIL ) 25 MG tablet Take 25 mg by mouth daily. 05/21/17   [provider]  HYDROcodone -acetaminophen  (NORCO) 10-325 MG tablet Take 1 tablet by mouth. 07/12/24   [provider]  isosorbide  mononitrate (IMDUR ) 60 MG 24 hr tablet Take 60 mg by mouth daily. 07/27/24   [provider]  levocetirizine (XYZAL ) 5 MG tablet Take 5 mg by mouth at bedtime. 03/27/17   [provider]  levothyroxine  (SYNTHROID , LEVOTHROID) 50 MCG tablet Take 75 mcg by mouth daily before breakfast. 04/04/17    [provider]  magnesium citrate solution Take 296 mLs by mouth once.    [provider]  metFORMIN  (GLUCOPHAGE ) 500 MG tablet Take 1,000 mg by mouth 2 (two) times daily. 05/12/17   [provider]  metoprolol  succinate (TOPROL -XL) 100 MG 24 hr tablet Take 100 mg by mouth 2 (two) times daily. 04/10/17   [provider]  nitrofurantoin, macrocrystal-monohydrate, (MACROBID) 100 MG capsule Take 100 mg by mouth 2 (two) times daily. Patient not taking: Reported on 07/28/2024 09/17/21   [provider]  Omega-3 Fatty Acids (OMEGA 3 PO) Take 450 mg by mouth daily. OMEGA XL 150 MG PER CAPSULE Patient not taking: Reported on 07/28/2024    [provider]  omeprazole (PRILOSEC) 40 MG capsule Take 40 mg by mouth daily before breakfast.    [provider]  ondansetron  (  ZOFRAN ) 4 MG tablet Take 4 mg by mouth every 8 (eight) hours as needed for nausea/vomiting. 08/14/21   [provider]  Polyethylene Glycol 400 0.25 % SOLN Place 1-2 drops into both eyes 3 (three) times daily as needed (for dry eyes.).    [provider]  Potassium 99 MG TABS Take 99 mg by mouth at bedtime. Patient not taking: Reported on 07/28/2024    [provider]  predniSONE  (DELTASONE ) 10 MG tablet Take 10-60 mg by mouth daily. Patient not taking: Reported on 07/28/2024 09/20/21   [provider]  simvastatin  (ZOCOR ) 40 MG tablet Take 40 mg by mouth at bedtime. 04/07/17   [provider]  Skin Protectants, Misc. (EUCERIN) cream Apply 1 application topically 5 (five) times daily as needed (for dry skin throughout day as needed for handwashing).    [provider]  traZODone  (DESYREL ) 100 MG tablet Take 100 mg by mouth at bedtime. 30 minutes before bedtime. 05/08/17   [provider]  triamcinolone  cream (KENALOG ) 0.1 % Apply 1 application topically 2 (two) times daily as needed (for psoriasis (typically once daily)).     [provider]  Scheduled Meds: Continuous Infusions: PRN Meds:.  Active Hospital Problem list   See systems below  Assessment & Plan:  #Acute RCJ:Emzdzwupwh w/Acute Left sided weakness, numbness, dysarthria, left facial droop Suspect RMCA Stroke iso right M2 occlusion CTA (9/15) shows Right proximal M2 MCA occlusion with mid M2 MCA reconstitution  Post IV TNK for Ischemic Stroke @2247  -VS with neuro checks/NIHSS  per TNK protocol for the first 24 hours  -No lines, catheters, antiplatelet or anticoagulants x24 hr after IV TNK unless required  -keep BP <185/100 with short-acting antihypertensives (Labetolol, Hydralazine  or Nicardipine Gtt as needed).  -MRI/MRA brain without contrast per stroke protocol -TTE to r/o cardioembolic source, assess EF  -check fasting Lipid Panel with Direct LDL in AM, HgA1c, TSH  -repeat head CT in 24 hours s/p IV Alteplase per protocol. -Place SCDs for DVT prevention. -Keep NPO  -PT consult, OT consult, Speech consult -Aspiration precautions, Bleeding precautions -Obtain STAT head CT for any new acute headache or new neurological deficits -Discussed finding with on-call neurologist and neurointerventional radiologist at [facility] who recommends transferring patient to [facility] for possible endovascular thrombectomy  #Chronic HFpEF #[id number] -Euvolemic on exam.  -BNP (9/16):296.3 -ECHO (12/24): LVEF 55%, grade III diastolic dysfunction, mild MR -Home GDMT of: Imdur , metoprolol , jardiance, Lasix  -Continue simvastatin  once able to tolerate p.o. -Strict I/Os -Obtain 2D echo  #Mild AKI likely iso IV contrast -Follow BMP+Mag -Ensure adequate renal perfusion -Avoid nephrotoxic  -Replace lytes  #Type II diabetes mellitus  -Check A1c -CBG's q4hrs  -SSI -Target CBG readings 140 to 180 -Follow hypo/hyperglycemic protocol  -Hold home metformin   #Hypothyroidism -Check TSH, Free T4  -Continue Synthroid   #Macrocytic Anemia~no signs of  active bleeding  Hx of IDA follows with oncology -Trend CBC  -Monitor for s/sx of bleeding  -Transfuse for hgb <7  #Anxiety and Depression -Hold home meds for now    Best practice:  Diet:  NPO Pain/Anxiety/Delirium protocol (if indicated): No VAP protocol (if indicated): Not indicated DVT prophylaxis: Contraindicated GI prophylaxis: PPI Glucose control:  SSI Yes Central venous access:  N/A Arterial line:  N/A Foley:  N/A Mobility:  bed rest  PT consulted: N/A Last date of multidisciplinary goals of care discussion []  Code Status:  full code Disposition: ICU   = Goals of Care = Code Status Order: FULL  Primary Emergency Contact: Rickenbach,Allen L, Home Phone: [phone]   Critical care time: 45 minutes        Almarie Nose DNP, CCRN, FNP-C, AGACNP-BC Acute Care & Family Nurse Practitioner [facility] PCCM on call pager [phone]

## 2024-08-10 ENCOUNTER — Ambulatory Visit

## 2024-08-10 ENCOUNTER — Inpatient Hospital Stay (HOSPITAL_COMMUNITY)

## 2024-08-10 ENCOUNTER — Telehealth (HOSPITAL_COMMUNITY): Payer: Self-pay | Admitting: Pharmacy Technician

## 2024-08-10 ENCOUNTER — Encounter: Payer: Self-pay | Admitting: Oncology

## 2024-08-10 ENCOUNTER — Other Ambulatory Visit (HOSPITAL_COMMUNITY): Payer: Self-pay

## 2024-08-10 ENCOUNTER — Emergency Department (HOSPITAL_COMMUNITY): Admitting: Critical Care Medicine

## 2024-08-10 ENCOUNTER — Inpatient Hospital Stay: Admit: 2024-08-10

## 2024-08-10 ENCOUNTER — Encounter (HOSPITAL_COMMUNITY)
Admission: EM | Disposition: A | Discharge status: Home Health | Payer: Self-pay | Source: Other Acute Inpatient Hospital | Attending: Neurology

## 2024-08-10 ENCOUNTER — Inpatient Hospital Stay

## 2024-08-10 ENCOUNTER — Encounter (HOSPITAL_COMMUNITY): Payer: Self-pay

## 2024-08-10 ENCOUNTER — Inpatient Hospital Stay (HOSPITAL_COMMUNITY)
Admission: EM | Admit: 2024-08-10 | Discharge: 2024-08-12 | DRG: 024 | Disposition: A | Discharge status: Home Health | Source: Other Acute Inpatient Hospital | Attending: Neurology | Admitting: Neurology

## 2024-08-10 DIAGNOSIS — I63511 Cerebral infarction due to unspecified occlusion or stenosis of right middle cerebral artery: Secondary | ICD-10-CM | POA: Diagnosis not present

## 2024-08-10 DIAGNOSIS — Z8249 Family history of ischemic heart disease and other diseases of the circulatory system: Secondary | ICD-10-CM

## 2024-08-10 DIAGNOSIS — Z79890 Hormone replacement therapy (postmenopausal): Secondary | ICD-10-CM | POA: Diagnosis not present

## 2024-08-10 DIAGNOSIS — Z6841 Body Mass Index (BMI) 40.0 and over, adult: Secondary | ICD-10-CM

## 2024-08-10 DIAGNOSIS — R414 Neurologic neglect syndrome: Secondary | ICD-10-CM | POA: Diagnosis not present

## 2024-08-10 DIAGNOSIS — E785 Hyperlipidemia, unspecified: Secondary | ICD-10-CM | POA: Diagnosis present

## 2024-08-10 DIAGNOSIS — Z7984 Long term (current) use of oral hypoglycemic drugs: Secondary | ICD-10-CM | POA: Diagnosis not present

## 2024-08-10 DIAGNOSIS — N179 Acute kidney failure, unspecified: Secondary | ICD-10-CM | POA: Diagnosis present

## 2024-08-10 DIAGNOSIS — I4892 Unspecified atrial flutter: Secondary | ICD-10-CM | POA: Diagnosis not present

## 2024-08-10 DIAGNOSIS — I110 Hypertensive heart disease with heart failure: Secondary | ICD-10-CM | POA: Diagnosis present

## 2024-08-10 DIAGNOSIS — E66813 Obesity, class 3: Secondary | ICD-10-CM | POA: Diagnosis not present

## 2024-08-10 DIAGNOSIS — J45909 Unspecified asthma, uncomplicated: Secondary | ICD-10-CM | POA: Diagnosis not present

## 2024-08-10 DIAGNOSIS — G8194 Hemiplegia, unspecified affecting left nondominant side: Secondary | ICD-10-CM | POA: Diagnosis not present

## 2024-08-10 DIAGNOSIS — Z961 Presence of intraocular lens: Secondary | ICD-10-CM | POA: Diagnosis present

## 2024-08-10 DIAGNOSIS — I63411 Cerebral infarction due to embolism of right middle cerebral artery: Secondary | ICD-10-CM | POA: Diagnosis not present

## 2024-08-10 DIAGNOSIS — R471 Dysarthria and anarthria: Secondary | ICD-10-CM | POA: Diagnosis present

## 2024-08-10 DIAGNOSIS — R29710 NIHSS score 10: Secondary | ICD-10-CM | POA: Diagnosis present

## 2024-08-10 DIAGNOSIS — Z9282 Status post administration of tPA (rtPA) in a different facility within the last 24 hours prior to admission to current facility: Secondary | ICD-10-CM

## 2024-08-10 DIAGNOSIS — Z90710 Acquired absence of both cervix and uterus: Secondary | ICD-10-CM

## 2024-08-10 DIAGNOSIS — E1151 Type 2 diabetes mellitus with diabetic peripheral angiopathy without gangrene: Secondary | ICD-10-CM | POA: Diagnosis present

## 2024-08-10 DIAGNOSIS — I6389 Other cerebral infarction: Secondary | ICD-10-CM

## 2024-08-10 DIAGNOSIS — K219 Gastro-esophageal reflux disease without esophagitis: Secondary | ICD-10-CM | POA: Diagnosis present

## 2024-08-10 DIAGNOSIS — Z79899 Other long term (current) drug therapy: Secondary | ICD-10-CM

## 2024-08-10 DIAGNOSIS — R918 Other nonspecific abnormal finding of lung field: Secondary | ICD-10-CM | POA: Diagnosis not present

## 2024-08-10 DIAGNOSIS — F32A Depression, unspecified: Secondary | ICD-10-CM | POA: Diagnosis present

## 2024-08-10 DIAGNOSIS — F419 Anxiety disorder, unspecified: Secondary | ICD-10-CM | POA: Diagnosis present

## 2024-08-10 DIAGNOSIS — I959 Hypotension, unspecified: Secondary | ICD-10-CM | POA: Diagnosis not present

## 2024-08-10 DIAGNOSIS — Z743 Need for continuous supervision: Secondary | ICD-10-CM | POA: Diagnosis not present

## 2024-08-10 DIAGNOSIS — I4891 Unspecified atrial fibrillation: Secondary | ICD-10-CM | POA: Diagnosis not present

## 2024-08-10 DIAGNOSIS — E039 Hypothyroidism, unspecified: Secondary | ICD-10-CM

## 2024-08-10 DIAGNOSIS — Z83511 Family history of glaucoma: Secondary | ICD-10-CM

## 2024-08-10 DIAGNOSIS — G4700 Insomnia, unspecified: Secondary | ICD-10-CM | POA: Diagnosis not present

## 2024-08-10 DIAGNOSIS — I6609 Occlusion and stenosis of unspecified middle cerebral artery: Secondary | ICD-10-CM | POA: Diagnosis not present

## 2024-08-10 DIAGNOSIS — I639 Cerebral infarction, unspecified: Secondary | ICD-10-CM | POA: Diagnosis not present

## 2024-08-10 DIAGNOSIS — D509 Iron deficiency anemia, unspecified: Secondary | ICD-10-CM | POA: Diagnosis not present

## 2024-08-10 DIAGNOSIS — R479 Unspecified speech disturbances: Secondary | ICD-10-CM | POA: Diagnosis not present

## 2024-08-10 DIAGNOSIS — E1165 Type 2 diabetes mellitus with hyperglycemia: Secondary | ICD-10-CM | POA: Diagnosis not present

## 2024-08-10 DIAGNOSIS — I5042 Chronic combined systolic (congestive) and diastolic (congestive) heart failure: Secondary | ICD-10-CM | POA: Diagnosis present

## 2024-08-10 DIAGNOSIS — Z833 Family history of diabetes mellitus: Secondary | ICD-10-CM

## 2024-08-10 DIAGNOSIS — I10 Essential (primary) hypertension: Secondary | ICD-10-CM | POA: Diagnosis not present

## 2024-08-10 DIAGNOSIS — R29810 Facial weakness: Secondary | ICD-10-CM | POA: Diagnosis present

## 2024-08-10 DIAGNOSIS — R9082 White matter disease, unspecified: Secondary | ICD-10-CM | POA: Diagnosis not present

## 2024-08-10 DIAGNOSIS — I517 Cardiomegaly: Secondary | ICD-10-CM | POA: Diagnosis not present

## 2024-08-10 DIAGNOSIS — Z7982 Long term (current) use of aspirin: Secondary | ICD-10-CM

## 2024-08-10 DIAGNOSIS — G319 Degenerative disease of nervous system, unspecified: Secondary | ICD-10-CM | POA: Diagnosis not present

## 2024-08-10 DIAGNOSIS — J984 Other disorders of lung: Secondary | ICD-10-CM | POA: Diagnosis not present

## 2024-08-10 DIAGNOSIS — E559 Vitamin D deficiency, unspecified: Secondary | ICD-10-CM | POA: Diagnosis present

## 2024-08-10 DIAGNOSIS — Z7901 Long term (current) use of anticoagulants: Secondary | ICD-10-CM

## 2024-08-10 DIAGNOSIS — D508 Other iron deficiency anemias: Secondary | ICD-10-CM

## 2024-08-10 DIAGNOSIS — Z803 Family history of malignant neoplasm of breast: Secondary | ICD-10-CM

## 2024-08-10 DIAGNOSIS — R29703 NIHSS score 3: Secondary | ICD-10-CM | POA: Diagnosis not present

## 2024-08-10 DIAGNOSIS — Z8049 Family history of malignant neoplasm of other genital organs: Secondary | ICD-10-CM

## 2024-08-10 DIAGNOSIS — I6340 Cerebral infarction due to embolism of unspecified cerebral artery: Secondary | ICD-10-CM | POA: Diagnosis not present

## 2024-08-10 DIAGNOSIS — Z23 Encounter for immunization: Secondary | ICD-10-CM | POA: Diagnosis not present

## 2024-08-10 DIAGNOSIS — I5030 Unspecified diastolic (congestive) heart failure: Secondary | ICD-10-CM | POA: Diagnosis not present

## 2024-08-10 DIAGNOSIS — G4733 Obstructive sleep apnea (adult) (pediatric): Secondary | ICD-10-CM | POA: Diagnosis present

## 2024-08-10 DIAGNOSIS — D649 Anemia, unspecified: Secondary | ICD-10-CM | POA: Diagnosis not present

## 2024-08-10 HISTORY — PX: RADIOLOGY WITH ANESTHESIA: SHX6223

## 2024-08-10 HISTORY — PX: IR PERCUTANEOUS ART THROMBECTOMY/INFUSION INTRACRANIAL INC DIAG ANGIO: IMG6087

## 2024-08-10 HISTORY — PX: IR CT HEAD LTD: IMG2386

## 2024-08-10 HISTORY — PX: IR US GUIDE VASC ACCESS RIGHT: IMG2390

## 2024-08-10 LAB — T4, FREE: Free T4: 1.15 ng/dL — ABNORMAL HIGH (ref 0.61–1.12)

## 2024-08-10 LAB — TSH: TSH: 2.832 u[IU]/mL (ref 0.350–4.500)

## 2024-08-10 LAB — CBC WITH DIFFERENTIAL/PLATELET
Basophils Relative: 0.1 % (ref 0.0–0.1)
Eosinophils Absolute: 1 % (ref 0.0–0.5)
Eosinophils Relative: 0.3 % (ref 0.0–0.5)
HCT: 28.9 % — ABNORMAL LOW (ref 36.0–46.0)
Hemoglobin: 8.5 g/dL — ABNORMAL LOW (ref 12.0–15.0)
Lymphocytes Relative: 19 %
Lymphs Abs: 1.4 % (ref 0.7–4.0)
MCH: 26.6 pg (ref 26.0–34.0)
MCHC: 29.4 g/dL — ABNORMAL LOW (ref 30.0–36.0)
MCV: 90.3 fL (ref 80.0–100.0)
Monocytes Absolute: 5 % (ref 0.1–1.0)
Monocytes Relative: 0.5 % (ref 0.1–1.0)
Monocytes Relative: 7 % (ref 0.7–4.0)
Neutro Abs: 4.9 K/uL (ref 1.7–7.7)
Neutrophils Relative %: 68 %
Other: 0.03 % (ref 0.00–0.07)
Platelets: 159 K/uL (ref 150–400)
RBC: 3.2 MIL/uL — ABNORMAL LOW (ref 3.87–5.11)
RDW: 23.9 % — ABNORMAL HIGH (ref 11.5–15.5)
Smear Review: 0 %
Smear Review: NORMAL
WBC: 7.3 K/uL (ref 4.0–10.5)
nRBC: 0 % (ref 0.0–0.2)

## 2024-08-10 LAB — GLUCOSE, CAPILLARY
Glucose-Capillary: 106 mg/dL — ABNORMAL HIGH (ref 70–99)
Glucose-Capillary: 126 mg/dL — ABNORMAL HIGH (ref 70–99)
Glucose-Capillary: 136 mg/dL — ABNORMAL HIGH (ref 70–99)
Glucose-Capillary: 153 mg/dL — ABNORMAL HIGH (ref 70–99)
Glucose-Capillary: 159 mg/dL — ABNORMAL HIGH (ref 70–99)
Glucose-Capillary: 192 mg/dL — ABNORMAL HIGH (ref 70–99)

## 2024-08-10 LAB — ECHOCARDIOGRAM COMPLETE
Area-P 1/2: 3.65 cm2
Calc EF: 72.3 %
S' Lateral: 2.6 cm
Single Plane A2C EF: 72 %
Single Plane A4C EF: 72.8 %
Weight: 3880.1 [oz_av]

## 2024-08-10 LAB — ETHANOL: Alcohol, Ethyl (B): <15 mg/dL (ref <15)

## 2024-08-10 LAB — LIPID PANEL
Cholesterol: 86 mg/dL (ref 0–200)
Cholesterol: 90 mg/dL (ref 0–200)
HDL: 32 mg/dL — ABNORMAL LOW (ref >40)
HDL: 33 mg/dL — ABNORMAL LOW (ref >40)
LDL Cholesterol: 20 mg/dL (ref 0–99)
LDL Cholesterol: 24 mg/dL (ref 0–99)
Total CHOL/HDL Ratio: 2.6 ratio
Total CHOL/HDL Ratio: 2.8 ratio
Triglycerides: 165 mg/dL — ABNORMAL HIGH (ref <150)
Triglycerides: 171 mg/dL — ABNORMAL HIGH (ref <150)
VLDL: 33 mg/dL (ref 0–40)
VLDL: 34 mg/dL (ref 0–40)

## 2024-08-10 LAB — STREP PNEUMONIAE URINARY ANTIGEN: Strep Pneumo Urinary Antigen: NEGATIVE

## 2024-08-10 LAB — HEMOGLOBIN A1C
Hgb A1c MFr Bld: 6 % — ABNORMAL HIGH (ref 4.8–5.6)
Mean Plasma Glucose: 125.5 mg/dL

## 2024-08-10 LAB — BRAIN NATRIURETIC PEPTIDE: B Natriuretic Peptide: 296.3 pg/mL — ABNORMAL HIGH (ref 0.0–100.0)

## 2024-08-10 LAB — PROCALCITONIN: Procalcitonin: <0.1 ng/mL

## 2024-08-10 LAB — MRSA NEXT GEN BY PCR, NASAL: MRSA by PCR Next Gen: NOT DETECTED

## 2024-08-10 SURGERY — RADIOLOGY WITH ANESTHESIA
Anesthesia: General

## 2024-08-10 MED ORDER — ROCURONIUM BROMIDE 10 MG/ML (PF) SYRINGE
PREFILLED_SYRINGE | INTRAVENOUS | Status: AC
  Filled 2024-08-10: qty 10

## 2024-08-10 MED ORDER — PANTOPRAZOLE SODIUM 40 MG IV SOLR: 40.0000 mg | Freq: Every day | INTRAVENOUS | Status: DC

## 2024-08-10 MED ORDER — PROPOFOL 10 MG/ML IV BOLUS
INTRAVENOUS | Status: DC | PRN
Start: 2024-08-10 — End: 2024-08-10
  Administered 2024-08-10: 120 mg via INTRAVENOUS

## 2024-08-10 MED ORDER — PHENYLEPHRINE HCL-NACL 20-0.9 MG/250ML-% IV SOLN
INTRAVENOUS | Status: DC | PRN
  Administered 2024-08-10: 20 ug/min via INTRAVENOUS

## 2024-08-10 MED ORDER — SODIUM CHLORIDE 0.9 % IV BOLUS
250.0000 mL | INTRAVENOUS | Status: DC | PRN
  Administered 2024-08-10: 250 mL via INTRAVENOUS

## 2024-08-10 MED ORDER — GABAPENTIN 300 MG PO CAPS
300.0000 mg | ORAL_CAPSULE | Freq: Three times a day (TID) | ORAL | Status: DC
  Administered 2024-08-10 – 2024-08-12 (×7): 300 mg via ORAL
  Filled 2024-08-10 (×7): qty 1

## 2024-08-10 MED ORDER — ONDANSETRON HCL 4 MG/2ML IJ SOLN
INTRAMUSCULAR | Status: DC | PRN
  Administered 2024-08-10: 4 mg via INTRAVENOUS

## 2024-08-10 MED ORDER — ALBUTEROL SULFATE (2.5 MG/3ML) 0.083% IN NEBU: 2.5000 mg | INHALATION_SOLUTION | Freq: Four times a day (QID) | RESPIRATORY_TRACT | Status: DC | PRN

## 2024-08-10 MED ORDER — POTASSIUM CHLORIDE CRYS ER 20 MEQ PO TBCR
20.0000 meq | EXTENDED_RELEASE_TABLET | Freq: Once | ORAL | Status: AC
Start: 2024-08-10 — End: 2024-08-10
  Administered 2024-08-10: 20 meq via ORAL
  Filled 2024-08-10: qty 1

## 2024-08-10 MED ORDER — CHLORHEXIDINE GLUCONATE CLOTH 2 % EX PADS
6.0000 | MEDICATED_PAD | Freq: Every day | CUTANEOUS | Status: DC
  Administered 2024-08-10 – 2024-08-11 (×2): 6 via TOPICAL

## 2024-08-10 MED ORDER — LACTATED RINGERS IV SOLN: INTRAVENOUS | Status: DC | PRN

## 2024-08-10 MED ORDER — PHENYLEPHRINE HCL-NACL 20-0.9 MG/250ML-% IV SOLN
0.0000 ug/min | INTRAVENOUS | Status: DC
  Administered 2024-08-10: 30 ug/min via INTRAVENOUS

## 2024-08-10 MED ORDER — ACETAMINOPHEN 325 MG PO TABS: 650.0000 mg | ORAL_TABLET | ORAL | Status: DC | PRN

## 2024-08-10 MED ORDER — INFLUENZA VAC SPLIT HIGH-DOSE 0.5 ML IM SUSY
0.5000 mL | PREFILLED_SYRINGE | INTRAMUSCULAR | Status: AC
  Administered 2024-08-12: 0.5 mL via INTRAMUSCULAR
  Filled 2024-08-10: qty 0.5

## 2024-08-10 MED ORDER — HYDROCODONE-ACETAMINOPHEN 10-325 MG PO TABS
1.0000 | ORAL_TABLET | Freq: Four times a day (QID) | ORAL | Status: DC | PRN
  Administered 2024-08-10 – 2024-08-12 (×6): 1 via ORAL
  Filled 2024-08-10 (×6): qty 1

## 2024-08-10 MED ORDER — EPHEDRINE SULFATE (PRESSORS) 50 MG/ML IJ SOLN
INTRAMUSCULAR | Status: DC | PRN
  Administered 2024-08-10 (×3): 5 mg via INTRAVENOUS

## 2024-08-10 MED ORDER — SUGAMMADEX SODIUM 200 MG/2ML IV SOLN
INTRAVENOUS | Status: DC | PRN
  Administered 2024-08-10: 200 mg via INTRAVENOUS

## 2024-08-10 MED ORDER — IOHEXOL 350 MG/ML SOLN
75.0000 mL | Freq: Once | INTRAVENOUS | Status: AC | PRN
  Administered 2024-08-10: 40 mL via INTRAVENOUS

## 2024-08-10 MED ORDER — ACETAMINOPHEN 160 MG/5ML PO SOLN: 650.0000 mg | ORAL | Status: DC | PRN

## 2024-08-10 MED ORDER — CEFTRIAXONE SODIUM 1 G IJ SOLR
1.0000 g | INTRAMUSCULAR | Status: DC
  Administered 2024-08-10 – 2024-08-11 (×2): 1 g via INTRAVENOUS
  Filled 2024-08-10 (×2): qty 10

## 2024-08-10 MED ORDER — ETOMIDATE 2 MG/ML IV SOLN
INTRAVENOUS | Status: AC
  Filled 2024-08-10: qty 20

## 2024-08-10 MED ORDER — NOREPINEPHRINE 4 MG/250ML-% IV SOLN
INTRAVENOUS | Status: AC
  Filled 2024-08-10: qty 250

## 2024-08-10 MED ORDER — SODIUM CHLORIDE 0.9 % IV SOLN: INTRAVENOUS | Status: DC

## 2024-08-10 MED ORDER — ROCURONIUM BROMIDE 100 MG/10ML IV SOLN
INTRAVENOUS | Status: DC | PRN
  Administered 2024-08-10: 40 mg via INTRAVENOUS

## 2024-08-10 MED ORDER — SUCCINYLCHOLINE CHLORIDE 200 MG/10ML IV SOSY
PREFILLED_SYRINGE | INTRAVENOUS | Status: DC | PRN
  Administered 2024-08-10: 160 mg via INTRAVENOUS

## 2024-08-10 MED ORDER — METOPROLOL SUCCINATE ER 50 MG PO TB24
50.0000 mg | ORAL_TABLET | Freq: Two times a day (BID) | ORAL | Status: DC
Start: 2024-08-10 — End: 2024-08-12
  Administered 2024-08-10 – 2024-08-12 (×4): 50 mg via ORAL
  Filled 2024-08-10 (×4): qty 1

## 2024-08-10 MED ORDER — IOHEXOL 300 MG/ML  SOLN
150.0000 mL | Freq: Once | INTRAMUSCULAR | Status: AC | PRN
  Administered 2024-08-10: 50 mL via INTRA_ARTERIAL

## 2024-08-10 MED ORDER — SIMVASTATIN 20 MG PO TABS
40.0000 mg | ORAL_TABLET | Freq: Every day | ORAL | Status: DC
Start: 2024-08-10 — End: 2024-08-12
  Administered 2024-08-10 – 2024-08-11 (×2): 40 mg via ORAL
  Filled 2024-08-10 (×2): qty 2

## 2024-08-10 MED ORDER — PERFLUTREN LIPID MICROSPHERE
1.0000 mL | INTRAVENOUS | Status: AC | PRN
  Administered 2024-08-10: 2 mL via INTRAVENOUS

## 2024-08-10 MED ORDER — HYDRALAZINE HCL 20 MG/ML IJ SOLN: 10.0000 mg | Freq: Four times a day (QID) | INTRAMUSCULAR | Status: DC | PRN

## 2024-08-10 MED ORDER — GLYCOPYRROLATE PF 0.2 MG/ML IJ SOSY
PREFILLED_SYRINGE | INTRAMUSCULAR | Status: DC | PRN
  Administered 2024-08-10: .2 mg via INTRAVENOUS

## 2024-08-10 MED ORDER — TRAZODONE HCL 50 MG PO TABS
100.0000 mg | ORAL_TABLET | Freq: Every evening | ORAL | Status: DC | PRN
  Administered 2024-08-10 – 2024-08-11 (×2): 100 mg via ORAL
  Filled 2024-08-10 (×2): qty 2

## 2024-08-10 MED ORDER — LABETALOL HCL 5 MG/ML IV SOLN: 10.0000 mg | INTRAVENOUS | Status: DC | PRN

## 2024-08-10 MED ORDER — STROKE: EARLY STAGES OF RECOVERY BOOK
Freq: Once | Status: AC
  Administered 2024-08-10: 1
  Filled 2024-08-10: qty 1

## 2024-08-10 MED ORDER — CHLORHEXIDINE GLUCONATE CLOTH 2 % EX PADS: 6.0000 | MEDICATED_PAD | Freq: Every day | CUTANEOUS | Status: DC

## 2024-08-10 MED ORDER — PANTOPRAZOLE SODIUM 40 MG PO TBEC
40.0000 mg | DELAYED_RELEASE_TABLET | Freq: Every day | ORAL | Status: DC
  Administered 2024-08-10 – 2024-08-12 (×3): 40 mg via ORAL
  Filled 2024-08-10 (×3): qty 1

## 2024-08-10 MED ORDER — ACETAMINOPHEN 650 MG RE SUPP: 650.0000 mg | RECTAL | Status: DC | PRN

## 2024-08-10 MED ORDER — LEVOTHYROXINE SODIUM 75 MCG PO TABS
75.0000 ug | ORAL_TABLET | Freq: Every day | ORAL | Status: DC
  Administered 2024-08-11 – 2024-08-12 (×2): 75 ug via ORAL
  Filled 2024-08-10 (×2): qty 1

## 2024-08-10 MED ORDER — INSULIN ASPART 100 UNIT/ML IJ SOLN
0.0000 [IU] | Freq: Three times a day (TID) | INTRAMUSCULAR | Status: DC
  Administered 2024-08-10: 3 [IU] via SUBCUTANEOUS
  Administered 2024-08-10 – 2024-08-11 (×3): 4 [IU] via SUBCUTANEOUS
  Administered 2024-08-11: 3 [IU] via SUBCUTANEOUS
  Administered 2024-08-11: 7 [IU] via SUBCUTANEOUS
  Administered 2024-08-12 (×2): 4 [IU] via SUBCUTANEOUS

## 2024-08-10 MED ORDER — LIDOCAINE 2% (20 MG/ML) 5 ML SYRINGE
INTRAMUSCULAR | Status: DC | PRN
  Administered 2024-08-10: 80 mg via INTRAVENOUS

## 2024-08-10 MED ORDER — SENNOSIDES-DOCUSATE SODIUM 8.6-50 MG PO TABS: 1.0000 | ORAL_TABLET | Freq: Every evening | ORAL | Status: DC | PRN

## 2024-08-10 MED ORDER — ISOSORBIDE MONONITRATE ER 30 MG PO TB24
60.0000 mg | ORAL_TABLET | Freq: Every day | ORAL | Status: DC
  Administered 2024-08-10 – 2024-08-11 (×2): 60 mg via ORAL
  Filled 2024-08-10 (×2): qty 2

## 2024-08-10 MED ORDER — DEXAMETHASONE SODIUM PHOSPHATE 10 MG/ML IJ SOLN
INTRAMUSCULAR | Status: DC | PRN
  Administered 2024-08-10: 4 mg via INTRAVENOUS

## 2024-08-10 MED ORDER — CLEVIDIPINE BUTYRATE 0.5 MG/ML IV EMUL: 0.0000 mg/h | INTRAVENOUS | Status: DC

## 2024-08-10 MED ORDER — LATANOPROST 0.005 % OP SOLN
1.0000 [drp] | Freq: Every day | OPHTHALMIC | Status: DC
  Administered 2024-08-10 – 2024-08-11 (×2): 1 [drp] via OPHTHALMIC
  Filled 2024-08-10: qty 2.5

## 2024-08-10 NOTE — Progress Notes (Signed)
 eLink Physician-Brief Progress Note Patient Name: Margaret Frye DOB: [birth year] MRN: [id number]   Date of Service  08/10/2024  HPI/Events of Note  78yoF hx of DM, CVA, CHF, hypothyroidism present with sudden onset left sided weakness and numbness found to have an acute stroke in the right M2 distribution, TNK was administered, and patient was considered for transfer to [facility] for thrombectomy.  In the interval, she was admitted to the ICU for TNK management.  Patient is bradycardic but otherwise normal vitals.  Results with some mild electrolyte disturbances, elevated creatinine, normocytic anemia.  CT perfusion consistent with penumbra in the right M2 distribution.  eICU Interventions  Post TNK precautions, frequent neurological checks  Flat head of bed until IR thrombectomy  Transferring to [facility] for thrombectomy.  Standard stroke workup with MRI, echo, risk stratification labs  DVT prophylaxis with SCDs GI prophylaxis not indicated, consider discontinuation of pantoprazole      Intervention Category Evaluation Type: New Patient Evaluation  Alianys Chacko 08/10/2024, 12:27 AM

## 2024-08-10 NOTE — Progress Notes (Signed)
 Patient refusing to keep leg straight and lay flat. Continuously sitting up, trying to get up, and bending leg. Educated multiple times by several nurses.

## 2024-08-10 NOTE — Evaluation (Signed)
 Physical Therapy Evaluation Patient Details Name: Margaret Frye MRN: [id number] DOB: December 24, 1945 Today's Date: 08/10/2024  History of Present Illness  78 yo female admits to [facility] on 08/10/24 from [place] for sudden onset of L sided weakness and numbness. TNK administered 2247 on 9/15. 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 S/p R M2 thrombectomy 9/16. PMH: DM, CA, CHF, hypothyroidism  Clinical Impression  Pt presents with decreased strength, mobility, and mild inattention to her left side. Pt to benefit from acute PT to address deficits. Pt states that she feels weaker compared to baseline but is somewhat close. Pt requires minA for bed mobility and transfers at this time. Pt ambulated room distance using RW with no overt LOB but demonstrates L inattention as her hand slips forward off the RW and requires cues to correct. Educated family and pt to attend more to her left side to address inattention. Pt to benefit from further PT to address deficits and return to baseline. PT to progress mobility as tolerated, and will continue to follow acutely.       If plan is discharge home, recommend the following: A little help with walking and/or transfers;A little help with bathing/dressing/bathroom;Assist for transportation   Can travel by private vehicle        Equipment Recommendations None recommended by PT  Recommendations for Other Services       Functional Status Assessment Patient has had a recent decline in their functional status and demonstrates the ability to make significant improvements in function in a reasonable and predictable amount of time.     Precautions / Restrictions Precautions Precautions: Fall Restrictions Weight Bearing Restrictions Per Provider Order: No      Mobility  Bed Mobility Overal bed mobility: Needs Assistance Bed Mobility: Supine to Sit     Supine to sit: Min assist, HOB elevated     General bed mobility comments:  Assist for trunk elevation and increased time to sit R EOB    Transfers Overall transfer level: Needs assistance Equipment used: Rolling walker (2 wheels) Transfers: Sit to/from Stand Sit to Stand: Min assist, Contact guard assist           General transfer comment: Sit to standx2 (minA from EOB, CGA from toilet). Assist to rise and steady    Ambulation/Gait Ambulation/Gait assistance: Min assist Gait Distance (Feet): 25 Feet   Gait Pattern/deviations: Step-through pattern, Decreased stride length, Trunk flexed Gait velocity: Decreased     General Gait Details: Cues for hand placement on RW. Slight inattention on L side.  Stairs            Wheelchair Mobility     Tilt Bed    Modified Rankin (Stroke Patients Only) Modified Rankin (Stroke Patients Only) Pre-Morbid Rankin Score: No symptoms Modified Rankin: Slight disability     Balance Overall balance assessment: Needs assistance Sitting-balance support: Bilateral upper extremity supported, Feet supported Sitting balance-Leahy Scale: Good     Standing balance support: Bilateral upper extremity supported, During functional activity, Reliant on assistive device for balance Standing balance-Leahy Scale: Fair Standing balance comment: Benefits from using RW                             Pertinent Vitals/Pain Pain Assessment Pain Assessment: Faces Faces Pain Scale: No hurt    Home Living Family/patient expects to be discharged to:: Private residence Living Arrangements: Spouse/significant other Available Help at Discharge: Family Type  of Home: House Home Access: Stairs to enter Entrance Stairs-Rails: Can reach both Entrance Stairs-Number of Steps: 3   Home Layout: Two level;Able to live on main level with bedroom/bathroom Home Equipment: Rolling Walker (2 wheels);Grab bars - tub/shower;Shower seat      Prior Function Prior Level of Function : Independent/Modified Independent;Driving              Mobility Comments: Uses walker       Extremity/Trunk Assessment   Upper Extremity Assessment Upper Extremity Assessment: Defer to OT evaluation    Lower Extremity Assessment Lower Extremity Assessment: Generalized weakness (Grossly 4/5 BLE)    Cervical / Trunk Assessment Cervical / Trunk Assessment: Normal  Communication   Communication Communication: No apparent difficulties    Cognition Arousal: Alert Behavior During Therapy: Flat affect   PT - Cognitive impairments: No apparent impairments                         Following commands: Intact       Cueing Cueing Techniques: Verbal cues, Tactile cues     General Comments General comments (skin integrity, edema, etc.): VSS on RA    Exercises     Assessment/Plan    PT Assessment Patient needs continued PT services  PT Problem List Decreased balance;Decreased mobility;Decreased activity tolerance;Decreased strength       PT Treatment Interventions Gait training;Stair training;Functional mobility training;Therapeutic activities;Therapeutic exercise;Neuromuscular re-education    PT Goals (Current goals can be found in the Care Plan section)  Acute Rehab PT Goals Patient Stated Goal: Return home and indepedence PT Goal Formulation: With patient Time For Goal Achievement: 08/24/24 Potential to Achieve Goals: Good    Frequency Min 2X/week     Co-evaluation               AM-PAC PT 6 Clicks Mobility  Outcome Measure Help needed turning from your back to your side while in a flat bed without using bedrails?: A Little Help needed moving from lying on your back to sitting on the side of a flat bed without using bedrails?: A Little Help needed moving to and from a bed to a chair (including a wheelchair)?: A Little Help needed standing up from a chair using your arms (e.g., wheelchair or bedside chair)?: A Little Help needed to walk in hospital room?: A Little Help needed climbing 3-5  steps with a railing? : A Lot 6 Click Score: 17    End of Session Equipment Utilized During Treatment: Gait belt Activity Tolerance: Patient tolerated treatment well Patient left: in chair;with call bell/phone within reach;with chair alarm set;with family/visitor present Nurse Communication: Mobility status PT Visit Diagnosis: Other abnormalities of gait and mobility (R26.89);Muscle weakness (generalized) (M62.81)    Time: 8882-8861 PT Time Calculation (min) (ACUTE ONLY): 21 min   Charges:   PT Evaluation $PT Eval Low Complexity: 1 Low   PT General Charges $$ ACUTE PT VISIT: 1 Visit         Quintin Campi, SPT  Acute Rehab  [phone]   Quintin Campi 08/10/2024, 1:03 PM

## 2024-08-10 NOTE — Progress Notes (Addendum)
 0021 Patient arrived to ICU   0030 [facility] contacted for Transport to [facility] cone for emergent interventional radiology. Informed [facility] would not be available for 2 hours.  0040 [facility] contacted awaiting to hear back for possible transport   0047 [facility] contacted no transport available.   9948 [facility] contacted for how to set up landing a helicopter if [facility] is able to   0056 [facility] shift commander Chyr[facility] contacted would [facility] would need to contact the airport to land and then [facility] would be able to assist transfer from air port to [facility] [facility].   0101 [facility] contacted would have to check flight pattern weather conditions and then call the airport.  0106 [facility] update unit that they have a bus that is 40 mins out.   0150 [facility] took over care of patient

## 2024-08-10 NOTE — Transfer of Care (Signed)
 Immediate Anesthesia Transfer of Care Note  Patient: Margaret Frye  Procedure(s) Performed: RADIOLOGY WITH ANESTHESIA IR PERCUTANEOUS ART THROMBECTOMY/INFUSION INTRACRANIAL INC DIAG ANGIO IR US  GUIDE VASC ACCESS RIGHT IR CT HEAD LTD  Patient Location: PACU  Anesthesia Type:General  Level of Consciousness: awake, alert , and oriented  Airway & Oxygen Therapy: Patient Spontanous Breathing and Patient connected to nasal cannula oxygen  Post-op Assessment: Report given to RN, Post -op Vital signs reviewed and stable, and Patient able to stick tongue midline  Post vital signs: Reviewed and stable  Last Vitals:  Vitals Value Taken Time  BP 113/49 08/10/24 03:27  Temp    Pulse 60 08/10/24 03:31  Resp 10 08/10/24 03:31  SpO2 94 % 08/10/24 03:31  Vitals shown include unfiled device data.  Last Pain: There were no vitals filed for this visit.       Complications: No notable events documented.

## 2024-08-10 NOTE — Procedures (Signed)
  NEUROSURGERY BRIEF THROMBECTOMY NOTE   PREOP DX: Acute ischemic stroke  POSTOP DX: Same  PROCEDURE: Right M2 thrombectomy  SURGEON: Werner Labella   ANESTHESIA: GETA  EBL: Minimal  Number of Passes: Single  Technique: ASPIRATION  Final TICI score: 3  Post OP blood pressure goal: SBP<160  Arterial Angioplasty or Stent: No   Anti-Platelet Therapy: No   COMPLICATIONS: No   CONDITION: Stable to recovery  FINDINGS (Full report in [facility]): 1. Successful right MCA thrombectomy.   Margaret Frye  @today @ 3:08 AM

## 2024-08-10 NOTE — Anesthesia Procedure Notes (Signed)
 Procedure Name: Intubation Date/Time: 08/10/2024 2:36 AM  Performed by: Jama Powell NOVAK, CRNAPre-anesthesia Checklist: Patient identified, Timeout performed, Emergency Drugs available, Suction available and Patient being monitored Patient Re-evaluated:Patient Re-evaluated prior to induction Oxygen Delivery Method: Circle system utilized Preoxygenation: Pre-oxygenation with 100% oxygen Induction Type: IV induction, Rapid sequence and Cricoid Pressure applied Laryngoscope Size: Mac and 3 Grade View: Grade I Tube type: Oral Tube size: 7.0 mm Number of attempts: 1 Airway Equipment and Method: Stylet Placement Confirmation: breath sounds checked- equal and bilateral, positive ETCO2, CO2 detector and ETT inserted through vocal cords under direct vision Secured at: 22 cm Tube secured with: Tape Dental Injury: Teeth and Oropharynx as per pre-operative assessment

## 2024-08-10 NOTE — Anesthesia Postprocedure Evaluation (Signed)
 Anesthesia Post Note  Patient: Margaret Frye  Procedure(s) Performed: RADIOLOGY WITH ANESTHESIA IR PERCUTANEOUS ART THROMBECTOMY/INFUSION INTRACRANIAL INC DIAG ANGIO IR US  GUIDE VASC ACCESS RIGHT IR CT HEAD LTD     Patient location during evaluation: PACU Anesthesia Type: General Level of consciousness: patient cooperative and awake Pain management: pain level controlled Vital Signs Assessment: post-procedure vital signs reviewed and stable Respiratory status: spontaneous breathing, nonlabored ventilation and respiratory function stable Cardiovascular status: blood pressure returned to baseline and stable Postop Assessment: no apparent nausea or vomiting Anesthetic complications: no   No notable events documented.                Ladell Lea

## 2024-08-10 NOTE — ED Provider Notes (Signed)
 Clinical Course as of 08/10/24 0111  Mon Aug 09, 2024  2305 Received signout. Acute stroke. Received TNK. Going to ICU. [ ]  CTA [HD]  2352 Patient seen by ICU but received a call from radiologist that patient has a proximal M2 occlusion and will need to be transferred for embolectomy.  ICU consulted notified they will notify neurology with the plan for transfer at this time.... [HD]  2358 Talked with neurologist on-call [place] to get the CT perfusion.  Talked with CT scanner will go to for perfusion only now.  Nurse updated [HD]  Tue Aug 10, 2024  0013 ICU consult was talked to [facility] health neurologist for transfer [HD]  0017 Patient off the floor in the ICU, per nursing, after CT perfusion, patients BP dropped. ICU consultant notified.  [HD]     Clinical Course User Index [HD] Nicholaus Rolland BRAVO, MD [MQ] Dicky Anes, MD   .Critical Care  Performed by: Nicholaus Rolland BRAVO, MD Authorized by: Nicholaus Rolland BRAVO, MD   Critical care provider statement:    Critical care time (minutes):  30   Critical care was necessary to treat or prevent imminent or life-threatening deterioration of the following conditions:  CNS failure or compromise   Critical care was time spent personally by me on the following activities:  Development of treatment plan with patient or surrogate, discussions with consultants, evaluation of patient's response to treatment, examination of patient, ordering and review of laboratory studies, ordering and review of radiographic studies, ordering and performing treatments and interventions, pulse oximetry, re-evaluation of patient's condition and review of old charts   Care discussed with: admitting provider       Nicholaus Rolland BRAVO, MD 08/10/24 [phone]

## 2024-08-10 NOTE — Evaluation (Signed)
 Occupational Therapy Evaluation Patient Details Name: Margaret Frye MRN: [id number] DOB: 07/07/1946 Today's Date: 08/10/2024   History of Present Illness   78 yo female admits to [facility] on 08/10/24 from [place] for sudden onset of L sided weakness and numbness. TNK administered 2247 on 9/15. 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 S/p R M2 thrombectomy 9/16. PMH: DM, CA, CHF, hypothyroidism     Clinical Impressions Pt ind at baseline with ADLs and uses RW/cane for mobility. Pt lives with spouse. Pt currently with mild L sided deficits and ?visual deficits. Pt noted to have decr coordination and sensation to LUE. Pt needs up to min A for ADLs, min A for bed mobility and CGA for transfers with RW. Pt presenting with impairments listed below, will follow acutely. Recommend HHOT at d/c.      If plan is discharge home, recommend the following:   A little help with walking and/or transfers;A little help with bathing/dressing/bathroom     Functional Status Assessment   Patient has had a recent decline in their functional status and demonstrates the ability to make significant improvements in function in a reasonable and predictable amount of time.     Equipment Recommendations   None recommended by OT     Recommendations for Other Services   PT consult     Precautions/Restrictions   Precautions Precautions: Fall Restrictions Weight Bearing Restrictions Per Provider Order: No     Mobility Bed Mobility Overal bed mobility: Needs Assistance Bed Mobility: Supine to Sit, Sit to Supine     Supine to sit: Min assist Sit to supine: Min assist        Transfers Overall transfer level: Needs assistance Equipment used: Rolling walker (2 wheels) Transfers: Sit to/from Stand Sit to Stand: Contact guard assist                  Balance Overall balance assessment: Needs assistance Sitting-balance support: Bilateral upper extremity  supported, Feet supported Sitting balance-Leahy Scale: Good     Standing balance support: Bilateral upper extremity supported, During functional activity, Reliant on assistive device for balance Standing balance-Leahy Scale: Fair Standing balance comment: Benefits from using RW                           ADL either performed or assessed with clinical judgement   ADL Overall ADL's : Needs assistance/impaired Eating/Feeding: Set up;Sitting   Grooming: Set up;Standing   Upper Body Bathing: Minimal assistance;Standing;Sitting   Lower Body Bathing: Minimal assistance;Sitting/lateral leans;Sit to/from stand   Upper Body Dressing : Minimal assistance;Sitting;Standing   Lower Body Dressing: Minimal assistance;Sit to/from stand;Sitting/lateral leans   Toilet Transfer: Contact guard assist;Ambulation;Rolling walker (2 wheels)   Toileting- Clothing Manipulation and Hygiene: Contact guard assist;Sitting/lateral lean       Functional mobility during ADLs: Contact guard assist;Rolling walker (2 wheels)       Vision Baseline Vision/History: 1 Wears glasses Additional Comments: will further assess, noted decr ability to sustain gaze out of midline, clock draw [facility]     Perception Perception: Impaired Preception Impairment Details: Inattention/Neglect Perception-Other Comments: L inattention   Praxis Praxis: Not tested       Pertinent Vitals/Pain Pain Assessment Pain Assessment: No/denies pain     Extremity/Trunk Assessment Upper Extremity Assessment Upper Extremity Assessment: Generalized weakness;Left hand dominant;LUE deficits/detail LUE Deficits / Details: 4-/5 compared to RUE, coordination deficits LUE Sensation: decreased light touch;decreased proprioception LUE Coordination:  decreased fine motor   Lower Extremity Assessment Lower Extremity Assessment: Defer to PT evaluation   Cervical / Trunk Assessment Cervical / Trunk Assessment: Normal   Communication  Communication Communication: No apparent difficulties   Cognition Arousal: Alert Behavior During Therapy: Flat affect, WFL for tasks assessed/performed Cognition: No apparent impairments                               Following commands: Intact       Cueing  General Comments   Cueing Techniques: Verbal cues;Tactile cues  VSS on RA   Exercises     Shoulder Instructions      Home Living Family/patient expects to be discharged to:: Private residence Living Arrangements: Spouse/significant other Available Help at Discharge: Family;Available PRN/intermittently Type of Home: House Home Access: Stairs to enter [employer] of Steps: 3 Entrance Stairs-Rails: Can reach both Home Layout: Two level;Able to live on main level with bedroom/bathroom     Bathroom Shower/Tub: Arts development officer Toilet: Handicapped height Bathroom Accessibility: Yes   Home Equipment: Agricultural consultant (2 wheels);Grab bars - tub/shower;Shower seat;Cane - quad          Prior Functioning/Environment Prior Level of Function : Independent/Modified Independent;Driving             Mobility Comments: Uses walker inside the home, cane outside ADLs Comments: ind, daughter does pts meds    OT Problem List: Decreased strength;Decreased range of motion;Decreased activity tolerance;Impaired balance (sitting and/or standing);Impaired vision/perception;Decreased cognition;Decreased safety awareness;Impaired UE functional use;Impaired sensation   OT Treatment/Interventions: Self-care/ADL training;Therapeutic exercise;Energy conservation;DME and/or AE instruction;Therapeutic activities;Visual/perceptual remediation/compensation;Patient/family education;Balance training;Cognitive remediation/compensation      OT Goals(Current goals can be found in the care plan section)   Acute Rehab OT Goals Patient Stated Goal: none stated OT Goal Formulation: With patient Time For Goal  Achievement: 08/24/24 Potential to Achieve Goals: Good ADL Goals Pt Will Perform Grooming: sitting;Independently Pt Will Perform Upper Body Dressing: sitting;Independently Pt Will Perform Lower Body Dressing: sitting/lateral leans;sit to/from stand;Independently Pt Will Transfer to Toilet: ambulating;regular height toilet;Independently Pt Will Perform Tub/Shower Transfer: Tub transfer;Shower transfer;ambulating;Independently   OT Frequency:  Min 2X/week    Co-evaluation              AM-PAC OT 6 Clicks Daily Activity     Outcome Measure Help from another person eating meals?: A Little Help from another person taking care of personal grooming?: A Little Help from another person toileting, which includes using toliet, bedpan, or urinal?: A Little Help from another person bathing (including washing, rinsing, drying)?: A Little Help from another person to put on and taking off regular upper body clothing?: A Little Help from another person to put on and taking off regular lower body clothing?: A Little 6 Click Score: 18   End of Session Equipment Utilized During Treatment: Rolling walker (2 wheels);Gait belt Nurse Communication: Mobility status  Activity Tolerance: Patient tolerated treatment well Patient left: in bed;with call bell/phone within reach;with bed alarm set (ECHO in room)  OT Visit Diagnosis: Unsteadiness on feet (R26.81);Other abnormalities of gait and mobility (R26.89);Muscle weakness (generalized) (M62.81)                Time: 1439-1500 OT Time Calculation (min): 21 min Charges:  OT General Charges $OT Visit: 1 Visit OT Evaluation $OT Eval Moderate Complexity: 1 Mod  Rebel Willcutt K, OTD, OTR/L SecureChat Preferred Acute Rehab [phone]   Laneta  K Koonce 08/10/2024, 4:34 PM

## 2024-08-10 NOTE — Anesthesia Preprocedure Evaluation (Signed)
 Anesthesia Evaluation  Patient identified by MRN, date of birth, ID band Patient awake    Reviewed: Unable to perform ROS - Chart review onlyPreop documentation limited or incomplete due to emergent nature of procedure.  History of Anesthesia Complications (+) PONV and history of anesthetic complications  Airway Mallampati: Unable to assess       Dental   Pulmonary asthma , sleep apnea    breath sounds clear to auscultation       Cardiovascular hypertension, Pt. on medications and Pt. on home beta blockers  Rhythm:Regular     Neuro/Psych  Headaches PSYCHIATRIC DISORDERS  Depression    TIACVA, Residual Symptoms    GI/Hepatic hiatal hernia,GERD  ,,  Endo/Other  diabetesHypothyroidism    Renal/GU      Musculoskeletal   Abdominal   Peds  Hematology  (+) Blood dyscrasia, anemia Lab Results      Component                Value               Date                      WBC                      7.3                 08/09/2024                HGB                      8.5 (L)             08/09/2024                HCT                      28.9 (L)            08/09/2024                MCV                      90.3                08/09/2024                PLT                      159                 08/09/2024              Anesthesia Other Findings   Reproductive/Obstetrics                              Anesthesia Physical Anesthesia Plan  ASA: 4 and emergent  Anesthesia Plan: General   Post-op Pain Management: Minimal or no pain anticipated   Induction: Intravenous, Rapid sequence and Cricoid pressure planned  PONV Risk Score and Plan: 4 or greater and Ondansetron  and Dexamethasone   Airway Management Planned: Oral ETT  Additional Equipment: None  Intra-op Plan:   Post-operative Plan: Possible Post-op intubation/ventilation  Informed Consent:      History available from chart only and  Only emergency history available  Plan Discussed with: CRNA  Anesthesia Plan  Comments:          Anesthesia Quick Evaluation

## 2024-08-10 NOTE — Telephone Encounter (Signed)

## 2024-08-10 NOTE — H&P (Addendum)
 NEUROLOGY H&P NOTE   Date of service: August 10, 2024 Patient Name: BRYLEE BERK MRN:  [id number] DOB:  October 23, 1946 Chief Complaint: Left sided weakness  History of Present Illness  SHELLENE SWEIGERT is a 78 y.o. female with an extensive PMHx as documented below (reviewed) who initially presented to the [facility] with acute stroke symptoms of left sided weakness, left facial droop and dysarthria. NIHSS was 5 initially. CT head revealed no ICH. She was evaluated by Teleneurology and was determined to be a candidate for TNK. On repeat exam by CCM after TNK her NIHSS was 7. CTA revealed a right M2 occlusion. She was then sent emergently to [facility] for thrombectomy after informed consent was obtained over the telephone from family, with Dr. Ray, Dr. Kathrene and myself on the call.   Home medications include ASA and simvastatin .   Last known well: 6:30 PM Modified rankin score: 0 IV Thrombolysis: Yes Thrombectomy: Yes   NIHSS components Score: Comment  1a Level of Conscious 0[x]  1[]  2[]  3[]      1b LOC Questions 0[x]  1[]  2[]       1c LOC Commands 0[x]  1[]  2[]       2 Best Gaze 0[]  1[x]  2[]      Hesitancy on leftward gaze, but able to cross the midline  3 Visual 0[x]  1[]  2[]  3[]      4 Facial Palsy 0[]  1[]  2[x]  3[]      5a Motor Arm - left 0[]  1[x]  2[]  3[]  4[]  UN[]    5b Motor Arm - Right 0[x]  1[]  2[]  3[]  4[]  UN[]    6a Motor Leg - Left 0[]  1[]  2[]  3[x]  4[]  UN[]    6b Motor Leg - Right 0[x]  1[]  2[]  3[]  4[]  UN[]    7 Limb Ataxia 0[x]  1[]  2[]  UN[]      8 Sensory 0[]  1[x]  2[]  UN[]      9 Best Language 0[x]  1[]  2[]  3[]      10 Dysarthria 0[]  1[x]  2[]  UN[]      11 Extinct. and Inattention 0[]  1[x]  2[]      Left sided extinction  TOTAL:   10      ROS  Deferred in the context of acuity of presentation.   Past History   Past Medical History:  Diagnosis Date   Anemia    Arthritis    Asthma    Borderline glaucoma    Depression    Diabetes mellitus without complication (HCC)    Edema    Elevated liver enzymes     Environmental allergies    Family history of adverse reaction to anesthesia    Daughter had PONV   GERD (gastroesophageal reflux disease)    Headache    migraine   Heart palpitations    History of hiatal hernia    Hyperlipidemia    Hypertension    Hypothyroidism    Lumbar stenosis    PONV (postoperative nausea and vomiting)    Sleep apnea    does not wear CPAP  my oxygen level drops when I sleep   TIA (transient ischemic attack)    Vitamin B deficiency    Vitamin D  deficiency    Wears glasses    Wears partial dentures    Past Surgical History:  Procedure Laterality Date   ABDOMINAL HYSTERECTOMY       partial   anterior cevical fusion     APPENDECTOMY     BACK SURGERY     lumbar laminectomy   BREAST EXCISIONAL BIOPSY Left 2000   benign   CATARACT EXTRACTION  W/ INTRAOCULAR LENS  IMPLANT, BILATERAL     CHOLECYSTECTOMY     COLONOSCOPY W/ BIOPSIES     MULTIPLE TOOTH EXTRACTIONS     TUBAL LIGATION     Family History  Problem Relation Age of Onset   Breast cancer Sister 23   Uterine cancer Mother    Diabetes Mother    Glaucoma Mother    Heart disease Father    Peripheral vascular disease Father    Social History   Socioeconomic History   Marital status: Married    Spouse name: Not on file   Number of children: Not on file   Years of education: Not on file   Highest education level: Not on file  Occupational History   Not on file  Tobacco Use   Smoking status: Never   Smokeless tobacco: Never  Vaping Use   Vaping status: Never Used  Substance and Sexual Activity   Alcohol  use: No   Drug use: No   Sexual activity: Not on file  Other Topics Concern   Not on file  Social History Narrative   Not on file   Social Drivers of Health   Financial Resource Strain: Not on file  Food Insecurity: No Food Insecurity (07/28/2024)   Hunger Vital Sign    Worried About Running Out of Food in the Last Year: Never true    Ran Out of Food in the Last Year: Never true   Transportation Needs: No Transportation Needs (07/28/2024)   PRAPARE - Administrator, Civil Service (Medical): No    Lack of Transportation (Non-Medical): No  Physical Activity: Not on file  Stress: Not on file  Social Connections: Not on file   No Known Allergies  Medications  No current facility-administered medications for this encounter.  Current Outpatient Medications:    albuterol  (VENTOLIN  HFA) 108 (90 Base) MCG/ACT inhaler, Inhale 2 puffs into the lungs every 6 (six) hours as needed for wheezing., Disp: , Rfl:    aspirin  81 MG chewable tablet, Chew 81 mg by mouth daily., Disp: , Rfl:    celecoxib (CELEBREX) 200 MG capsule, Take 200 mg by mouth 2 (two) times daily., Disp: , Rfl:    Cyanocobalamin  (VITAMIN B-12) 2500 MCG SUBL, Place 2,500 mcg under the tongue daily., Disp: , Rfl:    cyclobenzaprine (FLEXERIL) 10 MG tablet, Take 10 mg by mouth 2 (two) times daily as needed., Disp: , Rfl:    diazepam  (VALIUM ) 5 MG tablet, Take 1-2 tablets (5-10 mg total) by mouth every 6 (six) hours as needed for muscle spasms. (Patient not taking: Reported on 07/28/2024), Disp: 50 tablet, Rfl: 0   docusate sodium  (COLACE) 100 MG capsule, Take 100 mg by mouth daily., Disp: , Rfl:    DULoxetine  (CYMBALTA ) 30 MG capsule, Take 60 mg by mouth daily. (Patient not taking: Reported on 07/28/2024), Disp: , Rfl:    empagliflozin (JARDIANCE) 10 MG TABS tablet, Take 10 mg by mouth daily., Disp: , Rfl:    ergocalciferol  (VITAMIN D2) 50000 units capsule, Take 50,000 Units by mouth every Sunday. (Patient not taking: Reported on 07/28/2024), Disp: , Rfl:    fluticasone  (FLONASE ) 50 MCG/ACT nasal spray, Place 1-2 sprays into both nostrils daily as needed. For nasal congestion., Disp: , Rfl:    furosemide  (LASIX ) 40 MG tablet, 1 tablet twice a day for 3 days, then 1 tablet once a day., Disp: , Rfl:    gabapentin  (NEURONTIN ) 300 MG capsule, Take 300-600 mg by mouth 3 (  three) times daily. Take 300 mg by mouth twice  daily and 600 mg at bedtime, Disp: , Rfl:    hydrochlorothiazide  (HYDRODIURIL ) 25 MG tablet, Take 25 mg by mouth daily., Disp: , Rfl:    HYDROcodone -acetaminophen  (NORCO) 10-325 MG tablet, Take 1 tablet by mouth., Disp: , Rfl:    isosorbide  mononitrate (IMDUR ) 60 MG 24 hr tablet, Take 60 mg by mouth daily., Disp: , Rfl:    levocetirizine (XYZAL ) 5 MG tablet, Take 5 mg by mouth at bedtime., Disp: , Rfl:    levothyroxine  (SYNTHROID , LEVOTHROID) 50 MCG tablet, Take 75 mcg by mouth daily before breakfast., Disp: , Rfl:    magnesium citrate solution, Take 296 mLs by mouth once., Disp: , Rfl:    metFORMIN  (GLUCOPHAGE ) 500 MG tablet, Take 1,000 mg by mouth 2 (two) times daily., Disp: , Rfl:    metoprolol  succinate (TOPROL -XL) 100 MG 24 hr tablet, Take 100 mg by mouth 2 (two) times daily., Disp: , Rfl:    nitrofurantoin, macrocrystal-monohydrate, (MACROBID) 100 MG capsule, Take 100 mg by mouth 2 (two) times daily. (Patient not taking: Reported on 07/28/2024), Disp: , Rfl:    Omega-3 Fatty Acids (OMEGA 3 PO), Take 450 mg by mouth daily. OMEGA XL 150 MG PER CAPSULE (Patient not taking: Reported on 07/28/2024), Disp: , Rfl:    omeprazole (PRILOSEC) 40 MG capsule, Take 40 mg by mouth daily before breakfast., Disp: , Rfl:    ondansetron  (ZOFRAN ) 4 MG tablet, Take 4 mg by mouth every 8 (eight) hours as needed for nausea/vomiting., Disp: , Rfl:    Polyethylene Glycol 400 0.25 % SOLN, Place 1-2 drops into both eyes 3 (three) times daily as needed (for dry eyes.)., Disp: , Rfl:    Potassium 99 MG TABS, Take 99 mg by mouth at bedtime. (Patient not taking: Reported on 07/28/2024), Disp: , Rfl:    predniSONE  (DELTASONE ) 10 MG tablet, Take 10-60 mg by mouth daily. (Patient not taking: Reported on 07/28/2024), Disp: , Rfl:    simvastatin  (ZOCOR ) 40 MG tablet, Take 40 mg by mouth at bedtime., Disp: , Rfl:    Skin Protectants, Misc. (EUCERIN) cream, Apply 1 application topically 5 (five) times daily as needed (for dry skin  throughout day as needed for handwashing)., Disp: , Rfl:    traZODone  (DESYREL ) 100 MG tablet, Take 100 mg by mouth at bedtime. 30 minutes before bedtime., Disp: , Rfl:    triamcinolone  cream (KENALOG ) 0.1 %, Apply 1 application topically 2 (two) times daily as needed (for psoriasis (typically once daily))., Disp: , Rfl:   Facility-Administered Medications Ordered in Other Encounters:    ePHEDrine  injection, , Intravenous, Anesthesia Intra-op, Jama Moats B, CRNA, 5 mg at 08/10/24 0243   lactated ringers  infusion, , Intravenous, Continuous PRN, Jama Moats NOVAK, CRNA, New Bag at 08/10/24 0226   lidocaine  2% (20 mg/mL) 5 mL syringe, , Intravenous, Anesthesia Intra-op, Jama Moats NOVAK, CRNA, 80 mg at 08/10/24 0236   phenylephrine  (NEO-SYNEPHRINE) 20mg /NS 250mL premix infusion, , Intravenous, Continuous PRN, Jama Moats NOVAK, CRNA, Last Rate: 37.5 mL/hr at 08/10/24 0243, 50 mcg/min at 08/10/24 0243   propofol  (DIPRIVAN ) 10 mg/mL bolus/IV push, , Intravenous, Anesthesia Intra-op, Jama Moats B, CRNA, 120 mg at 08/10/24 0236   rocuronium  (ZEMURON ) injection, , Intravenous, Anesthesia Intra-op, Jama Moats NOVAK, CRNA, 40 mg at 08/10/24 0240   succinylcholine  (ANECTINE ) syringe, , Intravenous, Anesthesia Intra-op, Jama Moats NOVAK, CRNA, 160 mg at 08/10/24 9762   Vitals   There were no vitals filed for  this visit.   There is no height or weight on file to calculate BMI.   Physical Exam   Constitutional: Appears well-developed and well-nourished.  Psych: Blunted affect  Eyes: No scleral injection.  HENT: No OP obstruction.  Head: Normocephalic.  Respiratory: Effort normal, non-labored breathing.    Neurologic Examination   See NIHSS  Labs   CBC:  Recent Labs  Lab 08/09/24 2333  WBC 7.3  NEUTROABS 4.9  HGB 8.5*  HCT 28.9*  MCV 90.3  PLT 159   Basic Metabolic Panel:  Lab Results  Component Value Date   NA 141 08/09/2024   K 3.6 08/09/2024   CO2 27 08/09/2024   GLUCOSE 119 (H)  08/09/2024   BUN 18 08/09/2024   CREATININE 1.18 (H) 08/09/2024   CALCIUM 8.8 (L) 08/09/2024   GFRNONAA 48 (L) 08/09/2024   GFRAA >60 06/05/2017   Lipid Panel:  Lab Results  Component Value Date   LDLCALC 20 08/09/2024   HgbA1c:  Lab Results  Component Value Date   HGBA1C 6.8 (H) 09/24/2021   Urine Drug Screen: No results found for: LABOPIA, COCAINSCRNUR, LABBENZ, AMPHETMU, THCU, LABBARB  Alcohol  Level     Component Value Date/Time   [facility] <15 08/09/2024 2333   INR  Lab Results  Component Value Date   INR 1.1 08/09/2024   APTT  Lab Results  Component Value Date   APTT 29 08/09/2024     Assessment  78 y.o. female with an extensive PMHx (reviewed) who initially presented to the [facility] with acute stroke symptoms of left sided weakness, left facial droop and dysarthria. NIHSS was 5 initially. CT head revealed no ICH. She was evaluated by Teleneurology and was determined to be a candidate for TNK. On repeat exam by CCM after TNK her NIHSS was 7. CTA revealed a right M2 occlusion. She was then sent emergently to [facility] for thrombectomy after informed consent was obtained over the telephone from family, with Dr. Ray, Dr. Kathrene and myself on the call.  - Exam on arrival to [facility] reveals left sided weakness, left facial droop, left hemisensory deficit, left sided extinction and dysarthria. NIHSS 10.  - CT head at [facility]: No evidence of acute intracranial abnormality. ASPECTS is 10. Chronic microvascular ischemic disease. - CTA of head and neck at [facility]: Right proximal M2 MCA occlusion with mid M2 MCA reconstitution. Patchy upper lobe consolidation concerning for aspiration and/or pneumonia. Suspected mild interstitial pulmonary edema. - CTP at [facility]: RAPID reports 74 mL of mismatch/penumbra in the right MCA territory. No core infarct identified. 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 Transferred to [facility] for emergent thrombectomy.     Recommendations  1. Admitting to Neuro ICU after VIR  2. Post-TNK and VIR order set to include frequent neuro checks and BP management.  3. No antiplatelet medications or anticoagulants for at least 24 hours following TNK, unless otherwise indicated per Neurointerventionalist.  4. DVT prophylaxis with SCDs.  5. Continue her simvastatin .  6. Will need escalation of antiplatelet therapy if follow up CT at 24 hours is negative for hemorrhagic conversion. 7. Cardiac telemetry  8. TTE.  9. MRI brain  10. PT/OT/Speech.  11. NPO until passes swallow evaluation.  12. Sliding scale insulin .  13. Fasting lipid panel, HgbA1c 14. Family consents to intubation for the procedure but states that they would not want her to remain intubated for an extensive period of time. They consent to Full Code status for the  procedure. Stroke Team will need to discuss with them their long-term code preferences in the AM.  15. CCM consult for pulmonary findings on CTA  I personally spent a total of 60 minutes in the care of this critically-ill patient today including preparing to see the patient, getting/reviewing separately obtained history, performing a medically appropriate exam/evaluation, counseling and educating, placing orders, referring and communicating with other health care professionals, documenting clinical information in the EHR, independently interpreting results, communicating results, and coordinating care.  ______________________________________________________________________   Bonney SHARK, Jakobe Blau, MD [facility]

## 2024-08-10 NOTE — Progress Notes (Signed)
 Patient remained restless throughout remainder of shift. Refusing to keep left straight and sitting up in the bed. Educated frequently on importance of these orders. States she understands but continues to refuse.

## 2024-08-10 NOTE — Progress Notes (Signed)
 NIR Progress Note:  POD#0 s/p Right M2 thrombectomy  Assessment and Plan: Acute ischemic stroke: Routine postoperative care and risk factor optimization.  No activity restrictions from femoral access standpoint.  Continue to evaluate for inciting etiology.  Please call with questions, concerns, or change in patient condition.  Overnight Events: Improvement in clinical symptoms following thrombectomy.  Subjective: No pain. Restless and would like to get out of bed.  Objective: Physical Exam: BP (!) 123/58   Pulse 78   Temp 97.6 F (36.4 C) (Oral)   Resp 17   Wt 110 kg   SpO2 97%   BMI 41.63 kg/m   Mild facial droop. Mild LUE/ LLE drift Access site C/D/I Distal extremity warm and well perfused   Current Facility-Administered Medications:    [START ON 08/11/2024]  stroke: early stages of recovery book, , Does not apply, Once, Merrianne Locus, MD   acetaminophen  (TYLENOL ) tablet 650 mg, 650 mg, Oral, Q4H PRN **OR** acetaminophen  (TYLENOL ) 160 MG/5ML solution 650 mg, 650 mg, Per Tube, Q4H PRN **OR** acetaminophen  (TYLENOL ) suppository 650 mg, 650 mg, Rectal, Q4H PRN, Lindzen, Eric, MD   albuterol  (PROVENTIL ) (2.5 MG/3ML) 0.083% nebulizer solution 2.5 mg, 2.5 mg, Nebulization, Q6H PRN, Remi, Devon, NP   cefTRIAXone  (ROCEPHIN ) 1 g in sodium chloride  0.9 % 100 mL IVPB, 1 g, Intravenous, Q24H, Albustami, Mancel HERO, MD, Stopped at 08/10/24 9157   Chlorhexidine  Gluconate Cloth 2 % PADS 6 each, 6 each, Topical, Daily, Lindzen, Eric, MD, 6 each at 08/10/24 0946   clevidipine  (CLEVIPREX ) infusion 0.5 mg/mL, 0-21 mg/hr, Intravenous, Continuous, Ray Coy, MD   gabapentin  (NEURONTIN ) capsule 300 mg, 300 mg, Oral, TID, Remi Pippin, NP   hydrALAZINE  (APRESOLINE ) injection 10 mg, 10 mg, Intravenous, Q6H PRN, Remi Pippin, NP   HYDROcodone -acetaminophen  (NORCO) 10-325 MG per tablet 1 tablet, 1 tablet, Oral, Q6H PRN, Remi Pippin, NP   insulin  aspart (novoLOG ) injection 0-20 Units, 0-20 Units,  Subcutaneous, TID WC, Lindzen, Eric, MD, 4 Units at 08/10/24 9177   isosorbide  mononitrate (IMDUR ) 24 hr tablet 60 mg, 60 mg, Oral, Daily, Remi, Devon, NP   labetalol  (NORMODYNE ) injection 10 mg, 10 mg, Intravenous, Q4H PRN, Remi, Pippin, NP   latanoprost  (XALATAN ) 0.005 % ophthalmic solution 1 drop, 1 drop, Both Eyes, QHS, Shafer, Devon, NP   [START ON 08/11/2024] levothyroxine  (SYNTHROID ) tablet 75 mcg, 75 mcg, Oral, Q0600, Remi Pippin, NP   pantoprazole  (PROTONIX ) EC tablet 40 mg, 40 mg, Oral, Daily, Shafer, Devon, NP   senna-docusate (Senokot-S) tablet 1 tablet, 1 tablet, Oral, QHS PRN, Lindzen, Eric, MD   simvastatin  (ZOCOR ) tablet 40 mg, 40 mg, Oral, q1800, Remi Pippin, NP   sodium chloride  0.9 % bolus 250 mL, 250 mL, Intravenous, PRN, Ray Coy, MD, Stopped at 08/10/24 0901   traZODone  (DESYREL ) tablet 100 mg, 100 mg, Oral, QHS PRN, Remi Pippin, NP  Labs: CBC Recent Labs    08/09/24 2333  WBC 7.3  HGB 8.5*  HCT 28.9*  PLT 159   BMET Recent Labs    08/09/24 2333  NA 141  K 3.6  CL 100  CO2 27  GLUCOSE 119*  BUN 18  CREATININE 1.18*  CALCIUM 8.8*   LFT Recent Labs    08/09/24 2333  PROT 6.1*  ALBUMIN  3.1*  AST 21  ALT 9  ALKPHOS 43  BILITOT 0.9   PT/INR Recent Labs    08/09/24 2333  LABPROT 14.4  INR 1.1    LOS: 0 days   I spent a total  of 15 minutes in face to face in clinical consultation, greater than 50% of which was counseling/coordinating care.  Margaret Frye 08/10/2024 11:00 AM

## 2024-08-10 NOTE — Progress Notes (Addendum)
 STROKE TEAM PROGRESS NOTE    SIGNIFICANT HOSPITAL EVENTS 9/15 - TNK  9/16- Mechanical thrombectomy   INTERIM HISTORY/SUBJECTIVE Follows with [facility] cardiology. Afib on EKG. Eliquis  copay is $0 and xarelto copay is $0.    OBJECTIVE  CBC    Component Value Date/Time   WBC 7.3 08/09/2024 2333   RBC 3.20 (L) 08/09/2024 2333   HGB 8.5 (L) 08/09/2024 2333   HGB 12.2 12/20/2014 1447   HCT 28.9 (L) 08/09/2024 2333   HCT 37.3 12/20/2014 1447   PLT 159 08/09/2024 2333   PLT 230 12/20/2014 1447   MCV 90.3 08/09/2024 2333   MCV 93 12/20/2014 1447   MCH 26.6 08/09/2024 2333   MCHC 29.4 (L) 08/09/2024 2333   RDW 23.9 (H) 08/09/2024 2333   RDW 14.5 12/20/2014 1447   LYMPHSABS 1.4 08/09/2024 2333   LYMPHSABS 2.4 12/20/2014 1447   MONOABS 0.5 08/09/2024 2333   MONOABS 0.4 12/20/2014 1447   EOSABS 0.3 08/09/2024 2333   EOSABS 0.2 12/20/2014 1447   BASOSABS 0.1 08/09/2024 2333   BASOSABS 0.0 12/20/2014 1447    BMET    Component Value Date/Time   NA 141 08/09/2024 2333   NA 141 12/20/2014 1447   K 3.6 08/09/2024 2333   K 3.5 12/20/2014 1447   CL 100 08/09/2024 2333   CL 106 12/20/2014 1447   CO2 27 08/09/2024 2333   CO2 28 12/20/2014 1447   GLUCOSE 119 (H) 08/09/2024 2333   GLUCOSE 134 (H) 12/20/2014 1447   BUN 18 08/09/2024 2333   BUN 11 12/20/2014 1447   CREATININE 1.18 (H) 08/09/2024 2333   CREATININE 0.77 12/20/2014 1447   CALCIUM 8.8 (L) 08/09/2024 2333   CALCIUM 9.2 12/20/2014 1447   GFRNONAA 48 (L) 08/09/2024 2333   GFRNONAA >60 12/20/2014 1447    IMAGING past 24 hours IR PERCUTANEOUS ART THROMBECTOMY/INFUSION INTRACRANIAL INC DIAG ANGIO Result Date: 08/10/2024 PROCEDURE PERFORMED: 1. Cerebral angiography with stroke thrombectomy 2. Ultrasound guided vascular access 3. Cone beam CT for treatment planning COMPARISON:  CT angiogram of the head and neck performed August 09, 2024 CLINICAL DATA:  78 year old female with acute ischemic stroke with symptoms of  aphasia and left-sided weakness. Patient was a candidate for TNK which was administered. After multidisciplinary review, the patient was offered stroke thrombectomy. INDICATION: Acute ischemic stroke. ANESTHESIA/SEDATION: General anesthesia was utilized for the procedure. CONTRAST:  Approximately 50 cc Ominipque 300 MEDICATIONS: See MAR FLUOROSCOPY TIME:  Fluoroscopy Time: 6.5 minutes, (1009 mGy). COMPLICATIONS: None immediate. BODY OF REPORT: Following a full explanation of the procedure along with the potential associated complications, an informed witnessed consent was obtained. The patient was then placed under general anesthesia by the Department of Anesthesiology at [facility]. The right femoral access site was prepped and draped in the usual sterile fashion. Ultrasound was used to study the right common femoral artery which was patent. Using real-time ultrasound guidance, a 19 gauge introducer needle was used to access the right common femoral artery. Access was performed at 02:40 a.m. A hard copy image ultrasound the saved and stored in PACS. Using this access, a 6 French sheath was placed in the descending thoracic aorta. Next, selective catheterization the right internal carotid artery was performed. A selective arteriogram was performed which demonstrated a proximal right M2 occlusion. Pretreatment TICI score 1. Next, a penumbra 43 and red 62 catheter was used to selectively catheterize the right M2 horizontal segment. The first pass was performed at 0254. Following thrombectomy, recanalization of  the occluded right M2 segment was achieved without evidence of residual thromboembolic disease. There was no evidence of extravasation. Post treatment TICI score 3. After reviewing the imaging, I elected to terminate the procedure at this point. Evaluation of the right femoral access site demonstrated that the site was suitable for a closure device. A 6 French Angio-Seal device was deployed without  complication. Cone beam CT was then performed to evaluate for intracranial hemorrhage and treatment planning. This demonstrated no evidence of significant acute intracranial hemorrhage. The patient was removed from anesthesia and transferred to recovery in stable condition. IMPRESSION: 1. Suction thrombectomy of right M2 occlusion. PLAN: 1. To ICU for routine postoperative supportive care. Electronically Signed   By: Maude Naegeli M.D.   On: 08/10/2024 03:45   IR US  Guide Vasc Access Right Result Date: 08/10/2024 PROCEDURE PERFORMED: 1. Cerebral angiography with stroke thrombectomy 2. Ultrasound guided vascular access 3. Cone beam CT for treatment planning COMPARISON:  CT angiogram of the head and neck performed August 09, 2024 CLINICAL DATA:  78 year old female with acute ischemic stroke with symptoms of aphasia and left-sided weakness. Patient was a candidate for TNK which was administered. After multidisciplinary review, the patient was offered stroke thrombectomy. INDICATION: Acute ischemic stroke. ANESTHESIA/SEDATION: General anesthesia was utilized for the procedure. CONTRAST:  Approximately 50 cc Ominipque 300 MEDICATIONS: See MAR FLUOROSCOPY TIME:  Fluoroscopy Time: 6.5 minutes, (1009 mGy). COMPLICATIONS: None immediate. BODY OF REPORT: Following a full explanation of the procedure along with the potential associated complications, an informed witnessed consent was obtained. The patient was then placed under general anesthesia by the Department of Anesthesiology at [facility]. The right femoral access site was prepped and draped in the usual sterile fashion. Ultrasound was used to study the right common femoral artery which was patent. Using real-time ultrasound guidance, a 19 gauge introducer needle was used to access the right common femoral artery. Access was performed at 02:40 a.m. A hard copy image ultrasound the saved and stored in PACS. Using this access, a 6 French sheath was placed in  the descending thoracic aorta. Next, selective catheterization the right internal carotid artery was performed. A selective arteriogram was performed which demonstrated a proximal right M2 occlusion. Pretreatment TICI score 1. Next, a penumbra 43 and red 62 catheter was used to selectively catheterize the right M2 horizontal segment. The first pass was performed at 0254. Following thrombectomy, recanalization of the occluded right M2 segment was achieved without evidence of residual thromboembolic disease. There was no evidence of extravasation. Post treatment TICI score 3. After reviewing the imaging, I elected to terminate the procedure at this point. Evaluation of the right femoral access site demonstrated that the site was suitable for a closure device. A 6 French Angio-Seal device was deployed without complication. Cone beam CT was then performed to evaluate for intracranial hemorrhage and treatment planning. This demonstrated no evidence of significant acute intracranial hemorrhage. The patient was removed from anesthesia and transferred to recovery in stable condition. IMPRESSION: 1. Suction thrombectomy of right M2 occlusion. PLAN: 1. To ICU for routine postoperative supportive care. Electronically Signed   By: Maude Naegeli M.D.   On: 08/10/2024 03:45   IR CT Head Ltd Result Date: 08/10/2024 PROCEDURE PERFORMED: 1. Cerebral angiography with stroke thrombectomy 2. Ultrasound guided vascular access 3. Cone beam CT for treatment planning COMPARISON:  CT angiogram of the head and neck performed August 09, 2024 CLINICAL DATA:  78 year old female with acute ischemic stroke with symptoms of aphasia and  left-sided weakness. Patient was a candidate for TNK which was administered. After multidisciplinary review, the patient was offered stroke thrombectomy. INDICATION: Acute ischemic stroke. ANESTHESIA/SEDATION: General anesthesia was utilized for the procedure. CONTRAST:  Approximately 50 cc Ominipque 300  MEDICATIONS: See MAR FLUOROSCOPY TIME:  Fluoroscopy Time: 6.5 minutes, (1009 mGy). COMPLICATIONS: None immediate. BODY OF REPORT: Following a full explanation of the procedure along with the potential associated complications, an informed witnessed consent was obtained. The patient was then placed under general anesthesia by the Department of Anesthesiology at [facility]. The right femoral access site was prepped and draped in the usual sterile fashion. Ultrasound was used to study the right common femoral artery which was patent. Using real-time ultrasound guidance, a 19 gauge introducer needle was used to access the right common femoral artery. Access was performed at 02:40 a.m. A hard copy image ultrasound the saved and stored in PACS. Using this access, a 6 French sheath was placed in the descending thoracic aorta. Next, selective catheterization the right internal carotid artery was performed. A selective arteriogram was performed which demonstrated a proximal right M2 occlusion. Pretreatment TICI score 1. Next, a penumbra 43 and red 62 catheter was used to selectively catheterize the right M2 horizontal segment. The first pass was performed at 0254. Following thrombectomy, recanalization of the occluded right M2 segment was achieved without evidence of residual thromboembolic disease. There was no evidence of extravasation. Post treatment TICI score 3. After reviewing the imaging, I elected to terminate the procedure at this point. Evaluation of the right femoral access site demonstrated that the site was suitable for a closure device. A 6 French Angio-Seal device was deployed without complication. Cone beam CT was then performed to evaluate for intracranial hemorrhage and treatment planning. This demonstrated no evidence of significant acute intracranial hemorrhage. The patient was removed from anesthesia and transferred to recovery in stable condition. IMPRESSION: 1. Suction thrombectomy of right M2  occlusion. PLAN: 1. To ICU for routine postoperative supportive care. Electronically Signed   By: Maude Naegeli M.D.   On: 08/10/2024 03:45   CT CEREBRAL PERFUSION W CONTRAST Result Date: 08/10/2024 CLINICAL DATA:  Acute stroke due to ischemia [facility]) EXAM: CT PERFUSION BRAIN TECHNIQUE: Multiphase CT imaging of the brain was performed following IV bolus contrast injection. Subsequent parametric perfusion maps were calculated using RAPID software. RADIATION DOSE REDUCTION: This exam was performed according to the departmental dose-optimization program which includes automated exposure control, adjustment of the mA and/or kV according to patient size and/or use of iterative reconstruction technique. CONTRAST:  40mL OMNIPAQUE  IOHEXOL  350 MG/ML SOLN COMPARISON:  Same day CT head and CTA head/neck. FINDINGS: CT Brain Perfusion Findings: CBF (<30%) Volume: 0mL Perfusion (Tmax>6.0s) volume: 74mL Mismatch Volume: 74mL ASPECTS on noncontrast CT Head: 10 earlier today. Infarct Core: 0 mL Infarction Location:No core infarct identified. The mismatch is in the right MCA territory. IMPRESSION: RAPID reports 74 mL of mismatch/penumbra in the right MCA territory. No core infarct identified. Electronically Signed   By: Gilmore GORMAN Molt M.D.   On: 08/10/2024 00:21   CT ANGIO HEAD NECK W WO CM (CODE STROKE) Addendum Date: 08/10/2024 ADDENDUM REPORT: 08/10/2024 00:08 ADDENDUM: Findings discussed with Dr. Nicholaus via telephone at 11:50 p.m. Electronically Signed   By: Gilmore GORMAN Molt M.D.   On: 08/10/2024 00:08   Result Date: 08/10/2024 CLINICAL DATA:  Neuro deficit, acute, stroke suspected stroke eval, exclude lvo EXAM: CT ANGIOGRAPHY HEAD AND NECK WITH AND WITHOUT CONTRAST TECHNIQUE: Multidetector CT imaging of the  head and neck was performed using the standard protocol during bolus administration of intravenous contrast. Multiplanar CT image reconstructions and MIPs were obtained to evaluate the vascular anatomy. Carotid  stenosis measurements (when applicable) are obtained utilizing NASCET criteria, using the distal internal carotid diameter as the denominator. RADIATION DOSE REDUCTION: This exam was performed according to the departmental dose-optimization program which includes automated exposure control, adjustment of the mA and/or kV according to patient size and/or use of iterative reconstruction technique. CONTRAST:  75mL OMNIPAQUE  IOHEXOL  350 MG/ML SOLN COMPARISON:  Same day CT head. FINDINGS: CTA NECK FINDINGS Aortic arch: Great vessel origins are patent without significant stenosis. Aortic atherosclerosis. Right carotid system: Atherosclerosis at the carotid bifurcation without greater than 50% stenosis. Left carotid system: Atherosclerosis at the carotid bifurcation without greater than 50% stenosis. Vertebral arteries: Codominant. No evidence of dissection, stenosis (50% or greater), or occlusion. Skeleton: No evidence of acute abnormality on limited assessment. C5-C7 solid ACDF Other neck: No acute abnormality on limited assessment. Upper chest: Patchy consolidation and right greater than left upper lobes with interstitial lobular thickening Review of the MIP images confirms the above findings CTA HEAD FINDINGS Anterior circulation: Bilateral intracranial ICAs are patent without significant stenosis. The left MCA and bilateral ACAs are patent without proximal hemodynamically significant stenosis. The right M1 MCA is patent. There is an occluded proximal right M2 MCA with mid M2 MCA reconstitution. Posterior circulation: Bilateral intradural vertebral arteries, basilar artery and bilateral posterior cerebral arteries are patent without proximal hemodynamically significant stenosis. Fetal type PCAs with small vertebrobasilar system, anatomic variant. Venous sinuses: As permitted by contrast timing, patent. Review of the MIP images confirms the above findings Other: Attempting to contact the provider at the time of  dictation for call of report. IMPRESSION: 1. 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 iss 2. Patchy upper lobe consolidation concerning for aspiration and/or pneumonia. 3. Suspected mild interstitial pulmonary edema. Electronically Signed: By: Gilmore GORMAN Molt M.D. On: 08/09/2024 23:41   CT HEAD CODE STROKE WO CONTRAST Result Date: 08/09/2024 CLINICAL DATA:  Code stroke.  Neuro deficit, acute, stroke suspected EXAM: CT HEAD WITHOUT CONTRAST TECHNIQUE: Contiguous axial images were obtained from the base of the skull through the vertex without intravenous contrast. RADIATION DOSE REDUCTION: This exam was performed according to the departmental dose-optimization program which includes automated exposure control, adjustment of the mA and/or kV according to patient size and/or use of iterative reconstruction technique. COMPARISON:  CT head 12/20/2014. FINDINGS: Brain: No evidence of acute infarction, hemorrhage, hydrocephalus, extra-axial collection or mass lesion/mass effect. Patchy white matter hypodensities are compatible with chronic microvascular disease. Vascular: No hyperdense vessel. Skull: No acute fracture. Sinuses/Orbits: Clear sinuses.  No acute orbital findings. Other: No mastoid effusions ASPECTS [facility] Stroke Program Early CT Score) Total score (0-10 with 10 being normal): 10. Other: Attempted to call report at the time of dictation. IMPRESSION: 1. No evidence of acute intracranial abnormality. ASPECTS is 10. 2. Chronic microvascular ischemic disease. Code stroke imaging results were communicated on 08/09/2024 at 10:40 pm to provider Dr. Dicky via telephone. Electronically Signed   By: Gilmore GORMAN Molt M.D.   On: 08/09/2024 22:41    Vitals:   08/10/24 0700 08/10/24 0730 08/10/24 0800 08/10/24 0813  BP: (!) 111/90 (!) 107/39 (!) 109/58 (!) 109/58  Pulse: (!) 56 (!) 58 (!) 57 (!) 57  Resp: 13 13 12    Temp:  97.6 F (36.4 C)    TempSrc:  Oral    SpO2: 96%  91% 95% 99%   Weight:         PHYSICAL EXAM General:  Alert, well-nourished, well-developed patient in no acute distress Psych:  Mood and affect appropriate for situation CV: Regular rate and rhythm on monitor Respiratory:  Regular, unlabored respirations on room air GI: Abdomen soft and nontender   NEURO:  Mental Status: AA&Ox3, patient is able to give clear and coherent history Speech/Language: speech is without dysarthria or aphasia.  Naming, repetition, fluency, and comprehension intact.  Cranial Nerves:  II: PERRL. Visual fields full.  III, IV, VI: EOMI. Eyelids elevate symmetrically.  V: Sensation is intact to light touch and symmetrical to face.  VII: Facial droop left  VIII: hearing intact to voice. IX, X: Palate elevates symmetrically. Phonation is normal.  KP:Dynloizm shrug 5/5. XII: tongue is midline without fasciculations. Motor: LUE and LLE with mild drift  Full strength RUE and RLE Tone: is normal and bulk is normal Sensation- Intact to light touch bilaterally.  Mild Left neglect Coordination: FTN intact bilaterally, HKS: no ataxia in BLE in relation to weakness Gait- deferred  Most Recent NIH 3     ASSESSMENT/PLAN  Ms. MAKALYNN BERWANGER is a 78 y.o. female with history of IDA, Anxiety and Depression, HTN, HLD, Hypothyroidism, OSA, T2DM, HFpEF, PVD, PVCs, TIA who initially presented as a code stroke to [facility]. She received TNK and was transferred to [facility] for a mechanical thrombectomy.  NIH on Admission 5. CTA Head and neck with a right M2 MCA occlusion. Patient became hypotensive and NIH reportedly worsened to 12 and then 22. NIH on arrival to [facility] 10.   Stroke:  right MCA territory infarct s/p TNK and IR with TICI3, etiology: embolic in the setting of A-fib, new diagnosis Code Stroke CT head No acute abnormality. ASPECTS 10.  CTA head & neck Right proximal M2 MCA occlusion with mid M2 MCA reconstitution.  CT perfusion 0/74cc S/p IR with TICI3 reperfusion MRI pending MRA   pending  2D Echo pending  LDL 24 HgbA1c 6.0 VTE prophylaxis - SCDs aspirin  81 mg daily prior to admission, now on no antithrombotic for 24 hours post TNK Therapy recommendations:  HH PT Disposition:  pending   New diagnosed atrial fibrillation Heart failure with preserved EF Continue telemetry monitoring EKG confirmed Afib Rate controlled 2D echo pending On home imdur  Begin anticoagulation with Eliquis  once 24 hours post TNK if MRI is no significant bleeding  ? Aspiration pneumonia CTA neck showed Patchy upper lobe consolidation concerning for aspiration and/or pneumonia. Suspected mild interstitial pulmonary edema.  On Rocephin   Hypertension Home meds:  metoprolol , Imdur , hydrochlorothiazide , Lasix  Stable On home Imdur  Follows with [facility] clinic cardiology, will need an outpatient follow-up appointment for medication management and new onset A-fib Blood Pressure Goal: SBP 120-160 for first 24 hours then less than 180  Long-term goal normotensive  Hyperlipidemia Home meds:  simvastatin  40, resumed in hospital LDL 24, goal < 70 No high intensity statin given LDL at goal Continue statin at discharge  Diabetes type II Controlled Home meds:  metformin  HgbA1c 6.0, goal < 7.0 CBGs SSI Recommend close follow-up with PCP for better DM control  Other Stroke Risk Factors Obesity, Body mass index is 41.63 kg/m., BMI >/= 30 associated with increased stroke risk, recommend weight loss, diet and exercise as appropriate  OSA ? TIA vs. Complicated migraine in 12/2014, had holter monitor 2-3 days and was told no afib.  Other Active Problems AKI Cre 1.18 - pending Anemia Hb 8.5 -  pending Back pain Home meds: Hydrocodone , gabapentin , celebrex Insomnia Trazadone   Hospital day # 0  Patient seen and examined by NP/APP with MD. MD to update note as needed.   Jorene Last, DNP, FNP-BC [facility] Pager: [phone]  ATTENDING NOTE: I reviewed above note and  agree with the assessment and plan. Pt was seen and examined.   Daughter at bedside.  Patient reclining in bed, stated symptoms has improved, still has mild left-sided weakness and facial droop, but much better from yesterday.  On exam, patient awake, alert, eyes open, orientated to age, place, time and people. No aphasia, fluent language, following all simple commands. Able to name and repeat. No gaze palsy, tracking bilaterally, visual field full.  Mild left facial droop. Tongue midline.  Left upper extremity 4 -/5 with drift, left lower extremity proximal 4 -/5, distal 5/5. Sensation symmetrical bilaterally, b/l FTN intact but slow on the left, gait not tested.   Patient telemetry concerning for A-fib.  EKG subsequently confirmed A-fib.  Rate controlled.  Home Imdur .  2D echo pending.  Patient still likely due to new diagnosis of A-fib/A-flutter.  Will start Eliquis  after 24 hours of TNK if MRI no bleeding.  Patient hemoglobin 8.5, will need close monitoring.  Continue home simvastatin .  PT and OT recommend home health.  For detailed assessment and plan, please refer to above as I have made changes wherever appropriate.   Ary Cummins, MD PhD Stroke Neurology 08/10/2024 4:24 PM  This patient is critically ill due to stroke, new diagnosed A-fib, status post thrombectomy and at significant risk of neurological worsening, death form heart failure, recurrent stroke, hemorrhagic transformation. This patient's care requires constant monitoring of vital signs, hemodynamics, respiratory and cardiac monitoring, review of multiple databases, neurological assessment, discussion with family, other specialists and medical decision making of high complexity. I spent additional 30 minutes of neurocritical care time in the care of this patient. I had long discussion with daughter and the patient at bedside, updated pt current condition, treatment plan and potential prognosis, and answered all the questions.  They  expressed understanding and appreciation.      To contact Stroke Continuity provider, please refer to [email or web address]. After hours, contact General Neurology

## 2024-08-11 ENCOUNTER — Encounter (HOSPITAL_COMMUNITY): Payer: Self-pay | Admitting: Radiology

## 2024-08-11 DIAGNOSIS — I5030 Unspecified diastolic (congestive) heart failure: Secondary | ICD-10-CM

## 2024-08-11 DIAGNOSIS — D649 Anemia, unspecified: Secondary | ICD-10-CM

## 2024-08-11 DIAGNOSIS — I4891 Unspecified atrial fibrillation: Secondary | ICD-10-CM

## 2024-08-11 DIAGNOSIS — R29703 NIHSS score 3: Secondary | ICD-10-CM

## 2024-08-11 DIAGNOSIS — I110 Hypertensive heart disease with heart failure: Secondary | ICD-10-CM

## 2024-08-11 DIAGNOSIS — I6340 Cerebral infarction due to embolism of unspecified cerebral artery: Secondary | ICD-10-CM

## 2024-08-11 LAB — CBC
HCT: 23.3 % — ABNORMAL LOW (ref 36.0–46.0)
Hemoglobin: 7 g/dL — ABNORMAL LOW (ref 12.0–15.0)
MCH: 26.8 pg (ref 26.0–34.0)
MCHC: 30 g/dL (ref 30.0–36.0)
MCV: 89.3 fL (ref 80.0–100.0)
Platelets: 137 K/uL — ABNORMAL LOW (ref 150–400)
RBC: 2.61 MIL/uL — ABNORMAL LOW (ref 3.87–5.11)
RDW: 24 % — ABNORMAL HIGH (ref 11.5–15.5)
WBC: 5.3 K/uL (ref 4.0–10.5)
nRBC: 0.4 % — ABNORMAL HIGH (ref 0.0–0.2)

## 2024-08-11 LAB — GLUCOSE, CAPILLARY
Glucose-Capillary: 146 mg/dL — ABNORMAL HIGH (ref 70–99)
Glucose-Capillary: 174 mg/dL — ABNORMAL HIGH (ref 70–99)
Glucose-Capillary: 214 mg/dL — ABNORMAL HIGH (ref 70–99)
Glucose-Capillary: 190 mg/dL — ABNORMAL HIGH (ref 70–99)

## 2024-08-11 LAB — BASIC METABOLIC PANEL WITH GFR
Anion gap: 8 (ref 5–15)
BUN: 11 mg/dL (ref 8–23)
CO2: 27 mmol/L (ref 22–32)
Calcium: 8.8 mg/dL — ABNORMAL LOW (ref 8.9–10.3)
Chloride: 105 mmol/L (ref 98–111)
Creatinine, Ser: 1.17 mg/dL — ABNORMAL HIGH (ref 0.44–1.00)
GFR, Estimated: 48 mL/min — ABNORMAL LOW (ref >60)
Glucose, Bld: 168 mg/dL — ABNORMAL HIGH (ref 70–99)
Potassium: 3.6 mmol/L (ref 3.5–5.1)
Sodium: 140 mmol/L (ref 135–145)

## 2024-08-11 LAB — PREPARE RBC (CROSSMATCH)

## 2024-08-11 MED ORDER — POTASSIUM CHLORIDE CRYS ER 20 MEQ PO TBCR
40.0000 meq | EXTENDED_RELEASE_TABLET | Freq: Once | ORAL | Status: AC
Start: 2024-08-11 — End: 2024-08-11
  Administered 2024-08-11: 40 meq via ORAL
  Filled 2024-08-11: qty 2

## 2024-08-11 MED ORDER — IRON SUCROSE 200 MG IVPB - SIMPLE MED
200.0000 mg | Freq: Once | Status: DC
  Filled 2024-08-11: qty 110

## 2024-08-11 MED ORDER — SODIUM CHLORIDE 0.9% IV SOLUTION
Freq: Once | INTRAVENOUS | Status: AC
Start: 2024-08-11 — End: 2024-08-11

## 2024-08-11 MED ORDER — SODIUM CHLORIDE 0.9 % IV SOLN
200.0000 mg | Freq: Once | INTRAVENOUS | Status: AC
  Administered 2024-08-11: 200 mg via INTRAVENOUS
  Filled 2024-08-11: qty 10

## 2024-08-11 MED ORDER — FUROSEMIDE 40 MG PO TABS
40.0000 mg | ORAL_TABLET | Freq: Once | ORAL | Status: AC
  Administered 2024-08-11: 40 mg via ORAL
  Filled 2024-08-11: qty 1

## 2024-08-11 NOTE — Evaluation (Signed)
 Speech Language Pathology Evaluation Patient Details Name: FELICITE ZEIMET MRN: [id number] DOB: 03/10/1946 Today's Date: 08/11/2024 Time: 9095-9078 SLP Time Calculation (min) (ACUTE ONLY): 17 min  Problem List:  Patient Active Problem List   Diagnosis Date Noted   Stroke (cerebrum) (HCC) 08/10/2024   Acute cerebrovascular accident (CVA) due to ischemia (HCC) 08/09/2024   Iron  deficiency anemia 07/28/2024   Chronic, continuous use of opioids 09/23/2021   Intractable low back pain 09/23/2021   Lumbar herniated disc 09/23/2021   History of lumbar laminectomy 09/23/2021   Type II diabetes mellitus with complication (HCC) 03/03/2019   Degenerative spondylolisthesis 06/13/2017   Hypothyroidism 05/11/2014   HTN (hypertension) 05/10/2014   Past Medical History:  Past Medical History:  Diagnosis Date   Anemia    Arthritis    Asthma    Borderline glaucoma    Depression    Diabetes mellitus without complication (HCC)    Edema    Elevated liver enzymes    Environmental allergies    Family history of adverse reaction to anesthesia    Daughter had PONV   GERD (gastroesophageal reflux disease)    Headache    migraine   Heart palpitations    History of hiatal hernia    Hyperlipidemia    Hypertension    Hypothyroidism    Lumbar stenosis    PONV (postoperative nausea and vomiting)    Sleep apnea    does not wear CPAP  my oxygen level drops when I sleep   TIA (transient ischemic attack)    Vitamin B deficiency    Vitamin D  deficiency    Wears glasses    Wears partial dentures    Past Surgical History:  Past Surgical History:  Procedure Laterality Date   ABDOMINAL HYSTERECTOMY       partial   anterior cevical fusion     APPENDECTOMY     BACK SURGERY     lumbar laminectomy   BREAST EXCISIONAL BIOPSY Left 2000   benign   CATARACT EXTRACTION W/ INTRAOCULAR LENS  IMPLANT, BILATERAL     CHOLECYSTECTOMY     COLONOSCOPY W/ BIOPSIES     IR CT HEAD LTD  08/10/2024   IR  PERCUTANEOUS ART THROMBECTOMY/INFUSION INTRACRANIAL INC DIAG ANGIO  08/10/2024   IR US  GUIDE VASC ACCESS RIGHT  08/10/2024   MULTIPLE TOOTH EXTRACTIONS     RADIOLOGY WITH ANESTHESIA N/A 08/10/2024   Procedure: RADIOLOGY WITH ANESTHESIA;  Surgeon: Radiologist, Medication, MD;  Location: [facility] OR;  Service: Radiology;  Laterality: N/A;   TUBAL LIGATION     HPI:  78 yo female admits to [facility] on 08/10/24 from [place] for sudden onset of L sided weakness and numbness. TNK administered 2247 on 9/15. 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 S/p R M2 thrombectomy 9/16. PMH: DM, CA, CHF, hypothyroidism   Assessment / Plan / Recommendation Clinical Impression   Pt presents with grossly functional speech, language, and cognitive functions per informal cognitive-linguistic assessment today. Pt reported baseline memory and attention deficits with help from her daughter to manage medications and attend medical appointments. Conversational speech 100% fluent and intelligible. No dysarthria noted. Pt answered expressive and receptive language questions with 100% accuracy.   No further acute SLP needs identified at this time. SLP will sign off.    SLP Assessment  SLP Recommendation/Assessment: Patient does not need any further Speech Language Pathology Services SLP Visit Diagnosis:  (r/o cognitive-linguistic deficits)     Assistance Recommended at  Discharge  Set up Supervision/Assistance  Functional Status Assessment Patient has not had a recent decline in their functional status  Frequency and Duration     D/c acute SLP      SLP Evaluation Cognition  Overall Cognitive Status: History of cognitive impairments - at baseline Arousal/Alertness: Awake/alert Orientation Level: Oriented X4 Year: 2025 Month: September Day of Week: Correct Attention: Focused;Sustained Focused Attention: Appears intact Sustained Attention: Appears intact Awareness: Appears  intact Safety/Judgment: Appears intact       Comprehension  Auditory Comprehension Overall Auditory Comprehension: Appears within functional limits for tasks assessed Yes/No Questions: Within Functional Limits Commands: Within Functional Limits Visual Recognition/Discrimination Discrimination: Within Function Limits    Expression Expression Primary Mode of Expression: Verbal Verbal Expression Overall Verbal Expression: Appears within functional limits for tasks assessed Initiation: No impairment Automatic Speech: Name;Day of week Level of Generative/Spontaneous Verbalization: Conversation Repetition: No impairment Naming: No impairment Pragmatics: No impairment Non-Verbal Means of Communication: Not applicable   Oral / Motor  Oral Motor/Sensory Function Overall Oral Motor/Sensory Function: Within functional limits Motor Speech Overall Motor Speech: Appears within functional limits for tasks assessed Respiration: Within functional limits Resonance: Within functional limits Articulation: Within functional limitis Intelligibility: Intelligible Motor Planning: Within functional limits Motor Speech Errors: Not applicable            Peyton JINNY Rummer 08/11/2024, 1:10 PM

## 2024-08-11 NOTE — Plan of Care (Signed)
  Problem: Education: Goal: Knowledge of disease or condition will improve Outcome: Progressing Goal: Knowledge of secondary prevention will improve (MUST DOCUMENT ALL) Outcome: Progressing Goal: Knowledge of patient specific risk factors will improve (DELETE if not current risk factor) Outcome: Progressing   Problem: Ischemic Stroke/TIA Tissue Perfusion: Goal: Complications of ischemic stroke/TIA will be minimized Outcome: Progressing   Problem: Coping: Goal: Will verbalize positive feelings about self Outcome: Progressing Goal: Will identify appropriate support needs Outcome: Progressing   Problem: Health Behavior/Discharge Planning: Goal: Ability to manage health-related needs will improve Outcome: Progressing Goal: Goals will be collaboratively established with patient/family Outcome: Progressing   Problem: Self-Care: Goal: Ability to communicate needs accurately will improve Outcome: Progressing   Problem: Nutrition: Goal: Risk of aspiration will decrease Outcome: Progressing Goal: Dietary intake will improve Outcome: Progressing   Problem: Education: Goal: Knowledge of General Education information will improve Description: Including pain rating scale, medication(s)/side effects and non-pharmacologic comfort measures Outcome: Progressing   Problem: Health Behavior/Discharge Planning: Goal: Ability to manage health-related needs will improve Outcome: Progressing   Problem: Clinical Measurements: Goal: Ability to maintain clinical measurements within normal limits will improve Outcome: Progressing Goal: Will remain free from infection Outcome: Progressing Goal: Diagnostic test results will improve Outcome: Progressing Goal: Respiratory complications will improve Outcome: Progressing   Problem: Activity: Goal: Risk for activity intolerance will decrease Outcome: Progressing   Problem: Nutrition: Goal: Adequate nutrition will be maintained Outcome:  Progressing   Problem: Coping: Goal: Level of anxiety will decrease Outcome: Progressing   Problem: Elimination: Goal: Will not experience complications related to bowel motility Outcome: Progressing Goal: Will not experience complications related to urinary retention Outcome: Progressing   Problem: Pain Managment: Goal: General experience of comfort will improve and/or be controlled Outcome: Progressing   Problem: Safety: Goal: Ability to remain free from injury will improve Outcome: Progressing   Problem: Skin Integrity: Goal: Risk for impaired skin integrity will decrease Outcome: Progressing

## 2024-08-11 NOTE — Progress Notes (Addendum)
 STROKE TEAM PROGRESS NOTE    SIGNIFICANT HOSPITAL EVENTS 9/15 - TNK  9/16- Mechanical thrombectomy   INTERIM HISTORY/SUBJECTIVE Follows with [facility] cardiology. Afib on EKG. Eliquis  copay is $0 and xarelto copay is $0.    OBJECTIVE  CBC    Component Value Date/Time   WBC 5.3 08/11/2024 0613   RBC 2.61 (L) 08/11/2024 0613   HGB 7.0 (L) 08/11/2024 0613   HGB 12.2 12/20/2014 1447   HCT 23.3 (L) 08/11/2024 0613   HCT 37.3 12/20/2014 1447   PLT 137 (L) 08/11/2024 0613   PLT 230 12/20/2014 1447   MCV 89.3 08/11/2024 0613   MCV 93 12/20/2014 1447   MCH 26.8 08/11/2024 0613   MCHC 30.0 08/11/2024 0613   RDW 24.0 (H) 08/11/2024 0613   RDW 14.5 12/20/2014 1447   LYMPHSABS 1.4 08/09/2024 2333   LYMPHSABS 2.4 12/20/2014 1447   MONOABS 0.5 08/09/2024 2333   MONOABS 0.4 12/20/2014 1447   EOSABS 0.3 08/09/2024 2333   EOSABS 0.2 12/20/2014 1447   BASOSABS 0.1 08/09/2024 2333   BASOSABS 0.0 12/20/2014 1447    BMET    Component Value Date/Time   NA 140 08/11/2024 0613   NA 141 12/20/2014 1447   K 3.6 08/11/2024 0613   K 3.5 12/20/2014 1447   CL 105 08/11/2024 0613   CL 106 12/20/2014 1447   CO2 27 08/11/2024 0613   CO2 28 12/20/2014 1447   GLUCOSE 168 (H) 08/11/2024 0613   GLUCOSE 134 (H) 12/20/2014 1447   BUN 11 08/11/2024 0613   BUN 11 12/20/2014 1447   CREATININE 1.17 (H) 08/11/2024 0613   CREATININE 0.77 12/20/2014 1447   CALCIUM 8.8 (L) 08/11/2024 0613   CALCIUM 9.2 12/20/2014 1447   GFRNONAA 48 (L) 08/11/2024 0613   GFRNONAA >60 12/20/2014 1447    IMAGING past 24 hours MR ANGIO HEAD WO CONTRAST Result Date: 08/11/2024 EXAM: MR Angiography Head without intravenous Contrast. 08/10/2024 10:13:33 PM TECHNIQUE: Magnetic resonance angiography images of the head without intravenous contrast. Multiplanar 2D and 3D reformatted images are provided for review. COMPARISON: CT angiogram of the head dated 08/09/2024. CLINICAL HISTORY: Stroke, follow up. FINDINGS:  ANTERIOR CIRCULATION: Moderate stenosis of a proximal M2 branch of the right middle cerebral artery. There is no focal occlusion clearly demonstrated. POSTERIOR CIRCULATION: Fetal origin of the right posterior cerebral artery and fetal type origin of the left posterior cerebral artery, with a relatively diminutive vertebral basilar system, normal variant. The left P1 segment is hypoplastic. IMPRESSION: 1. Moderate stenosis of a proximal M2 branch of the right middle cerebral artery. 2. Fetal origin of the right posterior cerebral artery and fetal type origin of the left posterior cerebral artery, with a relatively diminutive vertebral basilar system, normal variant. The left P1 segment is hypoplastic. Electronically signed by: Evalene Coho MD 08/11/2024 04:20 AM EDT RP Workstation: [id number]   MR BRAIN WO CONTRAST Result Date: 08/11/2024 EXAM: MRI BRAIN WITHOUT CONTRAST 08/10/2024 10:13:15 PM TECHNIQUE: Multiplanar multisequence MRI of the head/brain was performed without the administration of intravenous contrast. COMPARISON: CT of the head dated 08/09/2024. CLINICAL HISTORY: Stroke, follow up. FINDINGS: BRAIN AND VENTRICLES: No acute infarct. No intracranial hemorrhage. No mass. No midline shift. No hydrocephalus. The sella is unremarkable. Normal flow voids. Age-related atrophy and moderately advanced diffuse cerebral white matter disease. ORBITS: The patient is status post bilateral cataracts surgery. SINUSES AND MASTOIDS: No acute abnormality. BONES AND SOFT TISSUES: Normal marrow signal. No acute soft tissue abnormality. IMPRESSION: 1. No acute intracranial  abnormality. 2. Age-related atrophy and moderately advanced diffuse cerebral white matter disease. Electronically signed by: Evalene Coho MD 08/11/2024 04:02 AM EDT RP Workstation: [id number]   ECHOCARDIOGRAM COMPLETE Result Date: 08/10/2024    ECHOCARDIOGRAM REPORT   Patient Name:   Margaret Frye Date of Exam: 08/10/2024 Medical Rec #:   [id number]   Height:       64.0 in Accession #:    [id number]  Weight:       242.5 lb Date of Birth:  14-Feb-1946   BSA:          2.123 m Patient Age:    78 years    BP:           109/53 mmHg Patient Gender: F           HR:           68 bpm. Exam Location:  Inpatient Procedure: 2D Echo and Intracardiac Opacification Agent (Both Spectral and Color            Flow Doppler were utilized during procedure). Indications:    CVA  History:        Patient has no prior history of Echocardiogram examinations.                 Risk Factors:Hypertension.  Sonographer:    Charmaine Gaskins Referring Phys: [id number] Jacque Byron IMPRESSIONS  1. Left ventricular ejection fraction, by estimation, is 60 to 65%. The left ventricle has normal function. The left ventricle has no regional wall motion abnormalities. Left ventricular diastolic function could not be evaluated.  2. Right ventricular systolic function is normal. The right ventricular size is normal.  3. Left atrial size was mild to moderately dilated.  4. The mitral valve is normal in structure. Mild mitral valve regurgitation. No evidence of mitral stenosis.  5. The aortic valve is normal in structure. Aortic valve regurgitation is trivial. No aortic stenosis is present.  6. The inferior vena cava is normal in size with greater than 50% respiratory variability, suggesting right atrial pressure of 3 mmHg. FINDINGS  Left Ventricle: Left ventricular ejection fraction, by estimation, is 60 to 65%. The left ventricle has normal function. The left ventricle has no regional wall motion abnormalities. The left ventricular internal cavity size was normal in size. There is  no left ventricular hypertrophy. Left ventricular diastolic function could not be evaluated due to atrial fibrillation. Left ventricular diastolic function could not be evaluated. Right Ventricle: The right ventricular size is normal. No increase in right ventricular wall thickness. Right ventricular systolic function is  normal. Left Atrium: Left atrial size was mild to moderately dilated. Right Atrium: Right atrial size was normal in size. Pericardium: There is no evidence of pericardial effusion. Mitral Valve: The mitral valve is normal in structure. Mild mitral annular calcification. Mild mitral valve regurgitation. No evidence of mitral valve stenosis. Tricuspid Valve: The tricuspid valve is normal in structure. Tricuspid valve regurgitation is not demonstrated. No evidence of tricuspid stenosis. Aortic Valve: The aortic valve is normal in structure. There is mild to moderate aortic valve annular calcification. Aortic valve regurgitation is trivial. No aortic stenosis is present. Pulmonic Valve: The pulmonic valve was normal in structure. Pulmonic valve regurgitation is not visualized. No evidence of pulmonic stenosis. Aorta: The aortic root is normal in size and structure. Venous: The inferior vena cava is normal in size with greater than 50% respiratory variability, suggesting right atrial pressure of 3 mmHg. IAS/Shunts: No atrial level shunt detected by  color flow Doppler.  LEFT VENTRICLE PLAX 2D LVIDd:         4.80 cm     Diastology LVIDs:         2.60 cm     LV e' medial:    7.54 cm/s LV PW:         1.10 cm     LV E/e' medial:  19.1 LV IVS:        1.00 cm     LV e' lateral:   10.25 cm/s LVOT diam:     1.90 cm     LV E/e' lateral: 14.1 LVOT Area:     2.84 cm  LV Volumes (MOD) LV vol d, MOD A2C: 76.1 ml LV vol d, MOD A4C: 79.5 ml LV vol s, MOD A2C: 21.3 ml LV vol s, MOD A4C: 21.6 ml LV SV MOD A2C:     54.8 ml LV SV MOD A4C:     79.5 ml LV SV MOD BP:      55.9 ml RIGHT VENTRICLE RV Basal diam:  3.60 cm RV Mid diam:    3.30 cm RV S prime:     15.20 cm/s LEFT ATRIUM             Index        RIGHT ATRIUM           Index LA diam:        4.10 cm 1.93 cm/m   RA Area:     12.00 cm LA Vol (A2C):   98.0 ml 46.15 ml/m  RA Volume:   27.30 ml  12.86 ml/m LA Vol (A4C):   87.7 ml 41.30 ml/m LA Biplane Vol: 95.5 ml 44.98 ml/m   AORTA  Ao Asc diam: 3.30 cm MITRAL VALVE MV Area (PHT): 3.65 cm     SHUNTS MV Decel Time: 208 msec     Systemic Diam: 1.90 cm MV E velocity: 144.33 cm/s Aditya Sabharwal Electronically signed by Ria Commander Signature Date/Time: 08/10/2024/4:28:01 PM    Final     Vitals:   08/11/24 0924 08/11/24 1000 08/11/24 1100 08/11/24 1200  BP: 110/70 (!) 122/52 (!) 122/54 102/65  Pulse: 85 80 81 75  Resp:  14 16 13   Temp:    98.1 F (36.7 C)  TempSrc:    Axillary  SpO2:  97% 96% 95%  Weight:         PHYSICAL EXAM General:  Alert, well-nourished, well-developed patient in no acute distress Psych:  Mood and affect appropriate for situation CV: Regular rate and rhythm on monitor Respiratory:  Regular, unlabored respirations on room air GI: Abdomen soft and nontender   NEURO:  Mental Status: AA&Ox3, patient is able to give clear and coherent history Speech/Language: speech is without dysarthria or aphasia.  Naming, repetition, fluency, and comprehension intact.  Cranial Nerves:  II: PERRL. Visual fields full.  III, IV, VI: EOMI. Eyelids elevate symmetrically.  V: Sensation is intact to light touch and symmetrical to face.  VII: Facial droop left  VIII: hearing intact to voice. IX, X: Palate elevates symmetrically. Phonation is normal.  KP:Dynloizm shrug 5/5. XII: tongue is midline without fasciculations. Motor: LUE and LLE with mild drift  Full strength RUE and RLE Tone: is normal and bulk is normal Sensation- Intact to light touch bilaterally.  Mild Left neglect Coordination: FTN intact bilaterally, HKS: no ataxia in BLE in relation to weakness Gait- deferred  Most Recent NIH 3     ASSESSMENT/PLAN  Ms.  Margaret Frye is a 78 y.o. female with history of IDA, Anxiety and Depression, HTN, HLD, Hypothyroidism, OSA, T2DM, HFpEF, PVD, PVCs, TIA who initially presented as a code stroke to [facility]. She received TNK and was transferred to [facility] for a mechanical thrombectomy.  NIH on Admission 5.  CTA Head and neck with a right M2 MCA occlusion. Patient became hypotensive and NIH reportedly worsened to 12 and then 22. NIH on arrival to [facility] 10.   Stroke:  right insular cortex and left frontal small infarcts s/p TNK and IR with TICI3, etiology: embolic in the setting of A-fib, new diagnosis Code Stroke CT head No acute abnormality. ASPECTS 10.  CTA head & neck Right proximal M2 MCA occlusion with mid M2 MCA reconstitution.  CT perfusion 0/74cc S/p IR with TICI3 reperfusion MRI right insular cortex and left frontal small infarcts on my read MRA moderate stenosis of proximal R M2, hypoplastic posterior circulation, bilateral fetal PCAs 2D Echo EF 60-65% no shunts LDL 24 HgbA1c 6.0 VTE prophylaxis - SCDs aspirin  81 mg daily prior to admission, now on no antithrombotic given severe anemia Therapy recommendations:  HH PT Disposition:  pending   New diagnosed atrial fibrillation Heart failure with preserved EF Home meds: Lasix  40 twice daily, hydrochlorothiazide  20 daily, metoprolol  125 twice daily and Imdur  Continue telemetry monitoring EKG confirmed Afib Rate controlled with intermittent RVR 2D echo EF 60-65%, no shunts. On home metoprolol  but lower dose 50mg  bid On home Lasix  but lower dose 40 x 1 On home imdur  Begin anticoagulation with Eliquis  once Hb improves Follows with [facility] cardiology, will need an outpatient follow-up appointment for medication management and new onset A-fib  No clinical aspiration pneumonia CTA neck showed Patchy upper lobe consolidation concerning for aspiration and/or pneumonia. Suspected mild interstitial pulmonary edema.  No fever or leukocytosis No cough Off Rocephin   Hypertension Home meds:  metoprolol , Imdur , hydrochlorothiazide , Lasix  Stable on the lower end On home Imdur  On home metoprolol  but lower dose 50mg  bid On home Lasix  but lower dose 40 x 1 Long-term goal normotensive  Hyperlipidemia Home meds:  simvastatin  40,  resumed in hospital LDL 24, goal < 70 No high intensity statin given LDL at goal Continue statin at discharge  Diabetes type II Controlled Home meds:  metformin  HgbA1c 6.0, goal < 7.0 CBGs SSI Recommend close follow-up with PCP for better DM control  Severe anemia Hb 8.5 -< 7.0 iron  transfusion per her outpatient protocol transfuse 1 unit red cells.   Other Stroke Risk Factors Obesity, Body mass index is 41.63 kg/m., BMI >/= 30 associated with increased stroke risk, recommend weight loss, diet and exercise as appropriate  OSA ? TIA vs. Complicated migraine in 12/2014, had holter monitor 2-3 days and was told no afib.  Other Active Problems AKI Cre 1.18--1.17   Back pain Home meds: Hydrocodone , gabapentin , celebrex Insomnia Trazadone   Hospital day # 1  Patient seen and examined by NP/APP with MD. MD to update note as needed.  Harlene Pouch, MSN, NP-C [facility] See AMION or use Epic Chat   ATTENDING NOTE: I reviewed above note and agree with the assessment and plan. Pt was seen and examined.   Husband at bedside.  Patient sitting in chair, still has slight left facial droop.  But moving all extremities.  Hemoglobin down to 7.0 today, will give 1 unit PRBC.  Patient has been having anemia and receiving outpatient IV iron  infusion.  Will also give 1 dose of IV  iron  infusion this time.  Discussed with Dr. Jacobo oncologist, will consider Eliquis  once hemoglobin improves.  Overnight A-fib RVR, resume home metoprolol  on lower dose given borderline BP.  Still has bilateral lower extremity mild edema, give Lasix  1 dose.  Continue statin.  PT and OT recommend home health.  For detailed assessment and plan, please refer to above as I have made changes wherever appropriate.   Ary Cummins, MD PhD Stroke Neurology 08/11/2024 5:33 PM  This patient is critically ill due to stroke, new diagnosed A-fib, status post thrombectomy and at significant risk of neurological  worsening, death form heart failure, recurrent stroke, hemorrhagic transformation. This patient's care requires constant monitoring of vital signs, hemodynamics, respiratory and cardiac monitoring, review of multiple databases, neurological assessment, discussion with family, other specialists and medical decision making of high complexity. I spent 40 minutes of neurocritical care time in the care of this patient. I had long discussion with husband and the patient at bedside, updated pt current condition, treatment plan and potential prognosis, and answered all the questions.  They expressed understanding and appreciation.     To contact Stroke Continuity provider, please refer to [email or web address]. After hours, contact General Neurology

## 2024-08-11 NOTE — Progress Notes (Signed)
 Occupational Therapy Treatment Patient Details Name: Margaret Frye MRN: [id number] DOB: 1946-03-01 Today's Date: 08/11/2024   History of present illness 78 yo female admits to [facility] on 08/10/24 from [place] for sudden onset of L sided weakness and numbness. TNK administered 2247 on 9/15. 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 S/p R M2 thrombectomy 9/16. PMH: DM, CA, CHF, hypothyroidism   OT comments  Pt progressing toward goals this session, more alert this session up in chair on arrival. Pt needing supervision overall for ADLs, min A for bed  mobility and CGA for transfers with RW. Pt denies dizziness and is able to ambulate household distance. Noted to have mild L inattention and still with LUE weakness. Pt presenting with impairments listed below, will follow acutely. Continue to recommend HHOT at d/c.       If plan is discharge home, recommend the following:  A little help with walking and/or transfers;A little help with bathing/dressing/bathroom   Equipment Recommendations  None recommended by OT    Recommendations for Other Services PT consult    Precautions / Restrictions Precautions Precautions: Fall Restrictions Weight Bearing Restrictions Per Provider Order: No       Mobility Bed Mobility Overal bed mobility: Needs Assistance Bed Mobility: Supine to Sit, Sit to Supine       Sit to supine: Min assist        Transfers Overall transfer level: Needs assistance Equipment used: Rolling walker (2 wheels) Transfers: Sit to/from Stand Sit to Stand: Contact guard assist                 Balance Overall balance assessment: Needs assistance Sitting-balance support: Bilateral upper extremity supported, Feet supported Sitting balance-Leahy Scale: Good     Standing balance support: Bilateral upper extremity supported, During functional activity, Reliant on assistive device for balance Standing balance-Leahy Scale: Fair                              ADL either performed or assessed with clinical judgement   ADL Overall ADL's : Needs assistance/impaired     Grooming: Oral care;Set up;Standing                   Toilet Transfer: Supervision/safety;Ambulation;Rolling walker (2 wheels)   Toileting- Clothing Manipulation and Hygiene: Supervision/safety       Functional mobility during ADLs: Supervision/safety;Rolling walker (2 wheels)      Extremity/Trunk Assessment Upper Extremity Assessment Upper Extremity Assessment: Generalized weakness LUE Deficits / Details: 4-/5 compared to RUE, coordination deficits LUE Sensation: decreased light touch;decreased proprioception LUE Coordination: decreased fine motor   Lower Extremity Assessment Lower Extremity Assessment: Defer to PT evaluation        Vision   Vision Assessment?: No apparent visual deficits   Perception Perception Perception: Impaired Preception Impairment Details: Inattention/Neglect Perception-Other Comments: mild L inattn   Praxis Praxis Praxis: Not tested   Communication Communication Communication: No apparent difficulties   Cognition Arousal: Alert Behavior During Therapy: Flat affect, WFL for tasks assessed/performed Cognition: No apparent impairments                               Following commands: Intact        Cueing   Cueing Techniques: Verbal cues, Tactile cues  Exercises      Shoulder Instructions       General  Comments VSS    Pertinent Vitals/ Pain       Pain Assessment Pain Assessment: No/denies pain  Home Living                                          Prior Functioning/Environment              Frequency  Min 2X/week        Progress Toward Goals  OT Goals(current goals can now be found in the care plan section)  Progress towards OT goals: Progressing toward goals  Acute Rehab OT Goals Patient Stated Goal: none stated OT Goal  Formulation: With patient Time For Goal Achievement: 08/24/24 Potential to Achieve Goals: Good ADL Goals Pt Will Perform Grooming: sitting;Independently Pt Will Perform Upper Body Dressing: sitting;Independently Pt Will Perform Lower Body Dressing: sitting/lateral leans;sit to/from stand;Independently Pt Will Transfer to Toilet: ambulating;regular height toilet;Independently Pt Will Perform Tub/Shower Transfer: Tub transfer;Shower transfer;ambulating;Independently  Plan      Co-evaluation                 AM-PAC OT 6 Clicks Daily Activity     Outcome Measure   Help from another person eating meals?: A Little Help from another person taking care of personal grooming?: A Little Help from another person toileting, which includes using toliet, bedpan, or urinal?: A Little Help from another person bathing (including washing, rinsing, drying)?: A Little Help from another person to put on and taking off regular upper body clothing?: A Little Help from another person to put on and taking off regular lower body clothing?: A Little 6 Click Score: 18    End of Session Equipment Utilized During Treatment: Gait belt;Rolling walker (2 wheels)  OT Visit Diagnosis: Unsteadiness on feet (R26.81);Other abnormalities of gait and mobility (R26.89);Muscle weakness (generalized) (M62.81)   Activity Tolerance Patient tolerated treatment well   Patient Left in bed;with call bell/phone within reach;with bed alarm set;with family/visitor present;with nursing/sitter in room   Nurse Communication Mobility status        Time: 8883-8866 OT Time Calculation (min): 17 min  Charges: OT General Charges $OT Visit: 1 Visit OT Treatments $Therapeutic Activity: 8-22 mins  Margaret Frye, OTD, OTR/L SecureChat Preferred Acute Rehab [phone]   Margaret Frye 08/11/2024, 12:33 PM

## 2024-08-12 ENCOUNTER — Ambulatory Visit

## 2024-08-12 ENCOUNTER — Encounter (HOSPITAL_COMMUNITY): Payer: Self-pay

## 2024-08-12 LAB — TYPE AND SCREEN
ABO/RH(D): A POS
Antibody Screen: NEGATIVE
Unit division: 0

## 2024-08-12 LAB — CBC
HCT: 29.2 % — ABNORMAL LOW (ref 36.0–46.0)
Hemoglobin: 8.9 g/dL — ABNORMAL LOW (ref 12.0–15.0)
MCH: 27.3 pg (ref 26.0–34.0)
MCHC: 30.5 g/dL (ref 30.0–36.0)
MCV: 89.6 fL (ref 80.0–100.0)
Platelets: 160 K/uL (ref 150–400)
RBC: 3.26 MIL/uL — ABNORMAL LOW (ref 3.87–5.11)
RDW: 22.4 % — ABNORMAL HIGH (ref 11.5–15.5)
WBC: 6.2 K/uL (ref 4.0–10.5)
nRBC: 1.1 % — ABNORMAL HIGH (ref 0.0–0.2)

## 2024-08-12 LAB — BASIC METABOLIC PANEL WITH GFR
Anion gap: 16 — ABNORMAL HIGH (ref 5–15)
BUN: 9 mg/dL (ref 8–23)
CO2: 28 mmol/L (ref 22–32)
Calcium: 9.1 mg/dL (ref 8.9–10.3)
Chloride: 99 mmol/L (ref 98–111)
Creatinine, Ser: 1.01 mg/dL — ABNORMAL HIGH (ref 0.44–1.00)
GFR, Estimated: 58 mL/min — ABNORMAL LOW (ref >60)
Glucose, Bld: 141 mg/dL — ABNORMAL HIGH (ref 70–99)
Potassium: 3.7 mmol/L (ref 3.5–5.1)
Sodium: 143 mmol/L (ref 135–145)

## 2024-08-12 LAB — HEMOGLOBIN AND HEMATOCRIT, BLOOD
HCT: 31.2 % — ABNORMAL LOW (ref 36.0–46.0)
Hemoglobin: 9.5 g/dL — ABNORMAL LOW (ref 12.0–15.0)

## 2024-08-12 LAB — GLUCOSE, CAPILLARY
Glucose-Capillary: 191 mg/dL — ABNORMAL HIGH (ref 70–99)
Glucose-Capillary: 185 mg/dL — ABNORMAL HIGH (ref 70–99)

## 2024-08-12 LAB — BPAM RBC
Blood Product Expiration Date: 202510122359
ISSUE DATE / TIME: 202509171346
Unit Type and Rh: 6200

## 2024-08-12 LAB — LEGIONELLA PNEUMOPHILA SEROGP 1 UR AG: L. pneumophila Serogp 1 Ur Ag: NEGATIVE

## 2024-08-12 LAB — MAGNESIUM: Magnesium: 1.9 mg/dL (ref 1.7–2.4)

## 2024-08-12 MED ORDER — APIXABAN 5 MG PO TABS
5.0000 mg | ORAL_TABLET | Freq: Two times a day (BID) | ORAL | Status: DC
  Administered 2024-08-12: 5 mg via ORAL
  Filled 2024-08-12: qty 1

## 2024-08-12 MED ORDER — METOPROLOL SUCCINATE ER 25 MG PO TB24: 75.0000 mg | ORAL_TABLET | Freq: Two times a day (BID) | ORAL | 0 refills | Status: AC

## 2024-08-12 MED ORDER — FUROSEMIDE 20 MG PO TABS
20.0000 mg | ORAL_TABLET | Freq: Every day | ORAL | Status: DC
  Administered 2024-08-12: 20 mg via ORAL
  Filled 2024-08-12: qty 1

## 2024-08-12 MED ORDER — METOPROLOL SUCCINATE ER 50 MG PO TB24: 75.0000 mg | ORAL_TABLET | Freq: Two times a day (BID) | ORAL | Status: DC

## 2024-08-12 MED ORDER — APIXABAN 5 MG PO TABS: 5.0000 mg | ORAL_TABLET | Freq: Two times a day (BID) | ORAL | 0 refills | Status: AC

## 2024-08-12 MED ORDER — FUROSEMIDE 20 MG PO TABS: 20.0000 mg | ORAL_TABLET | Freq: Every day | ORAL | 0 refills | Status: AC

## 2024-08-12 MED ORDER — LEVOTHYROXINE SODIUM 75 MCG PO TABS: 75.0000 ug | ORAL_TABLET | Freq: Every day | ORAL | 0 refills | Status: AC

## 2024-08-12 MED ORDER — ISOSORBIDE MONONITRATE ER 30 MG PO TB24
60.0000 mg | ORAL_TABLET | Freq: Every day | ORAL | Status: DC
  Administered 2024-08-12: 60 mg via ORAL
  Filled 2024-08-12: qty 2

## 2024-08-12 MED ORDER — ACETAMINOPHEN 325 MG PO TABS: 650.0000 mg | ORAL_TABLET | ORAL | 0 refills | Status: AC | PRN

## 2024-08-12 NOTE — TOC Transition Note (Signed)
 Transition of Care [facility]) - Discharge Note   Patient Details  Name: Margaret Frye MRN: [id number] Date of Birth: 1946-07-01  Transition of Care [facility] CM/SW Contact:  Emon Lance M, RN Phone Number: 08/12/2024, 12:03 PM   Clinical Narrative:    78 yo female admits to [facility] on 08/10/24 from [place] for sudden onset of L sided weakness and numbness. TNK administered 2247 on 9/15. 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 S/p R M2 thrombectomy 9/16.    Final next level of care: Home w Home Health Services Barriers to Discharge: Barriers Resolved   Patient Goals and CMS Choice Patient states their goals for this hospitalization and ongoing recovery are:: to go home CMS Medicare.gov Compare Post Acute Care list provided to:: Patient Choice offered to / list presented to : Patient      Discharge Placement                       Discharge Plan and Services Additional resources added to the After Visit Summary for     Discharge Planning Services: CM Consult Post Acute Care Choice: Home Health                    HH Arranged: PT, OT [facility] Agency: [facility] Date [facility] Agency Contacted: 08/12/24 Time HH Agency Contacted: [id number] Representative spoke with at [facility] Agency: Amy Hyatt  Social Drivers of Health (SDOH) Interventions SDOH Screenings   Food Insecurity: No Food Insecurity (08/10/2024)  Housing: Low Risk  (08/10/2024)  Transportation Needs: No Transportation Needs (08/10/2024)  Utilities: Not At Risk (08/10/2024)  Alcohol  Screen: Low Risk  (07/28/2024)  Depression (PHQ2-9): Low Risk  (07/28/2024)  Social Connections: Socially Integrated (08/10/2024)  Tobacco Use: Low Risk  (08/10/2024)     Readmission Risk Interventions     No data to display

## 2024-08-12 NOTE — Progress Notes (Signed)
   08/12/24 1400  Vitals  BP 118/72  MAP (mmHg) 88  BP Location Right Arm  BP Method Automatic  Patient Position (if appropriate) Sitting  Pulse Rate 88  Pulse Rate Source Monitor  ECG Heart Rate 85  Resp 16   Pt discharged home via private vehicle. Discharge instructions reviewed with pt and her daughter. Both parties verbalized understanding. All PIVs removed, vital signs WDL, and exam is stable at this time.   08/12/2024 Charletta Bathe, RN, BSN 2:00 PM

## 2024-08-12 NOTE — Discharge Summary (Addendum)
 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 A-fib, new diagnosis   Secondary diagnosis A-fib, new diagnosis HFrEF Hypertension Hyperlipidemia Diabetes Severe anemia Obesity OSA AKI   Allergies as of 08/12/2024   No Known Allergies      Medication List     STOP taking these medications    aspirin  81 MG chewable tablet   AZO TABS PO   celecoxib 200 MG capsule Commonly known as: CELEBREX   hydrochlorothiazide  25 MG tablet Commonly known as: HYDRODIURIL    HYDROcodone -acetaminophen  10-325 MG tablet Commonly known as: NORCO   isosorbide  mononitrate 60 MG 24 hr tablet Commonly known as: IMDUR    ondansetron  4 MG tablet Commonly known as: ZOFRAN    predniSONE  10 MG tablet Commonly known as: DELTASONE        TAKE these medications    acetaminophen  325 MG tablet Commonly known as: TYLENOL  Take 2 tablets (650 mg total) by mouth every 4 (four) hours as needed for up to 3 days for mild pain (pain score 1-3) or fever (or temp > 37.5 C (99.5 F)).   albuterol  108 (90 Base) MCG/ACT inhaler Commonly known as: VENTOLIN  HFA Inhale 2 puffs into the lungs every 6 (six) hours as needed for wheezing.   apixaban  5 MG Tabs tablet Commonly known as: ELIQUIS  Take 1 tablet (5 mg total) by mouth 2 (two) times daily.   cyanocobalamin  1000 MCG/ML injection Commonly known as: VITAMIN B12 Inject 1,000 mcg into the muscle every 30 (thirty) days.   docusate sodium  100 MG capsule Commonly known as: COLACE Take 100 mg by mouth daily.   empagliflozin 10 MG Tabs tablet Commonly known as:  JARDIANCE Take 10 mg by mouth daily.   ergocalciferol  1.25 MG (50000 UT) capsule Commonly known as: VITAMIN D2 Take 50,000 Units by mouth every Sunday.   fluticasone  50 MCG/ACT nasal spray Commonly known as: FLONASE  Place 1-2 sprays into both nostrils daily as needed. For nasal congestion.   furosemide  20 MG tablet Commonly known as: LASIX  Take 1 tablet (20 mg total) by mouth daily. Start taking on: August 13, 2024 What changed:  medication strength how much to take when to take this   gabapentin  300 MG capsule Commonly known as: NEURONTIN  Take 300 mg by mouth 4 (four) times daily.   latanoprost  0.005 % ophthalmic solution Commonly known as: XALATAN  Place 1 drop into both eyes at bedtime.   levocetirizine 5 MG tablet Commonly known as: XYZAL  Take 5 mg by mouth at bedtime.   levothyroxine  75 MCG tablet Commonly known as: SYNTHROID  Take 1 tablet (75 mcg total) by mouth daily at 6 (six) AM. Start taking on: August 13, 2024 What changed:  medication strength when to take this   metFORMIN  500 MG tablet Commonly known as: GLUCOPHAGE  Take 1,000 mg by mouth 2 (two) times daily.   metoprolol  succinate 25 MG 24 hr tablet Commonly known as: TOPROL -XL Take 3 tablets (75 mg total) by mouth 2 (two) times daily. Take with or immediately following a meal. What changed:  medication strength how much to take additional instructions Another  medication with the same name was removed. Continue taking this medication, and follow the directions you see here.   omeprazole 40 MG capsule Commonly known as: PRILOSEC Take 40 mg by mouth daily before breakfast.   simvastatin  40 MG tablet Commonly known as: ZOCOR  Take 40 mg by mouth at bedtime.   traZODone  100 MG tablet Commonly known as: DESYREL  Take 100 mg by mouth at bedtime.   triamcinolone  cream 0.1 % Commonly known as: KENALOG  Apply 1 application topically 2 (two) times daily as needed (for psoriasis (typically once  daily)).        LABORATORY STUDIES CBC    Component Value Date/Time   WBC 6.2 08/12/2024 0554   RBC 3.26 (L) 08/12/2024 0554   HGB 8.9 (L) 08/12/2024 0554   HGB 12.2 12/20/2014 1447   HCT 29.2 (L) 08/12/2024 0554   HCT 37.3 12/20/2014 1447   PLT 160 08/12/2024 0554   PLT 230 12/20/2014 1447   MCV 89.6 08/12/2024 0554   MCV 93 12/20/2014 1447   MCH 27.3 08/12/2024 0554   MCHC 30.5 08/12/2024 0554   RDW 22.4 (H) 08/12/2024 0554   RDW 14.5 12/20/2014 1447   LYMPHSABS 1.4 08/09/2024 2333   LYMPHSABS 2.4 12/20/2014 1447   MONOABS 0.5 08/09/2024 2333   MONOABS 0.4 12/20/2014 1447   EOSABS 0.3 08/09/2024 2333   EOSABS 0.2 12/20/2014 1447   BASOSABS 0.1 08/09/2024 2333   BASOSABS 0.0 12/20/2014 1447   CMP    Component Value Date/Time   NA 143 08/12/2024 0554   NA 141 12/20/2014 1447   K 3.7 08/12/2024 0554   K 3.5 12/20/2014 1447   CL 99 08/12/2024 0554   CL 106 12/20/2014 1447   CO2 28 08/12/2024 0554   CO2 28 12/20/2014 1447   GLUCOSE 141 (H) 08/12/2024 0554   GLUCOSE 134 (H) 12/20/2014 1447   BUN 9 08/12/2024 0554   BUN 11 12/20/2014 1447   CREATININE 1.01 (H) 08/12/2024 0554   CREATININE 0.77 12/20/2014 1447   CALCIUM 9.1 08/12/2024 0554   CALCIUM 9.2 12/20/2014 1447   PROT 6.1 (L) 08/09/2024 2333   PROT 7.7 12/20/2014 1447   ALBUMIN  3.1 (L) 08/09/2024 2333   ALBUMIN  3.9 12/20/2014 1447   AST 21 08/09/2024 2333   AST 66 (H) 12/20/2014 1447   ALT 9 08/09/2024 2333   ALT 56 12/20/2014 1447   ALKPHOS 43 08/09/2024 2333   ALKPHOS 68 12/20/2014 1447   BILITOT 0.9 08/09/2024 2333   BILITOT 0.5 12/20/2014 1447   GFRNONAA 58 (L) 08/12/2024 0554   GFRNONAA >60 12/20/2014 1447   GFRAA >60 06/05/2017 0855   GFRAA >60 12/20/2014 1447   COAGS Lab Results  Component Value Date   INR 1.1 08/09/2024   INR 1.0 12/20/2014   Lipid Panel    Component Value Date/Time   CHOL 90 08/10/2024 0611   TRIG 171 (H) 08/10/2024 0611   HDL 32 (L) 08/10/2024 0611   CHOLHDL  2.8 08/10/2024 0611   VLDL 34 08/10/2024 0611   LDLCALC 24 08/10/2024 0611   HgbA1C  Lab Results  Component Value Date   HGBA1C 6.0 (H) 08/09/2024   Urinalysis    Component Value Date/Time   COLORURINE YELLOW (A) 09/23/2021 1545   APPEARANCEUR Clear 01/07/2022 1054   LABSPEC 1.012 09/23/2021 1545   LABSPEC 1.003 12/20/2014 1433   PHURINE 5.0 09/23/2021 1545   GLUCOSEU Negative 01/07/2022 1054   GLUCOSEU Negative 12/20/2014 1433   HGBUR NEGATIVE 09/23/2021 1545  BILIRUBINUR Negative 01/07/2022 1054   BILIRUBINUR Negative 12/20/2014 1433   KETONESUR NEGATIVE 09/23/2021 1545   PROTEINUR Negative 01/07/2022 1054   PROTEINUR NEGATIVE 09/23/2021 1545   NITRITE Negative 01/07/2022 1054   NITRITE NEGATIVE 09/23/2021 1545   LEUKOCYTESUR Negative 01/07/2022 1054   LEUKOCYTESUR NEGATIVE 09/23/2021 1545   LEUKOCYTESUR Negative 12/20/2014 1433   Urine Drug Screen No results found for: LABOPIA, COCAINSCRNUR, LABBENZ, AMPHETMU, THCU, LABBARB  Alcohol  Level    Component Value Date/Time   [facility] <15 08/09/2024 2333     SIGNIFICANT DIAGNOSTIC STUDIES MR ANGIO HEAD WO CONTRAST Result Date: 08/11/2024 EXAM: MR Angiography Head without intravenous Contrast. 08/10/2024 10:13:33 PM TECHNIQUE: Magnetic resonance angiography images of the head without intravenous contrast. Multiplanar 2D and 3D reformatted images are provided for review. COMPARISON: CT angiogram of the head dated 08/09/2024. CLINICAL HISTORY: Stroke, follow up. FINDINGS: ANTERIOR CIRCULATION: Moderate stenosis of a proximal M2 branch of the right middle cerebral artery. There is no focal occlusion clearly demonstrated. POSTERIOR CIRCULATION: Fetal origin of the right posterior cerebral artery and fetal type origin of the left posterior cerebral artery, with a relatively diminutive vertebral basilar system, normal variant. The left P1 segment is hypoplastic. IMPRESSION: 1. Moderate stenosis of a proximal M2 branch of the  right middle cerebral artery. 2. Fetal origin of the right posterior cerebral artery and fetal type origin of the left posterior cerebral artery, with a relatively diminutive vertebral basilar system, normal variant. The left P1 segment is hypoplastic. Electronically signed by: Evalene Coho MD 08/11/2024 04:20 AM EDT RP Workstation: [id number]   MR BRAIN WO CONTRAST Result Date: 08/11/2024 EXAM: MRI BRAIN WITHOUT CONTRAST 08/10/2024 10:13:15 PM TECHNIQUE: Multiplanar multisequence MRI of the head/brain was performed without the administration of intravenous contrast. COMPARISON: CT of the head dated 08/09/2024. CLINICAL HISTORY: Stroke, follow up. FINDINGS: BRAIN AND VENTRICLES: No acute infarct. No intracranial hemorrhage. No mass. No midline shift. No hydrocephalus. The sella is unremarkable. Normal flow voids. Age-related atrophy and moderately advanced diffuse cerebral white matter disease. ORBITS: The patient is status post bilateral cataracts surgery. SINUSES AND MASTOIDS: No acute abnormality. BONES AND SOFT TISSUES: Normal marrow signal. No acute soft tissue abnormality. IMPRESSION: 1. No acute intracranial abnormality. 2. Age-related atrophy and moderately advanced diffuse cerebral white matter disease. Electronically signed by: Evalene Coho MD 08/11/2024 04:02 AM EDT RP Workstation: [id number]   ECHOCARDIOGRAM COMPLETE Result Date: 08/10/2024    ECHOCARDIOGRAM REPORT   Patient Name:   Margaret Frye Date of Exam: 08/10/2024 Medical Rec #:  [id number]   Height:       64.0 in Accession #:    [id number]  Weight:       242.5 lb Date of Birth:  10/20/1946   BSA:          2.123 m Patient Age:    78 years    BP:           109/53 mmHg Patient Gender: F           HR:           68 bpm. Exam Location:  Inpatient Procedure: 2D Echo and Intracardiac Opacification Agent (Both Spectral and Color            Flow Doppler were utilized during procedure). Indications:    CVA  History:        Patient has no prior  history of Echocardiogram examinations.                 Risk  Factors:Hypertension.  Sonographer:    Charmaine Gaskins Referring Phys: [id number] Lajuan Godbee IMPRESSIONS  1. Left ventricular ejection fraction, by estimation, is 60 to 65%. The left ventricle has normal function. The left ventricle has no regional wall motion abnormalities. Left ventricular diastolic function could not be evaluated.  2. Right ventricular systolic function is normal. The right ventricular size is normal.  3. Left atrial size was mild to moderately dilated.  4. The mitral valve is normal in structure. Mild mitral valve regurgitation. No evidence of mitral stenosis.  5. The aortic valve is normal in structure. Aortic valve regurgitation is trivial. No aortic stenosis is present.  6. The inferior vena cava is normal in size with greater than 50% respiratory variability, suggesting right atrial pressure of 3 mmHg. FINDINGS  Left Ventricle: Left ventricular ejection fraction, by estimation, is 60 to 65%. The left ventricle has normal function. The left ventricle has no regional wall motion abnormalities. The left ventricular internal cavity size was normal in size. There is  no left ventricular hypertrophy. Left ventricular diastolic function could not be evaluated due to atrial fibrillation. Left ventricular diastolic function could not be evaluated. Right Ventricle: The right ventricular size is normal. No increase in right ventricular wall thickness. Right ventricular systolic function is normal. Left Atrium: Left atrial size was mild to moderately dilated. Right Atrium: Right atrial size was normal in size. Pericardium: There is no evidence of pericardial effusion. Mitral Valve: The mitral valve is normal in structure. Mild mitral annular calcification. Mild mitral valve regurgitation. No evidence of mitral valve stenosis. Tricuspid Valve: The tricuspid valve is normal in structure. Tricuspid valve regurgitation is not demonstrated. No evidence  of tricuspid stenosis. Aortic Valve: The aortic valve is normal in structure. There is mild to moderate aortic valve annular calcification. Aortic valve regurgitation is trivial. No aortic stenosis is present. Pulmonic Valve: The pulmonic valve was normal in structure. Pulmonic valve regurgitation is not visualized. No evidence of pulmonic stenosis. Aorta: The aortic root is normal in size and structure. Venous: The inferior vena cava is normal in size with greater than 50% respiratory variability, suggesting right atrial pressure of 3 mmHg. IAS/Shunts: No atrial level shunt detected by color flow Doppler.  LEFT VENTRICLE PLAX 2D LVIDd:         4.80 cm     Diastology LVIDs:         2.60 cm     LV e' medial:    7.54 cm/s LV PW:         1.10 cm     LV E/e' medial:  19.1 LV IVS:        1.00 cm     LV e' lateral:   10.25 cm/s LVOT diam:     1.90 cm     LV E/e' lateral: 14.1 LVOT Area:     2.84 cm  LV Volumes (MOD) LV vol d, MOD A2C: 76.1 ml LV vol d, MOD A4C: 79.5 ml LV vol s, MOD A2C: 21.3 ml LV vol s, MOD A4C: 21.6 ml LV SV MOD A2C:     54.8 ml LV SV MOD A4C:     79.5 ml LV SV MOD BP:      55.9 ml RIGHT VENTRICLE RV Basal diam:  3.60 cm RV Mid diam:    3.30 cm RV S prime:     15.20 cm/s LEFT ATRIUM             Index  RIGHT ATRIUM           Index LA diam:        4.10 cm 1.93 cm/m   RA Area:     12.00 cm LA Vol (A2C):   98.0 ml 46.15 ml/m  RA Volume:   27.30 ml  12.86 ml/m LA Vol (A4C):   87.7 ml 41.30 ml/m LA Biplane Vol: 95.5 ml 44.98 ml/m   AORTA Ao Asc diam: 3.30 cm MITRAL VALVE MV Area (PHT): 3.65 cm     SHUNTS MV Decel Time: 208 msec     Systemic Diam: 1.90 cm MV E velocity: 144.33 cm/s Aditya Sabharwal Electronically signed by Ria Commander Signature Date/Time: 08/10/2024/4:28:01 PM    Final    DG CHEST PORT 1 VIEW Result Date: 08/10/2024 CLINICAL DATA:  Pneumonia EXAM: PORTABLE CHEST 1 VIEW COMPARISON:  None Available. FINDINGS: Mild cardiomegaly. Mediastinal contours within normal limits.  Right lung clear. Airspace opacity in the left retrocardiac region concerning for infiltrate/pneumonia. No visible effusions or acute bony abnormality. IMPRESSION: Left lower lobe/retrocardiac density could reflect atelectasis or pneumonia. Electronically Signed   By: Franky Crease M.D.   On: 08/10/2024 12:06   IR PERCUTANEOUS ART THROMBECTOMY/INFUSION INTRACRANIAL INC DIAG ANGIO Result Date: 08/10/2024 PROCEDURE PERFORMED: 1. Cerebral angiography with stroke thrombectomy 2. Ultrasound guided vascular access 3. Cone beam CT for treatment planning COMPARISON:  CT angiogram of the head and neck performed August 09, 2024 CLINICAL DATA:  78 year old female with acute ischemic stroke with symptoms of aphasia and left-sided weakness. Patient was a candidate for TNK which was administered. After multidisciplinary review, the patient was offered stroke thrombectomy. INDICATION: Acute ischemic stroke. ANESTHESIA/SEDATION: General anesthesia was utilized for the procedure. CONTRAST:  Approximately 50 cc Ominipque 300 MEDICATIONS: See MAR FLUOROSCOPY TIME:  Fluoroscopy Time: 6.5 minutes, (1009 mGy). COMPLICATIONS: None immediate. BODY OF REPORT: Following a full explanation of the procedure along with the potential associated complications, an informed witnessed consent was obtained. The patient was then placed under general anesthesia by the Department of Anesthesiology at [facility]. The right femoral access site was prepped and draped in the usual sterile fashion. Ultrasound was used to study the right common femoral artery which was patent. Using real-time ultrasound guidance, a 19 gauge introducer needle was used to access the right common femoral artery. Access was performed at 02:40 a.m. A hard copy image ultrasound the saved and stored in PACS. Using this access, a 6 French sheath was placed in the descending thoracic aorta. Next, selective catheterization the right internal carotid artery was performed.  A selective arteriogram was performed which demonstrated a proximal right M2 occlusion. Pretreatment TICI score 1. Next, a penumbra 43 and red 62 catheter was used to selectively catheterize the right M2 horizontal segment. The first pass was performed at 0254. Following thrombectomy, recanalization of the occluded right M2 segment was achieved without evidence of residual thromboembolic disease. There was no evidence of extravasation. Post treatment TICI score 3. After reviewing the imaging, I elected to terminate the procedure at this point. Evaluation of the right femoral access site demonstrated that the site was suitable for a closure device. A 6 French Angio-Seal device was deployed without complication. Cone beam CT was then performed to evaluate for intracranial hemorrhage and treatment planning. This demonstrated no evidence of significant acute intracranial hemorrhage. The patient was removed from anesthesia and transferred to recovery in stable condition. IMPRESSION: 1. Suction thrombectomy of right M2 occlusion. PLAN: 1. To ICU for routine postoperative  supportive care. Electronically Signed   By: Maude Naegeli M.D.   On: 08/10/2024 03:45   IR US  Guide Vasc Access Right Result Date: 08/10/2024 PROCEDURE PERFORMED: 1. Cerebral angiography with stroke thrombectomy 2. Ultrasound guided vascular access 3. Cone beam CT for treatment planning COMPARISON:  CT angiogram of the head and neck performed August 09, 2024 CLINICAL DATA:  78 year old female with acute ischemic stroke with symptoms of aphasia and left-sided weakness. Patient was a candidate for TNK which was administered. After multidisciplinary review, the patient was offered stroke thrombectomy. INDICATION: Acute ischemic stroke. ANESTHESIA/SEDATION: General anesthesia was utilized for the procedure. CONTRAST:  Approximately 50 cc Ominipque 300 MEDICATIONS: See MAR FLUOROSCOPY TIME:  Fluoroscopy Time: 6.5 minutes, (1009 mGy). COMPLICATIONS: None  immediate. BODY OF REPORT: Following a full explanation of the procedure along with the potential associated complications, an informed witnessed consent was obtained. The patient was then placed under general anesthesia by the Department of Anesthesiology at [facility] The right femoral access site was prepped and draped in the usual sterile fashion. Ultrasound was used to study the right common femoral artery which was patent. Using real-time ultrasound guidance, a 19 gauge introducer needle was used to access the right common femoral artery. Access was performed at 02:40 a.m. A hard copy image ultrasound the saved and stored in PACS. Using this access, a 6 French sheath was placed in the descending thoracic aorta. Next, selective catheterization the right internal carotid artery was performed. A selective arteriogram was performed which demonstrated a proximal right M2 occlusion. Pretreatment TICI score 1. Next, a penumbra 43 and red 62 catheter was used to selectively catheterize the right M2 horizontal segment. The first pass was performed at 0254. Following thrombectomy, recanalization of the occluded right M2 segment was achieved without evidence of residual thromboembolic disease. There was no evidence of extravasation. Post treatment TICI score 3. After reviewing the imaging, I elected to terminate the procedure at this point. Evaluation of the right femoral access site demonstrated that the site was suitable for a closure device. A 6 French Angio-Seal device was deployed without complication. Cone beam CT was then performed to evaluate for intracranial hemorrhage and treatment planning. This demonstrated no evidence of significant acute intracranial hemorrhage. The patient was removed from anesthesia and transferred to recovery in stable condition. IMPRESSION: 1. Suction thrombectomy of right M2 occlusion. PLAN: 1. To ICU for routine postoperative supportive care. Electronically Signed   By:  Maude Naegeli M.D.   On: 08/10/2024 03:45   IR CT Head Ltd Result Date: 08/10/2024 PROCEDURE PERFORMED: 1. Cerebral angiography with stroke thrombectomy 2. Ultrasound guided vascular access 3. Cone beam CT for treatment planning COMPARISON:  CT angiogram of the head and neck performed August 09, 2024 CLINICAL DATA:  78 year old female with acute ischemic stroke with symptoms of aphasia and left-sided weakness. Patient was a candidate for TNK which was administered. After multidisciplinary review, the patient was offered stroke thrombectomy. INDICATION: Acute ischemic stroke. ANESTHESIA/SEDATION: General anesthesia was utilized for the procedure. CONTRAST:  Approximately 50 cc Ominipque 300 MEDICATIONS: See MAR FLUOROSCOPY TIME:  Fluoroscopy Time: 6.5 minutes, (1009 mGy). COMPLICATIONS: None immediate. BODY OF REPORT: Following a full explanation of the procedure along with the potential associated complications, an informed witnessed consent was obtained. The patient was then placed under general anesthesia by the Department of Anesthesiology at [facility]. The right femoral access site was prepped and draped in the usual sterile fashion. Ultrasound was used to study the right  common femoral artery which was patent. Using real-time ultrasound guidance, a 19 gauge introducer needle was used to access the right common femoral artery. Access was performed at 02:40 a.m. A hard copy image ultrasound the saved and stored in PACS. Using this access, a 6 French sheath was placed in the descending thoracic aorta. Next, selective catheterization the right internal carotid artery was performed. A selective arteriogram was performed which demonstrated a proximal right M2 occlusion. Pretreatment TICI score 1. Next, a penumbra 43 and red 62 catheter was used to selectively catheterize the right M2 horizontal segment. The first pass was performed at 0254. Following thrombectomy, recanalization of the occluded right  M2 segment was achieved without evidence of residual thromboembolic disease. There was no evidence of extravasation. Post treatment TICI score 3. After reviewing the imaging, I elected to terminate the procedure at this point. Evaluation of the right femoral access site demonstrated that the site was suitable for a closure device. A 6 French Angio-Seal device was deployed without complication. Cone beam CT was then performed to evaluate for intracranial hemorrhage and treatment planning. This demonstrated no evidence of significant acute intracranial hemorrhage. The patient was removed from anesthesia and transferred to recovery in stable condition. IMPRESSION: 1. Suction thrombectomy of right M2 occlusion. PLAN: 1. To ICU for routine postoperative supportive care. Electronically Signed   By: Maude Naegeli M.D.   On: 08/10/2024 03:45   CT CEREBRAL PERFUSION W CONTRAST Result Date: 08/10/2024 CLINICAL DATA:  Acute stroke due to ischemia [facility]) EXAM: CT PERFUSION BRAIN TECHNIQUE: Multiphase CT imaging of the brain was performed following IV bolus contrast injection. Subsequent parametric perfusion maps were calculated using RAPID software. RADIATION DOSE REDUCTION: This exam was performed according to the departmental dose-optimization program which includes automated exposure control, adjustment of the mA and/or kV according to patient size and/or use of iterative reconstruction technique. CONTRAST:  40mL OMNIPAQUE  IOHEXOL  350 MG/ML SOLN COMPARISON:  Same day CT head and CTA head/neck. FINDINGS: CT Brain Perfusion Findings: CBF (<30%) Volume: 0mL Perfusion (Tmax>6.0s) volume: 74mL Mismatch Volume: 74mL ASPECTS on noncontrast CT Head: 10 earlier today. Infarct Core: 0 mL Infarction Location:No core infarct identified. The mismatch is in the right MCA territory. IMPRESSION: RAPID reports 74 mL of mismatch/penumbra in the right MCA territory. No core infarct identified. Electronically Signed   By: Gilmore GORMAN Molt  M.D.   On: 08/10/2024 00:21   CT ANGIO HEAD NECK W WO CM (CODE STROKE) Addendum Date: 08/10/2024 ADDENDUM REPORT: 08/10/2024 00:08 ADDENDUM: Findings discussed with Dr. Nicholaus via telephone at 11:50 p.m. Electronically Signed   By: Gilmore GORMAN Molt M.D.   On: 08/10/2024 00:08   Result Date: 08/10/2024 CLINICAL DATA:  Neuro deficit, acute, stroke suspected stroke eval, exclude lvo EXAM: CT ANGIOGRAPHY HEAD AND NECK WITH AND WITHOUT CONTRAST TECHNIQUE: Multidetector CT imaging of the head and neck was performed using the standard protocol during bolus administration of intravenous contrast. Multiplanar CT image reconstructions and MIPs were obtained to evaluate the vascular anatomy. Carotid stenosis measurements (when applicable) are obtained utilizing NASCET criteria, using the distal internal carotid diameter as the denominator. RADIATION DOSE REDUCTION: This exam was performed according to the departmental dose-optimization program which includes automated exposure control, adjustment of the mA and/or kV according to patient size and/or use of iterative reconstruction technique. CONTRAST:  75mL OMNIPAQUE  IOHEXOL  350 MG/ML SOLN COMPARISON:  Same day CT head. FINDINGS: CTA NECK FINDINGS Aortic arch: Great vessel origins are patent without significant stenosis. Aortic atherosclerosis. Right carotid system:  Atherosclerosis at the carotid bifurcation without greater than 50% stenosis. Left carotid system: Atherosclerosis at the carotid bifurcation without greater than 50% stenosis. Vertebral arteries: Codominant. No evidence of dissection, stenosis (50% or greater), or occlusion. Skeleton: No evidence of acute abnormality on limited assessment. C5-C7 solid ACDF Other neck: No acute abnormality on limited assessment. Upper chest: Patchy consolidation and right greater than left upper lobes with interstitial lobular thickening Review of the MIP images confirms the above findings CTA HEAD FINDINGS Anterior  circulation: Bilateral intracranial ICAs are patent without significant stenosis. The left MCA and bilateral ACAs are patent without proximal hemodynamically significant stenosis. The right M1 MCA is patent. There is an occluded proximal right M2 MCA with mid M2 MCA reconstitution. Posterior circulation: Bilateral intradural vertebral arteries, basilar artery and bilateral posterior cerebral arteries are patent without proximal hemodynamically significant stenosis. Fetal type PCAs with small vertebrobasilar system, anatomic variant. Venous sinuses: As permitted by contrast timing, patent. Review of the MIP images confirms the above findings Other: Attempting to contact the provider at the time of dictation for call of report. IMPRESSION: 1. 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 iss 2. Patchy upper lobe consolidation concerning for aspiration and/or pneumonia. 3. Suspected mild interstitial pulmonary edema. Electronically Signed: By: Gilmore GORMAN Molt M.D. On: 08/09/2024 23:41   CT HEAD CODE STROKE WO CONTRAST Result Date: 08/09/2024 CLINICAL DATA:  Code stroke.  Neuro deficit, acute, stroke suspected EXAM: CT HEAD WITHOUT CONTRAST TECHNIQUE: Contiguous axial images were obtained from the base of the skull through the vertex without intravenous contrast. RADIATION DOSE REDUCTION: This exam was performed according to the departmental dose-optimization program which includes automated exposure control, adjustment of the mA and/or kV according to patient size and/or use of iterative reconstruction technique. COMPARISON:  CT head 12/20/2014. FINDINGS: Brain: No evidence of acute infarction, hemorrhage, hydrocephalus, extra-axial collection or mass lesion/mass effect. Patchy white matter hypodensities are compatible with chronic microvascular disease. Vascular: No hyperdense vessel. Skull: No acute fracture. Sinuses/Orbits: Clear sinuses.  No acute orbital findings. Other: No mastoid effusions  ASPECTS [facility] Stroke Program Early CT Score) Total score (0-10 with 10 being normal): 10. Other: Attempted to call report at the time of dictation. IMPRESSION: 1. No evidence of acute intracranial abnormality. ASPECTS is 10. 2. Chronic microvascular ischemic disease. Code stroke imaging results were communicated on 08/09/2024 at 10:40 pm to provider Dr. Dicky via telephone. Electronically Signed   By: Gilmore GORMAN Molt M.D.   On: 08/09/2024 22:41      HISTORY OF PRESENT ILLNESS BRITAIN ANAGNOS is a 78 y.o. female with an extensive PMHx as documented below (reviewed) who initially presented to the [facility] with acute stroke symptoms of left sided weakness, left facial droop and dysarthria. NIHSS was 5 initially. CT head revealed no ICH. She was evaluated by Teleneurology and was determined to be a candidate for TNK. On repeat exam by CCM after TNK her NIHSS was 7. CTA revealed a right M2 occlusion. She was then sent emergently to [facility] for thrombectomy after informed consent was obtained over the telephone from family, with Dr. Ray, Dr. Kathrene and myself on the call.    HOSPITAL COURSE Ms. Margaret Frye is a 78 y.o. female with history of IDA, Anxiety and Depression, HTN, HLD, Hypothyroidism, OSA, T2DM, HFpEF, PVD, PVCs, TIA who initially presented as a code stroke to [facility]. She received TNK and was transferred to [facility] for a mechanical thrombectomy.  NIH on Admission 5. CTA Head and  neck with a right M2 MCA occlusion. Patient became hypotensive and NIH reportedly worsened to 12 and then 22. NIH on arrival to [facility] 10.   Stroke:  right insular cortex and left frontal small infarcts s/p TNK and IR with TICI3, etiology: embolic in the setting of A-fib, new diagnosis Code Stroke CT head No acute abnormality. ASPECTS 10.  CTA head & neck Right proximal M2 MCA occlusion with mid M2 MCA reconstitution.  CT perfusion 0/74cc S/p IR with TICI3 reperfusion MRI right insular cortex and left frontal small infarcts on my read MRA  moderate stenosis of proximal R M2, hypoplastic posterior circulation, bilateral fetal PCAs 2D Echo EF 60-65% no shunts LDL 24 HgbA1c 6.0 VTE prophylaxis - SCDs aspirin  81 mg daily prior to admission, now on eliquis  after improved Hb at discharge.  Continue Eliquis . Therapy recommendations:  HH PT OT Disposition:  pending    New diagnosed atrial fibrillation Heart failure with preserved EF Home meds: Lasix  40 twice daily, hydrochlorothiazide  20 daily, metoprolol  125 twice daily and Imdur  Continue telemetry monitoring EKG confirmed Afib Rate controlled with intermittent RVR 2D echo EF 60-65%, no shunts. On home metoprolol  but lower dose 75mg  bid On home Lasix  but lower dose 20 daily On home imdur  Begin anticoagulation with Eliquis  on discharge with improved Hb Follows with [facility] clinic cardiology, will need an outpatient follow-up appointment for medication management and new onset A-fib   Hypertension Home meds:  metoprolol , Imdur , hydrochlorothiazide , Lasix  Stable on the lower end On home Imdur  On home metoprolol  but lower dose 75mg  bid On home Lasix  20mg  daily  Avoid low BP Long-term goal normotensive   Hyperlipidemia Home meds:  simvastatin  40, resumed in hospital LDL 24, goal < 70 No high intensity statin given LDL at goal Continue statin at discharge   Diabetes type II Controlled Home meds:  metformin  HgbA1c 6.0, goal < 7.0 CBGs SSI Recommend close follow-up with PCP for better DM control   Severe anemia Hb 8.5 -> 7.0--9.5--8.9 iron  transfusion per her outpatient protocol transfuse 1 unit red cells Outpatient follow-up with hematology for IV iron  infusion and anemia   Other Stroke Risk Factors Obesity, Body mass index is 41.63 kg/m., BMI >/= 30 associated with increased stroke risk, recommend weight loss, diet and exercise as appropriate  OSA ? TIA vs. Complicated migraine in 12/2014, had holter monitor 2-3 days and was told no afib.   Other Active  Problems AKI Cre 1.18--1.17--1.01 Back pain Home meds: Hydrocodone , gabapentin , celebrex (stopped at discharge) Insomnia Trazadone     RN Pressure Injury Documentation:  None documented   DISCHARGE EXAM Blood pressure (!) 121/97, pulse (!) 109, temperature 97.9 F (36.6 C), temperature source Oral, resp. rate 20, weight 110 kg, SpO2 95%. PHYSICAL EXAM General:  Alert, well-nourished, well-developed patient in no acute distress Psych:  Mood and affect appropriate for situation CV: Regular rate and rhythm on monitor Respiratory:  Regular, unlabored respirations on room air GI: Abdomen soft and nontender     NEURO:  Mental Status: AA&Ox3, patient is able to give clear and coherent history Speech/Language: speech is without dysarthria or aphasia.  Naming, repetition, fluency, and comprehension intact.   Cranial Nerves:  II: PERRL. Visual fields full.  III, IV, VI: EOMI. Eyelids elevate symmetrically.  V: Sensation is intact to light touch and symmetrical to face.  VII: Facial droop left  VIII: hearing intact to voice. IX, X: Palate elevates symmetrically. Phonation is normal.  KP:Dynloizm shrug 5/5. XII: tongue is midline without  fasciculations. Motor: LUE and LLE with mild drift  Full strength RUE and RLE Tone: is normal and bulk is normal Sensation- Intact to light touch bilaterally.  Mild Left neglect Coordination: FTN intact bilaterally, HKS: no ataxia in BLE in relation to weakness Gait- deferred  Discharge Diet       Diet   Diet heart healthy/carb modified Room service appropriate? Yes with Assist; Fluid consistency: Thin   liquids  DISCHARGE PLAN Disposition:  discharge to home with [facility] OT/PT Eliquis  (apixaban ) daily for secondary stroke prevention for 3 indefinitely Ongoing stroke risk factor control by Primary Care Physician at time of discharge Follow-up PCP Auston Reyes BIRCH, MD in 1 -2 weeks[facility]   Greater than 40  minutes were spent preparing discharge.  Harlene Pouch, MSN, NP-C [facility] See AMION or use Epic Chat   ATTENDING NOTE: I reviewed above note and agree with the assessment and plan. Pt was seen and examined.   No family at bedside.  Patient neurologically doing well, left facial droop much improved, NIH was 0 at this time.  After 1 unit PRBC transfusion yesterday, currently hemoglobin stable now 8.5, will start Eliquis  today on discharge.  Still has intermittent A-fib RVR, increase metoprolol  dose from 50 twice daily to 75 twice daily, resume home Lasix  but on a lower dose 20 mg daily.  Follow-up with [facility] neurology, cardiology and hematology.  For detailed assessment and plan, please refer to above as I have made changes wherever appropriate.   Ary Cummins, MD PhD Stroke Neurology 08/12/2024 3:55 PM

## 2024-08-12 NOTE — Discharge Instructions (Signed)

## 2024-08-12 NOTE — Progress Notes (Signed)
 Physical Therapy Treatment Patient Details Name: Margaret Frye MRN: [id number] DOB: [birth year] Today's Date: 08/12/2024   History of Present Illness 78 yo female admits to [facility] on 08/10/24 from [place] for sudden onset of L sided weakness and numbness. TNK administered 2247 on 9/15. 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 S/p R M2 thrombectomy 9/16. PMH: DM, CA, CHF, hypothyroidism    PT Comments  Pt more alert today, with WFL affect. Pt demonstrating less L inattention this date, with less cuing required to correct. Pt ambulatory for hallway distance with use of RW, overall pt requiring supervision to CGA for safety. No physical assist needed during session. Plan remains appropriate, PT to continue to follow while acute.      If plan is discharge home, recommend the following: A little help with walking and/or transfers;A little help with bathing/dressing/bathroom;Assist for transportation   Can travel by private vehicle        Equipment Recommendations  None recommended by PT    Recommendations for Other Services       Precautions / Restrictions Precautions Precautions: Fall Restrictions Weight Bearing Restrictions Per Provider Order: No     Mobility  Bed Mobility               General bed mobility comments: up in chair    Transfers Overall transfer level: Needs assistance Equipment used: Rolling walker (2 wheels) Transfers: Sit to/from Stand Sit to Stand: Supervision           General transfer comment: for safety, pt able to rise and steady without assist with increased time    Ambulation/Gait Ambulation/Gait assistance: Contact guard assist Gait Distance (Feet): 150 Feet Assistive device: None Gait Pattern/deviations: Step-through pattern, Decreased stride length, Trunk flexed Gait velocity: decr     General Gait Details: close guard for safety, no physical assist. tolerates challenge well, though endorses dizziness  with horizontal head turns   Stairs             Wheelchair Mobility     Tilt Bed    Modified Rankin (Stroke Patients Only) Modified Rankin (Stroke Patients Only) Pre-Morbid Rankin Score: No symptoms Modified Rankin: Moderate disability     Balance Overall balance assessment: Needs assistance Sitting-balance support: Bilateral upper extremity supported, Feet supported Sitting balance-Leahy Scale: Good     Standing balance support: Bilateral upper extremity supported, During functional activity, Reliant on assistive device for balance Standing balance-Leahy Scale: Fair Standing balance comment: navigates hallway well, tolerates head turns but endorses dizziness                            Communication Communication Communication: No apparent difficulties  Cognition Arousal: Alert Behavior During Therapy: WFL for tasks assessed/performed   PT - Cognitive impairments: No apparent impairments                         Following commands: Intact      Cueing Cueing Techniques: Verbal cues, Tactile cues  Exercises      General Comments General comments (skin integrity, edema, etc.): vss, HRmax observed 113 bpm and in irregular rhythm throughout      Pertinent Vitals/Pain Pain Assessment Pain Assessment: No/denies pain Pain Intervention(s): Monitored during session    Home Living  Prior Function            PT Goals (current goals can now be found in the care plan section) Acute Rehab PT Goals Patient Stated Goal: Return home and indepedence PT Goal Formulation: With patient Time For Goal Achievement: 08/24/24 Potential to Achieve Goals: Good Progress towards PT goals: Progressing toward goals    Frequency    Min 2X/week      PT Plan      Co-evaluation              AM-PAC PT 6 Clicks Mobility   Outcome Measure  Help needed turning from your back to your side while in a flat bed  without using bedrails?: A Little Help needed moving from lying on your back to sitting on the side of a flat bed without using bedrails?: A Little Help needed moving to and from a bed to a chair (including a wheelchair)?: A Little Help needed standing up from a chair using your arms (e.g., wheelchair or bedside chair)?: A Little Help needed to walk in hospital room?: A Little Help needed climbing 3-5 steps with a railing? : A Little 6 Click Score: 18    End of Session Equipment Utilized During Treatment: Gait belt Activity Tolerance: Patient tolerated treatment well Patient left: in chair;with call bell/phone within reach;with chair alarm set Nurse Communication: Mobility status PT Visit Diagnosis: Other abnormalities of gait and mobility (R26.89);Muscle weakness (generalized) (M62.81)     Time: 0912-0927 PT Time Calculation (min) (ACUTE ONLY): 15 min  Charges:    $Gait Training: 8-22 mins PT General Charges $$ ACUTE PT VISIT: 1 Visit                     Johana RAMAN, PT DPT Acute Rehabilitation Services Secure Chat Preferred  Office [phone]    Margaret Frye 08/12/2024, 10:06 AM

## 2024-08-13 ENCOUNTER — Telehealth: Payer: Self-pay | Admitting: Oncology

## 2024-08-13 NOTE — Telephone Encounter (Signed)
 Per in basket message- Please schedule patient for lab/Finn (hospital follow up) in 2 weeks. Thanks!   I called the pt, no answer so I left a vm for the pt to return my call so I can get her scheduled.

## 2024-08-14 DIAGNOSIS — I69354 Hemiplegia and hemiparesis following cerebral infarction affecting left non-dominant side: Secondary | ICD-10-CM | POA: Diagnosis not present

## 2024-08-14 DIAGNOSIS — E1165 Type 2 diabetes mellitus with hyperglycemia: Secondary | ICD-10-CM | POA: Diagnosis not present

## 2024-08-14 DIAGNOSIS — Z7901 Long term (current) use of anticoagulants: Secondary | ICD-10-CM | POA: Diagnosis not present

## 2024-08-14 DIAGNOSIS — E785 Hyperlipidemia, unspecified: Secondary | ICD-10-CM | POA: Diagnosis not present

## 2024-08-14 DIAGNOSIS — I69392 Facial weakness following cerebral infarction: Secondary | ICD-10-CM | POA: Diagnosis not present

## 2024-08-14 DIAGNOSIS — I4891 Unspecified atrial fibrillation: Secondary | ICD-10-CM | POA: Diagnosis not present

## 2024-08-14 DIAGNOSIS — D649 Anemia, unspecified: Secondary | ICD-10-CM | POA: Diagnosis not present

## 2024-08-14 DIAGNOSIS — I69322 Dysarthria following cerebral infarction: Secondary | ICD-10-CM | POA: Diagnosis not present

## 2024-08-14 DIAGNOSIS — I5030 Unspecified diastolic (congestive) heart failure: Secondary | ICD-10-CM | POA: Diagnosis not present

## 2024-08-14 DIAGNOSIS — I110 Hypertensive heart disease with heart failure: Secondary | ICD-10-CM | POA: Diagnosis not present

## 2024-08-17 DIAGNOSIS — E782 Mixed hyperlipidemia: Secondary | ICD-10-CM | POA: Diagnosis not present

## 2024-08-17 DIAGNOSIS — I493 Ventricular premature depolarization: Secondary | ICD-10-CM | POA: Diagnosis not present

## 2024-08-17 DIAGNOSIS — I5032 Chronic diastolic (congestive) heart failure: Secondary | ICD-10-CM | POA: Diagnosis not present

## 2024-08-17 DIAGNOSIS — I4819 Other persistent atrial fibrillation: Secondary | ICD-10-CM | POA: Diagnosis not present

## 2024-08-17 DIAGNOSIS — I10 Essential (primary) hypertension: Secondary | ICD-10-CM | POA: Diagnosis not present

## 2024-08-17 DIAGNOSIS — I4891 Unspecified atrial fibrillation: Secondary | ICD-10-CM | POA: Diagnosis not present

## 2024-08-19 DIAGNOSIS — I5030 Unspecified diastolic (congestive) heart failure: Secondary | ICD-10-CM | POA: Diagnosis not present

## 2024-08-19 DIAGNOSIS — E1165 Type 2 diabetes mellitus with hyperglycemia: Secondary | ICD-10-CM | POA: Diagnosis not present

## 2024-08-19 DIAGNOSIS — I110 Hypertensive heart disease with heart failure: Secondary | ICD-10-CM | POA: Diagnosis not present

## 2024-08-19 DIAGNOSIS — I69322 Dysarthria following cerebral infarction: Secondary | ICD-10-CM | POA: Diagnosis not present

## 2024-08-19 DIAGNOSIS — I4891 Unspecified atrial fibrillation: Secondary | ICD-10-CM | POA: Diagnosis not present

## 2024-08-19 DIAGNOSIS — I69392 Facial weakness following cerebral infarction: Secondary | ICD-10-CM | POA: Diagnosis not present

## 2024-08-19 DIAGNOSIS — I69354 Hemiplegia and hemiparesis following cerebral infarction affecting left non-dominant side: Secondary | ICD-10-CM | POA: Diagnosis not present

## 2024-08-20 DIAGNOSIS — G894 Chronic pain syndrome: Secondary | ICD-10-CM | POA: Diagnosis not present

## 2024-08-20 DIAGNOSIS — E66812 Obesity, class 2: Secondary | ICD-10-CM | POA: Diagnosis not present

## 2024-08-20 DIAGNOSIS — E039 Hypothyroidism, unspecified: Secondary | ICD-10-CM | POA: Diagnosis not present

## 2024-08-20 DIAGNOSIS — E118 Type 2 diabetes mellitus with unspecified complications: Secondary | ICD-10-CM | POA: Diagnosis not present

## 2024-08-20 DIAGNOSIS — Z79899 Other long term (current) drug therapy: Secondary | ICD-10-CM | POA: Diagnosis not present

## 2024-08-20 DIAGNOSIS — E782 Mixed hyperlipidemia: Secondary | ICD-10-CM | POA: Diagnosis not present

## 2024-08-20 DIAGNOSIS — I10 Essential (primary) hypertension: Secondary | ICD-10-CM | POA: Diagnosis not present

## 2024-08-20 DIAGNOSIS — R197 Diarrhea, unspecified: Secondary | ICD-10-CM | POA: Diagnosis not present

## 2024-08-20 NOTE — Discharge Summary (Addendum)
 PHYSICIAN DISCHARGE SUMMARY        Patient ID: Margaret Frye MRN: [id number] DOB/AGE: [birth year] 78 y.o.  Admit date: 08/09/2024 Discharge date: 08/20/2024    Discharge Diagnoses: Acute Right MCA stroke secondary to right M2 Occlusion                                                                       DISCHARGE PLAN BY DIAGNOSIS     #Acute RCJ:Emzdzwupwh w/Acute Left sided weakness, numbness, dysarthria, left facial droop Suspect RMCA Stroke iso right M2 occlusion CTA (9/15) shows Right proximal M2 MCA occlusion with mid M2 MCA reconstitution  Post IV TNK for Ischemic Stroke @2247  -VS with neuro checks/NIHSS  per TNK protocol for the first 24 hours  -No lines, catheters, antiplatelet or anticoagulants x24 hr after IV TNK unless required  -keep BP <185/100 with short-acting antihypertensives (Labetolol, Hydralazine  or Nicardipine Gtt as needed).  -MRI/MRA brain without contrast per stroke protocol -TTE to r/o cardioembolic source, assess EF  -check fasting Lipid Panel with Direct LDL in AM, HgA1c, TSH  -repeat head CT in 24 hours s/p IV Alteplase per protocol. -Place SCDs for DVT prevention. -Keep NPO  -PT consult, OT consult, Speech consult -Aspiration precautions, Bleeding precautions -Obtain STAT head CT for any new acute headache or new neurological deficits -Discussed finding with on-call neurologist and neurointerventional radiologist at [facility] who recommends transferring patient to [facility] for possible endovascular thrombectomy   #Chronic HFpEF #[id number] -Euvolemic on exam.  -BNP (9/16):296.3 -ECHO (12/24): LVEF 55%, grade III diastolic dysfunction, mild MR -Home GDMT of: Imdur , metoprolol , jardiance, Lasix  -Continue simvastatin  once able to tolerate p.o. -Strict I/Os -Obtain 2D echo   #Mild AKI likely iso IV contrast -Follow BMP+Mag -Ensure adequate renal perfusion -Avoid nephrotoxic  -Replace lytes   #Type II diabetes mellitus  -Check  A1c -CBG's q4hrs  -SSI -Target CBG readings 140 to 180 -Follow hypo/hyperglycemic protocol  -Hold home metformin    #Hypothyroidism -Check TSH, Free T4  -Continue Synthroid    #Macrocytic Anemia~no signs of active bleeding  Hx of IDA follows with oncology -Trend CBC  -Monitor for s/sx of bleeding  -Transfuse for hgb <7   #Anxiety and Depression -Hold home meds for now                DISCHARGE SUMMARY   Margaret Frye is a 78 y.o. y/o female with a PMH of IDA, Anxiety and Depression, HTN, HLD, Hypothyroidism, OSA, T2DM, HFpEF, PVD, PVCs, TIA, who presented to the ED as a code stroke.   According to the patient's husband, who was at the bedside, the patient was sitting in her recliner when he asked her to charge his phone and briefly left the room. He returned about five minutes later and found her struggling to adjust the recliner and appearing confused. He observed that she was slumped to the left, with left facial droop and drooling. When he tried to speak with her, her speech was slurred and difficult to understand. The patient then stood up and fell to the floor. Her husband immediately called EMS.   ED Course: Initial vital signs showed HR of beats/minute, BP mm Hg, the RR 30 breaths/minute, and the oxygen saturation % on and a temperature  of 98.56F (36.9C). Code stroke was initiated and patient evaluated by teleneurologist. A CT head neg for acute finding. On exam patient had LUE/LLE drift, left facial droop, mild to moderate dysarthria, and left sided sensory deficit. Initial NIHSS was 5. Contraindication for thrombolytics was reviewed with patient and family at the bedside. Risk and benefit of IV thrombolytic was discussed and they consented to TNK administration. TNK was administered at 2247. Patient tolerated tPA without complication.   Pertinent Labs/Diagnostics Findings: Na+/ K+:141/3.6  Glucose:135 BUN/Cr.:18/1.18 WBC:7.3 K/L Hgb/Hct:8.5/28.9  BNP:296.3   CTH>neg CTA Head  and Neck>Right proximal M2 MCA occlusion with mid M2 MCA reconstitution    While the patient remained in the ED, their NIHSS worsened. Based on CTA findings, the Neurologist and Neuro Interventional Radiologist at [facility] were contacted for evaluation for endovascular thrombectomy. Patient accepted for transfer. A STAT CTP obtained pending transfer.      SIGNIFICANT DIAGNOSTIC STUDIES No results found.  SIGNIFICANT EVENTS 9/15:Admit to ICU with Acute CVA s/p TNK 9/16: Transferred to [facility] for Endovascular Thrombectomy  MICRO DATA  Non  ANTIBIOTICS none  CONSULTS Neurology  TUBES / LINES None  DISCHARGE EXAM: GEN: Critically ill patient, WDWN in NAD HEENT: [place]/AT. PERRL, sclerae anicteric. HEART: Regular rate and rhythm. Grade 2 systolic murmur mitral area  LUNGS: CTAB, no increased WOB,  EXTREMITIES: Bilater Lower Extremities 2+pitting Edema, cap refill <3sec NEURO: .MENTAL STATUS: AAOx3 LANG/SPEECH: dysarthria with intact naming and repetition - follows commands appropriately CRANIAL NERVES: II: Pupils equal and reactive, no RAPD, Lt hemianopia III, IV, VI: EOM intact, no gaze preference or deviation, no nystagmus. V: normal VII: Lt facial weakness VIII: normal hearing to speech. Rest of cranial nerves are intact MOTOR: 1/5 in Lt upper extremity and 1/5 in Lt lower extremity 5/5 in Rt upper and lower extremity REFLEXES: 2/4 throughout, left extensor plantar SENSORY: decreased to touch and pain prick on left side COORD: Normal finger to nose on right side - no dysmetria or tremors. Gait: deferred due to weakness PSYCH:  Mood and Affect: Mood normal.  ABDOMINAL: Soft: BS x 4, NTND SKIN: Intact, warm, no rashes lesion, or ulcer  Vitals:   08/10/24 0117 08/10/24 0130 08/10/24 0145 08/10/24 0147  BP: (!) 98/58 (!) 108/45 (!) 107/47 (!) 107/47  Pulse:  (!) 56 (!) 54   Resp:  12 13   Temp:      TempSrc:      SpO2:  100% 100%   Weight:      Height:        Discharge Labs  BMET No results for input(s): NA, K, CL, CO2, GLUCOSE, BUN, CREATININE, CALCIUM, MG, PHOS in the last 168 hours.  CBC No results for input(s): HGB, HCT, WBC, PLT in the last 168 hours.  Anti-Coagulation No results for input(s): INR in the last 168 hours.  Discharge Instructions     Diet - low sodium heart healthy   Complete by: As directed    Increase activity slowly   Complete by: As directed         Allergies as of 08/10/2024   No Known Allergies      Medication List     TAKE these medications    albuterol  108 (90 Base) MCG/ACT inhaler Commonly known as: VENTOLIN  HFA Inhale 2 puffs into the lungs every 6 (six) hours as needed for wheezing.   docusate sodium  100 MG capsule Commonly known as: COLACE Take 100 mg by mouth daily.   empagliflozin 10  MG Tabs tablet Commonly known as: JARDIANCE Take 10 mg by mouth daily.   ergocalciferol  1.25 MG (50000 UT) capsule Commonly known as: VITAMIN D2 Take 50,000 Units by mouth every Sunday.   fluticasone  50 MCG/ACT nasal spray Commonly known as: FLONASE  Place 1-2 sprays into both nostrils daily as needed. For nasal congestion.   gabapentin  300 MG capsule Commonly known as: NEURONTIN  Take 300 mg by mouth 4 (four) times daily.   levocetirizine 5 MG tablet Commonly known as: XYZAL  Take 5 mg by mouth at bedtime.   metFORMIN  500 MG tablet Commonly known as: GLUCOPHAGE  Take 1,000 mg by mouth 2 (two) times daily.   omeprazole 40 MG capsule Commonly known as: PRILOSEC Take 40 mg by mouth daily before breakfast.   simvastatin  40 MG tablet Commonly known as: ZOCOR  Take 40 mg by mouth at bedtime.   traZODone  100 MG tablet Commonly known as: DESYREL  Take 100 mg by mouth at bedtime.   triamcinolone  cream 0.1 % Commonly known as: KENALOG  Apply 1 application topically 2 (two) times daily as needed (for psoriasis (typically once daily)).         Disposition:  TRANSFER  Discharged Condition: HEELA HEISHMAN has NOT met maximum benefit of inpatient care and is NOT medically stable and cleared for discharge.  Patient is pending TRANSFER to [facility] for Neuro Interventional procedure.      Time spent on disposition:  Greater than 45 minutes.     Almarie Nose, DNP, CCRN, FNP-C, AGACNP-BC Acute Care & Family Nurse Practitioner  [facility]  See Amion for personal pager PCCM on call pager [phone] until 7 am

## 2024-08-25 ENCOUNTER — Inpatient Hospital Stay: Attending: Oncology

## 2024-08-25 ENCOUNTER — Encounter: Payer: Self-pay | Admitting: Oncology

## 2024-08-25 ENCOUNTER — Inpatient Hospital Stay (HOSPITAL_BASED_OUTPATIENT_CLINIC_OR_DEPARTMENT_OTHER): Admitting: Oncology

## 2024-08-25 VITALS — BP 140/78 | HR 84 | Temp 98.5°F | Resp 16 | Wt 211.5 lb

## 2024-08-25 DIAGNOSIS — D509 Iron deficiency anemia, unspecified: Secondary | ICD-10-CM | POA: Diagnosis not present

## 2024-08-25 DIAGNOSIS — I4891 Unspecified atrial fibrillation: Secondary | ICD-10-CM | POA: Insufficient documentation

## 2024-08-25 DIAGNOSIS — Z7901 Long term (current) use of anticoagulants: Secondary | ICD-10-CM | POA: Diagnosis not present

## 2024-08-25 DIAGNOSIS — Z8673 Personal history of transient ischemic attack (TIA), and cerebral infarction without residual deficits: Secondary | ICD-10-CM | POA: Diagnosis not present

## 2024-08-25 DIAGNOSIS — D649 Anemia, unspecified: Secondary | ICD-10-CM

## 2024-08-25 LAB — IRON AND TIBC
Iron: 51 ug/dL (ref 28–170)
Saturation Ratios: 16 % (ref 10.4–31.8)
TIBC: 321 ug/dL (ref 250–450)
UIBC: 270 ug/dL

## 2024-08-25 LAB — CBC WITH DIFFERENTIAL/PLATELET
Abs Immature Granulocytes: 0.07 K/uL (ref 0.00–0.07)
Basophils Absolute: 0 K/uL (ref 0.0–0.1)
Basophils Relative: 0 %
Eosinophils Absolute: 0 K/uL (ref 0.0–0.5)
Eosinophils Relative: 0 %
HCT: 35.9 % — ABNORMAL LOW (ref 36.0–46.0)
Hemoglobin: 10.9 g/dL — ABNORMAL LOW (ref 12.0–15.0)
Immature Granulocytes: 1 %
Lymphocytes Relative: 12 %
Lymphs Abs: 0.9 K/uL (ref 0.7–4.0)
MCH: 27.5 pg (ref 26.0–34.0)
MCHC: 30.4 g/dL (ref 30.0–36.0)
MCV: 90.7 fL (ref 80.0–100.0)
Monocytes Absolute: 0.2 K/uL (ref 0.1–1.0)
Monocytes Relative: 3 %
Neutro Abs: 6.3 K/uL (ref 1.7–7.7)
Neutrophils Relative %: 84 %
Platelets: 343 K/uL (ref 150–400)
RBC: 3.96 MIL/uL (ref 3.87–5.11)
RDW: 21.2 % — ABNORMAL HIGH (ref 11.5–15.5)
WBC: 7.5 K/uL (ref 4.0–10.5)
nRBC: 0.3 % — ABNORMAL HIGH (ref 0.0–0.2)

## 2024-08-25 LAB — FERRITIN: Ferritin: 106 ng/mL (ref 11–307)

## 2024-08-25 NOTE — Progress Notes (Unsigned)
 Pt suffered stroke on 08/09/24 and was hospitalized.  Pt discharged 08/19/24. In follow up with daughter.

## 2024-08-25 NOTE — Progress Notes (Unsigned)
 [facility]  [phone] [phone]  ID: Margaret Frye OB: February 02, 1946  MR#: [id number]  RDW#:[id number]  Patient Care Team: Auston Reyes BIRCH, MD as PCP - General (Internal Medicine)  CHIEF COMPLAINT: Iron  deficiency anemia.  INTERVAL HISTORY: Patient returns to clinic today for repeat laboratory work and hospital follow-up.  She was recently admitted with acute CVA secondary to thromboembolism from A-fib.  While in the hospital she also received 1 unit of packed red blood cells.  She denies any residual symptoms from her CVA, but continues to have weakness and fatigue.  She denies any recent fevers.  She has a good appetite and denies weight loss.  She has no chest pain, shortness of breath, cough, or hemoptysis.  She denies any nausea, vomiting, constipation, or diarrhea.  She has no melena or hematochezia.  She has no urinary complaints.  Patient offers no further specific complaints.  REVIEW OF SYSTEMS:   Review of Systems  Constitutional:  Positive for malaise/fatigue. Negative for fever and weight loss.  Respiratory: Negative.  Negative for cough, hemoptysis and shortness of breath.   Cardiovascular: Negative.  Negative for chest pain and leg swelling.  Gastrointestinal: Negative.  Negative for abdominal pain, blood in stool and melena.  Genitourinary: Negative.  Negative for dysuria.  Musculoskeletal: Negative.  Negative for back pain.  Skin: Negative.  Negative for rash.  Neurological:  Positive for weakness. Negative for dizziness, focal weakness and headaches.  Psychiatric/Behavioral: Negative.  The patient is not nervous/anxious.     As per HPI. Otherwise, a complete review of systems is negative.  PAST MEDICAL HISTORY: Past Medical History:  Diagnosis Date   Anemia    Arthritis    Asthma    Borderline glaucoma    Depression    Diabetes mellitus without complication (HCC)    Edema    Elevated liver enzymes    Environmental  allergies    Family history of adverse reaction to anesthesia    Daughter had PONV   GERD (gastroesophageal reflux disease)    Headache    migraine   Heart palpitations    History of hiatal hernia    Hyperlipidemia    Hypertension    Hypothyroidism    Lumbar stenosis    PONV (postoperative nausea and vomiting)    Sleep apnea    does not wear CPAP  my oxygen level drops when I sleep   TIA (transient ischemic attack)    Vitamin B deficiency    Vitamin D  deficiency    Wears glasses    Wears partial dentures     PAST SURGICAL HISTORY: Past Surgical History:  Procedure Laterality Date   ABDOMINAL HYSTERECTOMY       partial   anterior cevical fusion     APPENDECTOMY     BACK SURGERY     lumbar laminectomy   BREAST EXCISIONAL BIOPSY Left 2000   benign   CATARACT EXTRACTION W/ INTRAOCULAR LENS  IMPLANT, BILATERAL     CHOLECYSTECTOMY     COLONOSCOPY W/ BIOPSIES     IR CT HEAD LTD  08/10/2024   IR PERCUTANEOUS ART THROMBECTOMY/INFUSION INTRACRANIAL INC DIAG ANGIO  08/10/2024   IR US  GUIDE VASC ACCESS RIGHT  08/10/2024   MULTIPLE TOOTH EXTRACTIONS     RADIOLOGY WITH ANESTHESIA N/A 08/10/2024   Procedure: RADIOLOGY WITH ANESTHESIA;  Surgeon: Radiologist, Medication, MD;  Location: [facility] OR;  Service: Radiology;  Laterality: N/A;   TUBAL LIGATION      FAMILY HISTORY:  Family History  Problem Relation Age of Onset   Breast cancer Sister 46   Uterine cancer Mother    Diabetes Mother    Glaucoma Mother    Heart disease Father    Peripheral vascular disease Father     ADVANCED DIRECTIVES (Y/N):  N  HEALTH MAINTENANCE: Social History   Tobacco Use   Smoking status: Never   Smokeless tobacco: Never  Vaping Use   Vaping status: Never Used  Substance Use Topics   Alcohol  use: No   Drug use: No     Colonoscopy:  PAP:  Bone density:  Lipid panel:  No Known Allergies  Current Outpatient Medications  Medication Sig Dispense Refill   albuterol  (VENTOLIN  HFA) 108 (90  Base) MCG/ACT inhaler Inhale 2 puffs into the lungs every 6 (six) hours as needed for wheezing.     apixaban  (ELIQUIS ) 5 MG TABS tablet Take 1 tablet (5 mg total) by mouth 2 (two) times daily. 60 tablet 0   busPIRone (BUSPAR) 10 MG tablet Take 10 mg by mouth 2 (two) times daily.     cyanocobalamin  (VITAMIN B12) 1000 MCG/ML injection Inject 1,000 mcg into the muscle every 30 (thirty) days.     DULoxetine  (CYMBALTA ) 30 MG capsule Take 30 mg by mouth.     empagliflozin (JARDIANCE) 10 MG TABS tablet Take 10 mg by mouth daily.     ergocalciferol  (VITAMIN D2) 50000 units capsule Take 50,000 Units by mouth every Sunday.     fluticasone  (FLONASE ) 50 MCG/ACT nasal spray Place 1-2 sprays into both nostrils daily as needed. For nasal congestion.     furosemide  (LASIX ) 20 MG tablet Take 1 tablet (20 mg total) by mouth daily. 30 tablet 0   gabapentin  (NEURONTIN ) 300 MG capsule Take 300 mg by mouth 4 (four) times daily.     HYDROcodone -acetaminophen  (NORCO) 10-325 MG tablet Take 1 tablet by mouth.     latanoprost  (XALATAN ) 0.005 % ophthalmic solution Place 1 drop into both eyes at bedtime.     levocetirizine (XYZAL ) 5 MG tablet Take 5 mg by mouth at bedtime.     levothyroxine  (SYNTHROID ) 75 MCG tablet Take 1 tablet (75 mcg total) by mouth daily at 6 (six) AM. 30 tablet 0   magnesium oxide (MAG-OX) 400 MG tablet Take 400 mg by mouth.     metFORMIN  (GLUCOPHAGE ) 500 MG tablet Take 1,000 mg by mouth 2 (two) times daily.     metoprolol  succinate (TOPROL -XL) 25 MG 24 hr tablet Take 3 tablets (75 mg total) by mouth 2 (two) times daily. Take with or immediately following a meal. 180 tablet 0   omeprazole (PRILOSEC) 40 MG capsule Take 40 mg by mouth daily before breakfast.     potassium chloride  (KLOR-CON ) 10 MEQ tablet Take 10 mEq by mouth.     predniSONE  (DELTASONE ) 10 MG tablet Taper 6-6-4-4-2-2-1-1-off     simvastatin  (ZOCOR ) 40 MG tablet Take 40 mg by mouth at bedtime.     traZODone  (DESYREL ) 100 MG tablet Take  100 mg by mouth at bedtime.     triamcinolone  cream (KENALOG ) 0.1 % Apply 1 application topically 2 (two) times daily as needed (for psoriasis (typically once daily)).     docusate sodium  (COLACE) 100 MG capsule Take 100 mg by mouth daily. (Patient not taking: Reported on 08/25/2024)     No current facility-administered medications for this visit.    OBJECTIVE: Vitals:   08/25/24 1459  BP: (!) 140/78  Pulse: 84  Resp: 16  Temp: 98.5 F (36.9 C)  SpO2: 95%     Body mass index is 36.3 kg/m.    ECOG FS:1 - Symptomatic but completely ambulatory  General: Well-developed, well-nourished, no acute distress. Eyes: Pink conjunctiva, anicteric sclera. HEENT: Normocephalic, moist mucous membranes. Lungs: No audible wheezing or coughing. Heart: Regular rate and rhythm. Abdomen: Soft, nontender, no obvious distention. Musculoskeletal: No edema, cyanosis, or clubbing. Neuro: Alert, answering all questions appropriately. Cranial nerves grossly intact. Skin: No rashes or petechiae noted. Psych: Normal affect.  LAB RESULTS:  Lab Results  Component Value Date   NA 143 08/12/2024   K 3.7 08/12/2024   CL 99 08/12/2024   CO2 28 08/12/2024   GLUCOSE 141 (H) 08/12/2024   BUN 9 08/12/2024   CREATININE 1.01 (H) 08/12/2024   CALCIUM 9.1 08/12/2024   PROT 6.1 (L) 08/09/2024   ALBUMIN  3.1 (L) 08/09/2024   AST 21 08/09/2024   ALT 9 08/09/2024   ALKPHOS 43 08/09/2024   BILITOT 0.9 08/09/2024   GFRNONAA 58 (L) 08/12/2024   GFRAA >60 06/05/2017    Lab Results  Component Value Date   WBC 7.5 08/25/2024   NEUTROABS 6.3 08/25/2024   HGB 10.9 (L) 08/25/2024   HCT 35.9 (L) 08/25/2024   MCV 90.7 08/25/2024   PLT 343 08/25/2024   Lab Results  Component Value Date   IRON  51 08/25/2024   TIBC 321 08/25/2024   IRONPCTSAT 16 08/25/2024   Lab Results  Component Value Date   FERRITIN 106 08/25/2024      STUDIES: MR ANGIO HEAD WO CONTRAST Result Date: 08/11/2024 EXAM: MR Angiography Head  without intravenous Contrast. 08/10/2024 10:13:33 PM TECHNIQUE: Magnetic resonance angiography images of the head without intravenous contrast. Multiplanar 2D and 3D reformatted images are provided for review. COMPARISON: CT angiogram of the head dated 08/09/2024. CLINICAL HISTORY: Stroke, follow up. FINDINGS: ANTERIOR CIRCULATION: Moderate stenosis of a proximal M2 branch of the right middle cerebral artery. There is no focal occlusion clearly demonstrated. POSTERIOR CIRCULATION: Fetal origin of the right posterior cerebral artery and fetal type origin of the left posterior cerebral artery, with a relatively diminutive vertebral basilar system, normal variant. The left P1 segment is hypoplastic. IMPRESSION: 1. Moderate stenosis of a proximal M2 branch of the right middle cerebral artery. 2. Fetal origin of the right posterior cerebral artery and fetal type origin of the left posterior cerebral artery, with a relatively diminutive vertebral basilar system, normal variant. The left P1 segment is hypoplastic. Electronically signed by: Evalene Coho MD 08/11/2024 04:20 AM EDT RP Workstation: [id number]   MR BRAIN WO CONTRAST Result Date: 08/11/2024 EXAM: MRI BRAIN WITHOUT CONTRAST 08/10/2024 10:13:15 PM TECHNIQUE: Multiplanar multisequence MRI of the head/brain was performed without the administration of intravenous contrast. COMPARISON: CT of the head dated 08/09/2024. CLINICAL HISTORY: Stroke, follow up. FINDINGS: BRAIN AND VENTRICLES: No acute infarct. No intracranial hemorrhage. No mass. No midline shift. No hydrocephalus. The sella is unremarkable. Normal flow voids. Age-related atrophy and moderately advanced diffuse cerebral white matter disease. ORBITS: The patient is status post bilateral cataracts surgery. SINUSES AND MASTOIDS: No acute abnormality. BONES AND SOFT TISSUES: Normal marrow signal. No acute soft tissue abnormality. IMPRESSION: 1. No acute intracranial abnormality. 2. Age-related atrophy and  moderately advanced diffuse cerebral white matter disease. Electronically signed by: Evalene Coho MD 08/11/2024 04:02 AM EDT RP Workstation: [id number]   ECHOCARDIOGRAM COMPLETE Result Date: 08/10/2024    ECHOCARDIOGRAM REPORT   Patient Name:   Margaret Frye Poser Date of Exam: 08/10/2024 Medical  Rec #:  [id number]   Height:       64.0 in Accession #:    [id number]  Weight:       242.5 lb Date of Birth:  Jan 10, 1946   BSA:          2.123 m Patient Age:    78 years    BP:           109/53 mmHg Patient Gender: F           HR:           68 bpm. Exam Location:  Inpatient Procedure: 2D Echo and Intracardiac Opacification Agent (Both Spectral and Color            Flow Doppler were utilized during procedure). Indications:    CVA  History:        Patient has no prior history of Echocardiogram examinations.                 Risk Factors:Hypertension.  Sonographer:    Charmaine Gaskins Referring Phys: [id number] JINDONG XU IMPRESSIONS  1. Left ventricular ejection fraction, by estimation, is 60 to 65%. The left ventricle has normal function. The left ventricle has no regional wall motion abnormalities. Left ventricular diastolic function could not be evaluated.  2. Right ventricular systolic function is normal. The right ventricular size is normal.  3. Left atrial size was mild to moderately dilated.  4. The mitral valve is normal in structure. Mild mitral valve regurgitation. No evidence of mitral stenosis.  5. The aortic valve is normal in structure. Aortic valve regurgitation is trivial. No aortic stenosis is present.  6. The inferior vena cava is normal in size with greater than 50% respiratory variability, suggesting right atrial pressure of 3 mmHg. FINDINGS  Left Ventricle: Left ventricular ejection fraction, by estimation, is 60 to 65%. The left ventricle has normal function. The left ventricle has no regional wall motion abnormalities. The left ventricular internal cavity size was normal in size. There is  no left ventricular  hypertrophy. Left ventricular diastolic function could not be evaluated due to atrial fibrillation. Left ventricular diastolic function could not be evaluated. Right Ventricle: The right ventricular size is normal. No increase in right ventricular wall thickness. Right ventricular systolic function is normal. Left Atrium: Left atrial size was mild to moderately dilated. Right Atrium: Right atrial size was normal in size. Pericardium: There is no evidence of pericardial effusion. Mitral Valve: The mitral valve is normal in structure. Mild mitral annular calcification. Mild mitral valve regurgitation. No evidence of mitral valve stenosis. Tricuspid Valve: The tricuspid valve is normal in structure. Tricuspid valve regurgitation is not demonstrated. No evidence of tricuspid stenosis. Aortic Valve: The aortic valve is normal in structure. There is mild to moderate aortic valve annular calcification. Aortic valve regurgitation is trivial. No aortic stenosis is present. Pulmonic Valve: The pulmonic valve was normal in structure. Pulmonic valve regurgitation is not visualized. No evidence of pulmonic stenosis. Aorta: The aortic root is normal in size and structure. Venous: The inferior vena cava is normal in size with greater than 50% respiratory variability, suggesting right atrial pressure of 3 mmHg. IAS/Shunts: No atrial level shunt detected by color flow Doppler.  LEFT VENTRICLE PLAX 2D LVIDd:         4.80 cm     Diastology LVIDs:         2.60 cm     LV e' medial:    7.54 cm/s LV PW:  1.10 cm     LV E/e' medial:  19.1 LV IVS:        1.00 cm     LV e' lateral:   10.25 cm/s LVOT diam:     1.90 cm     LV E/e' lateral: 14.1 LVOT Area:     2.84 cm  LV Volumes (MOD) LV vol d, MOD A2C: 76.1 ml LV vol d, MOD A4C: 79.5 ml LV vol s, MOD A2C: 21.3 ml LV vol s, MOD A4C: 21.6 ml LV SV MOD A2C:     54.8 ml LV SV MOD A4C:     79.5 ml LV SV MOD BP:      55.9 ml RIGHT VENTRICLE RV Basal diam:  3.60 cm RV Mid diam:    3.30 cm RV  S prime:     15.20 cm/s LEFT ATRIUM             Index        RIGHT ATRIUM           Index LA diam:        4.10 cm 1.93 cm/m   RA Area:     12.00 cm LA Vol (A2C):   98.0 ml 46.15 ml/m  RA Volume:   27.30 ml  12.86 ml/m LA Vol (A4C):   87.7 ml 41.30 ml/m LA Biplane Vol: 95.5 ml 44.98 ml/m   AORTA Ao Asc diam: 3.30 cm MITRAL VALVE MV Area (PHT): 3.65 cm     SHUNTS MV Decel Time: 208 msec     Systemic Diam: 1.90 cm MV E velocity: 144.33 cm/s Aditya Sabharwal Electronically signed by Ria Commander Signature Date/Time: 08/10/2024/4:28:01 PM    Final    DG CHEST PORT 1 VIEW Result Date: 08/10/2024 CLINICAL DATA:  Pneumonia EXAM: PORTABLE CHEST 1 VIEW COMPARISON:  None Available. FINDINGS: Mild cardiomegaly. Mediastinal contours within normal limits. Right lung clear. Airspace opacity in the left retrocardiac region concerning for infiltrate/pneumonia. No visible effusions or acute bony abnormality. IMPRESSION: Left lower lobe/retrocardiac density could reflect atelectasis or pneumonia. Electronically Signed   By: Franky Crease M.D.   On: 08/10/2024 12:06   IR PERCUTANEOUS ART THROMBECTOMY/INFUSION INTRACRANIAL INC DIAG ANGIO Result Date: 08/10/2024 PROCEDURE PERFORMED: 1. Cerebral angiography with stroke thrombectomy 2. Ultrasound guided vascular access 3. Cone beam CT for treatment planning COMPARISON:  CT angiogram of the head and neck performed August 09, 2024 CLINICAL DATA:  78 year old female with acute ischemic stroke with symptoms of aphasia and left-sided weakness. Patient was a candidate for TNK which was administered. After multidisciplinary review, the patient was offered stroke thrombectomy. INDICATION: Acute ischemic stroke. ANESTHESIA/SEDATION: General anesthesia was utilized for the procedure. CONTRAST:  Approximately 50 cc Ominipque 300 MEDICATIONS: See MAR FLUOROSCOPY TIME:  Fluoroscopy Time: 6.5 minutes, (1009 mGy). COMPLICATIONS: None immediate. BODY OF REPORT: Following a full  explanation of the procedure along with the potential associated complications, an informed witnessed consent was obtained. The patient was then placed under general anesthesia by the Department of Anesthesiology at [facility]. The right femoral access site was prepped and draped in the usual sterile fashion. Ultrasound was used to study the right common femoral artery which was patent. Using real-time ultrasound guidance, a 19 gauge introducer needle was used to access the right common femoral artery. Access was performed at 02:40 a.m. A hard copy image ultrasound the saved and stored in PACS. Using this access, a 6 French sheath was placed in the descending thoracic aorta. Next, selective  catheterization the right internal carotid artery was performed. A selective arteriogram was performed which demonstrated a proximal right M2 occlusion. Pretreatment TICI score 1. Next, a penumbra 43 and red 62 catheter was used to selectively catheterize the right M2 horizontal segment. The first pass was performed at 0254. Following thrombectomy, recanalization of the occluded right M2 segment was achieved without evidence of residual thromboembolic disease. There was no evidence of extravasation. Post treatment TICI score 3. After reviewing the imaging, I elected to terminate the procedure at this point. Evaluation of the right femoral access site demonstrated that the site was suitable for a closure device. A 6 French Angio-Seal device was deployed without complication. Cone beam CT was then performed to evaluate for intracranial hemorrhage and treatment planning. This demonstrated no evidence of significant acute intracranial hemorrhage. The patient was removed from anesthesia and transferred to recovery in stable condition. IMPRESSION: 1. Suction thrombectomy of right M2 occlusion. PLAN: 1. To ICU for routine postoperative supportive care. Electronically Signed   By: Maude Naegeli M.D.   On: 08/10/2024 03:45   IR  US  Guide Vasc Access Right Result Date: 08/10/2024 PROCEDURE PERFORMED: 1. Cerebral angiography with stroke thrombectomy 2. Ultrasound guided vascular access 3. Cone beam CT for treatment planning COMPARISON:  CT angiogram of the head and neck performed August 09, 2024 CLINICAL DATA:  78 year old female with acute ischemic stroke with symptoms of aphasia and left-sided weakness. Patient was a candidate for TNK which was administered. After multidisciplinary review, the patient was offered stroke thrombectomy. INDICATION: Acute ischemic stroke. ANESTHESIA/SEDATION: General anesthesia was utilized for the procedure. CONTRAST:  Approximately 50 cc Ominipque 300 MEDICATIONS: See MAR FLUOROSCOPY TIME:  Fluoroscopy Time: 6.5 minutes, (1009 mGy). COMPLICATIONS: None immediate. BODY OF REPORT: Following a full explanation of the procedure along with the potential associated complications, an informed witnessed consent was obtained. The patient was then placed under general anesthesia by the Department of Anesthesiology at [facility]. The right femoral access site was prepped and draped in the usual sterile fashion. Ultrasound was used to study the right common femoral artery which was patent. Using real-time ultrasound guidance, a 19 gauge introducer needle was used to access the right common femoral artery. Access was performed at 02:40 a.m. A hard copy image ultrasound the saved and stored in PACS. Using this access, a 6 French sheath was placed in the descending thoracic aorta. Next, selective catheterization the right internal carotid artery was performed. A selective arteriogram was performed which demonstrated a proximal right M2 occlusion. Pretreatment TICI score 1. Next, a penumbra 43 and red 62 catheter was used to selectively catheterize the right M2 horizontal segment. The first pass was performed at 0254. Following thrombectomy, recanalization of the occluded right M2 segment was achieved without  evidence of residual thromboembolic disease. There was no evidence of extravasation. Post treatment TICI score 3. After reviewing the imaging, I elected to terminate the procedure at this point. Evaluation of the right femoral access site demonstrated that the site was suitable for a closure device. A 6 French Angio-Seal device was deployed without complication. Cone beam CT was then performed to evaluate for intracranial hemorrhage and treatment planning. This demonstrated no evidence of significant acute intracranial hemorrhage. The patient was removed from anesthesia and transferred to recovery in stable condition. IMPRESSION: 1. Suction thrombectomy of right M2 occlusion. PLAN: 1. To ICU for routine postoperative supportive care. Electronically Signed   By: Maude Naegeli M.D.   On: 08/10/2024 03:45   IR  CT Head Ltd Result Date: 08/10/2024 PROCEDURE PERFORMED: 1. Cerebral angiography with stroke thrombectomy 2. Ultrasound guided vascular access 3. Cone beam CT for treatment planning COMPARISON:  CT angiogram of the head and neck performed August 09, 2024 CLINICAL DATA:  78 year old female with acute ischemic stroke with symptoms of aphasia and left-sided weakness. Patient was a candidate for TNK which was administered. After multidisciplinary review, the patient was offered stroke thrombectomy. INDICATION: Acute ischemic stroke. ANESTHESIA/SEDATION: General anesthesia was utilized for the procedure. CONTRAST:  Approximately 50 cc Ominipque 300 MEDICATIONS: See MAR FLUOROSCOPY TIME:  Fluoroscopy Time: 6.5 minutes, (1009 mGy). COMPLICATIONS: None immediate. BODY OF REPORT: Following a full explanation of the procedure along with the potential associated complications, an informed witnessed consent was obtained. The patient was then placed under general anesthesia by the Department of Anesthesiology at [facility]. The right femoral access site was prepped and draped in the usual sterile fashion.  Ultrasound was used to study the right common femoral artery which was patent. Using real-time ultrasound guidance, a 19 gauge introducer needle was used to access the right common femoral artery. Access was performed at 02:40 a.m. A hard copy image ultrasound the saved and stored in PACS. Using this access, a 6 French sheath was placed in the descending thoracic aorta. Next, selective catheterization the right internal carotid artery was performed. A selective arteriogram was performed which demonstrated a proximal right M2 occlusion. Pretreatment TICI score 1. Next, a penumbra 43 and red 62 catheter was used to selectively catheterize the right M2 horizontal segment. The first pass was performed at 0254. Following thrombectomy, recanalization of the occluded right M2 segment was achieved without evidence of residual thromboembolic disease. There was no evidence of extravasation. Post treatment TICI score 3. After reviewing the imaging, I elected to terminate the procedure at this point. Evaluation of the right femoral access site demonstrated that the site was suitable for a closure device. A 6 French Angio-Seal device was deployed without complication. Cone beam CT was then performed to evaluate for intracranial hemorrhage and treatment planning. This demonstrated no evidence of significant acute intracranial hemorrhage. The patient was removed from anesthesia and transferred to recovery in stable condition. IMPRESSION: 1. Suction thrombectomy of right M2 occlusion. PLAN: 1. To ICU for routine postoperative supportive care. Electronically Signed   By: Maude Naegeli M.D.   On: 08/10/2024 03:45   CT CEREBRAL PERFUSION W CONTRAST Result Date: 08/10/2024 CLINICAL DATA:  Acute stroke due to ischemia [facility]) EXAM: CT PERFUSION BRAIN TECHNIQUE: Multiphase CT imaging of the brain was performed following IV bolus contrast injection. Subsequent parametric perfusion maps were calculated using RAPID software. RADIATION DOSE  REDUCTION: This exam was performed according to the departmental dose-optimization program which includes automated exposure control, adjustment of the mA and/or kV according to patient size and/or use of iterative reconstruction technique. CONTRAST:  40mL OMNIPAQUE  IOHEXOL  350 MG/ML SOLN COMPARISON:  Same day CT head and CTA head/neck. FINDINGS: CT Brain Perfusion Findings: CBF (<30%) Volume: 0mL Perfusion (Tmax>6.0s) volume: 74mL Mismatch Volume: 74mL ASPECTS on noncontrast CT Head: 10 earlier today. Infarct Core: 0 mL Infarction Location:No core infarct identified. The mismatch is in the right MCA territory. IMPRESSION: RAPID reports 74 mL of mismatch/penumbra in the right MCA territory. No core infarct identified. Electronically Signed   By: Gilmore GORMAN Molt M.D.   On: 08/10/2024 00:21   CT ANGIO HEAD NECK W WO CM (CODE STROKE) Addendum Date: 08/10/2024 ADDENDUM REPORT: 08/10/2024 00:08 ADDENDUM: Findings discussed with Dr.  Davis via telephone at 11:50 p.m. Electronically Signed   By: Gilmore GORMAN Molt M.D.   On: 08/10/2024 00:08   Result Date: 08/10/2024 CLINICAL DATA:  Neuro deficit, acute, stroke suspected stroke eval, exclude lvo EXAM: CT ANGIOGRAPHY HEAD AND NECK WITH AND WITHOUT CONTRAST TECHNIQUE: Multidetector CT imaging of the head and neck was performed using the standard protocol during bolus administration of intravenous contrast. Multiplanar CT image reconstructions and MIPs were obtained to evaluate the vascular anatomy. Carotid stenosis measurements (when applicable) are obtained utilizing NASCET criteria, using the distal internal carotid diameter as the denominator. RADIATION DOSE REDUCTION: This exam was performed according to the departmental dose-optimization program which includes automated exposure control, adjustment of the mA and/or kV according to patient size and/or use of iterative reconstruction technique. CONTRAST:  75mL OMNIPAQUE  IOHEXOL  350 MG/ML SOLN COMPARISON:  Same day CT  head. FINDINGS: CTA NECK FINDINGS Aortic arch: Great vessel origins are patent without significant stenosis. Aortic atherosclerosis. Right carotid system: Atherosclerosis at the carotid bifurcation without greater than 50% stenosis. Left carotid system: Atherosclerosis at the carotid bifurcation without greater than 50% stenosis. Vertebral arteries: Codominant. No evidence of dissection, stenosis (50% or greater), or occlusion. Skeleton: No evidence of acute abnormality on limited assessment. C5-C7 solid ACDF Other neck: No acute abnormality on limited assessment. Upper chest: Patchy consolidation and right greater than left upper lobes with interstitial lobular thickening Review of the MIP images confirms the above findings CTA HEAD FINDINGS Anterior circulation: Bilateral intracranial ICAs are patent without significant stenosis. The left MCA and bilateral ACAs are patent without proximal hemodynamically significant stenosis. The right M1 MCA is patent. There is an occluded proximal right M2 MCA with mid M2 MCA reconstitution. Posterior circulation: Bilateral intradural vertebral arteries, basilar artery and bilateral posterior cerebral arteries are patent without proximal hemodynamically significant stenosis. Fetal type PCAs with small vertebrobasilar system, anatomic variant. Venous sinuses: As permitted by contrast timing, patent. Review of the MIP images confirms the above findings Other: Attempting to contact the provider at the time of dictation for call of report. IMPRESSION: 1. 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 iss 2. Patchy upper lobe consolidation concerning for aspiration and/or pneumonia. 3. Suspected mild interstitial pulmonary edema. Electronically Signed: By: Gilmore GORMAN Molt M.D. On: 08/09/2024 23:41   CT HEAD CODE STROKE WO CONTRAST Result Date: 08/09/2024 CLINICAL DATA:  Code stroke.  Neuro deficit, acute, stroke suspected EXAM: CT HEAD WITHOUT CONTRAST  TECHNIQUE: Contiguous axial images were obtained from the base of the skull through the vertex without intravenous contrast. RADIATION DOSE REDUCTION: This exam was performed according to the departmental dose-optimization program which includes automated exposure control, adjustment of the mA and/or kV according to patient size and/or use of iterative reconstruction technique. COMPARISON:  CT head 12/20/2014. FINDINGS: Brain: No evidence of acute infarction, hemorrhage, hydrocephalus, extra-axial collection or mass lesion/mass effect. Patchy white matter hypodensities are compatible with chronic microvascular disease. Vascular: No hyperdense vessel. Skull: No acute fracture. Sinuses/Orbits: Clear sinuses.  No acute orbital findings. Other: No mastoid effusions ASPECTS [facility] Stroke Program Early CT Score) Total score (0-10 with 10 being normal): 10. Other: Attempted to call report at the time of dictation. IMPRESSION: 1. No evidence of acute intracranial abnormality. ASPECTS is 10. 2. Chronic microvascular ischemic disease. Code stroke imaging results were communicated on 08/09/2024 at 10:40 pm to provider Dr. Dicky via telephone. Electronically Signed   By: Gilmore GORMAN Molt M.D.   On: 08/09/2024 22:41    ASSESSMENT: Iron   deficiency anemia.  PLAN:    Iron  deficiency anemia: Patient's hemoglobin has trended up and is now 10.9.  Iron  stores are now within normal limits.  She does not require additional IV Venofer .  Patient last received treatment on August 11, 2024.  She also received 1 unit of packed red blood cells while in the hospital.  No intervention is needed.  Return to clinic as previously scheduled in January 2026 for repeat laboratory, further evaluation, and continuation of treatment if needed.   CVA: Patient reports no residual neurologic deficits.  Continue follow-up with neurology as scheduled. Atrial fibrillation: Patient is now on Eliquis .    Patient expressed understanding and was  in agreement with this plan. She also understands that She can call clinic at any time with any questions, concerns, or complaints.    Evalene JINNY Reusing, MD   08/26/2024 9:26 AM

## 2024-08-26 ENCOUNTER — Encounter: Payer: Self-pay | Admitting: Oncology

## 2024-08-30 ENCOUNTER — Encounter: Payer: Self-pay | Admitting: Oncology

## 2024-09-02 DIAGNOSIS — E1165 Type 2 diabetes mellitus with hyperglycemia: Secondary | ICD-10-CM | POA: Diagnosis not present

## 2024-09-02 DIAGNOSIS — I152 Hypertension secondary to endocrine disorders: Secondary | ICD-10-CM | POA: Diagnosis not present

## 2024-09-02 DIAGNOSIS — E1159 Type 2 diabetes mellitus with other circulatory complications: Secondary | ICD-10-CM | POA: Diagnosis not present

## 2024-09-02 DIAGNOSIS — E1122 Type 2 diabetes mellitus with diabetic chronic kidney disease: Secondary | ICD-10-CM | POA: Diagnosis not present

## 2024-09-02 DIAGNOSIS — E1169 Type 2 diabetes mellitus with other specified complication: Secondary | ICD-10-CM | POA: Diagnosis not present

## 2024-09-02 DIAGNOSIS — E039 Hypothyroidism, unspecified: Secondary | ICD-10-CM | POA: Diagnosis not present

## 2024-09-02 DIAGNOSIS — N183 Chronic kidney disease, stage 3 unspecified: Secondary | ICD-10-CM | POA: Diagnosis not present

## 2024-09-02 DIAGNOSIS — E785 Hyperlipidemia, unspecified: Secondary | ICD-10-CM | POA: Diagnosis not present

## 2024-09-02 DIAGNOSIS — E1140 Type 2 diabetes mellitus with diabetic neuropathy, unspecified: Secondary | ICD-10-CM | POA: Diagnosis not present

## 2024-09-15 DIAGNOSIS — I340 Nonrheumatic mitral (valve) insufficiency: Secondary | ICD-10-CM | POA: Diagnosis not present

## 2024-09-15 DIAGNOSIS — E782 Mixed hyperlipidemia: Secondary | ICD-10-CM | POA: Diagnosis not present

## 2024-09-15 DIAGNOSIS — I5032 Chronic diastolic (congestive) heart failure: Secondary | ICD-10-CM | POA: Diagnosis not present

## 2024-09-15 DIAGNOSIS — I4819 Other persistent atrial fibrillation: Secondary | ICD-10-CM | POA: Diagnosis not present

## 2024-09-15 DIAGNOSIS — Z01818 Encounter for other preprocedural examination: Secondary | ICD-10-CM | POA: Diagnosis not present

## 2024-09-15 DIAGNOSIS — I10 Essential (primary) hypertension: Secondary | ICD-10-CM | POA: Diagnosis not present

## 2024-09-23 DIAGNOSIS — E538 Deficiency of other specified B group vitamins: Secondary | ICD-10-CM | POA: Diagnosis not present

## 2024-09-23 DIAGNOSIS — E782 Mixed hyperlipidemia: Secondary | ICD-10-CM | POA: Diagnosis not present

## 2024-09-23 DIAGNOSIS — E039 Hypothyroidism, unspecified: Secondary | ICD-10-CM | POA: Diagnosis not present

## 2024-09-23 DIAGNOSIS — E66812 Obesity, class 2: Secondary | ICD-10-CM | POA: Diagnosis not present

## 2024-09-23 DIAGNOSIS — E118 Type 2 diabetes mellitus with unspecified complications: Secondary | ICD-10-CM | POA: Diagnosis not present

## 2024-09-23 DIAGNOSIS — Z79899 Other long term (current) drug therapy: Secondary | ICD-10-CM | POA: Diagnosis not present

## 2024-09-23 DIAGNOSIS — G894 Chronic pain syndrome: Secondary | ICD-10-CM | POA: Diagnosis not present

## 2024-09-23 DIAGNOSIS — I10 Essential (primary) hypertension: Secondary | ICD-10-CM | POA: Diagnosis not present

## 2024-09-23 DIAGNOSIS — E559 Vitamin D deficiency, unspecified: Secondary | ICD-10-CM | POA: Diagnosis not present

## 2024-10-01 DIAGNOSIS — I639 Cerebral infarction, unspecified: Secondary | ICD-10-CM

## 2024-10-01 DIAGNOSIS — I4819 Other persistent atrial fibrillation: Secondary | ICD-10-CM

## 2024-10-07 ENCOUNTER — Encounter
Admission: RE | Disposition: A | Discharge status: Home / Self Care | Payer: Self-pay | Source: Home / Self Care | Attending: Cardiology

## 2024-10-07 ENCOUNTER — Other Ambulatory Visit: Payer: Self-pay

## 2024-10-07 ENCOUNTER — Ambulatory Visit
Admission: RE | Admit: 2024-10-07 | Discharge: 2024-10-07 | Disposition: A | Source: Home / Self Care | Attending: Student | Admitting: Cardiology

## 2024-10-07 ENCOUNTER — Encounter: Payer: Self-pay | Admitting: Cardiology

## 2024-10-07 ENCOUNTER — Ambulatory Visit
Admission: RE | Admit: 2024-10-07 | Discharge: 2024-10-07 | Disposition: A | Discharge status: Home / Self Care | Attending: Cardiology | Admitting: Cardiology

## 2024-10-07 ENCOUNTER — Ambulatory Visit: Admitting: Anesthesiology

## 2024-10-07 DIAGNOSIS — E119 Type 2 diabetes mellitus without complications: Secondary | ICD-10-CM | POA: Insufficient documentation

## 2024-10-07 DIAGNOSIS — Z7901 Long term (current) use of anticoagulants: Secondary | ICD-10-CM | POA: Diagnosis not present

## 2024-10-07 DIAGNOSIS — G4730 Sleep apnea, unspecified: Secondary | ICD-10-CM | POA: Diagnosis not present

## 2024-10-07 DIAGNOSIS — I4819 Other persistent atrial fibrillation: Secondary | ICD-10-CM | POA: Diagnosis not present

## 2024-10-07 DIAGNOSIS — Z79899 Other long term (current) drug therapy: Secondary | ICD-10-CM | POA: Diagnosis not present

## 2024-10-07 DIAGNOSIS — I10 Essential (primary) hypertension: Secondary | ICD-10-CM | POA: Diagnosis not present

## 2024-10-07 DIAGNOSIS — I639 Cerebral infarction, unspecified: Secondary | ICD-10-CM

## 2024-10-07 DIAGNOSIS — I4891 Unspecified atrial fibrillation: Secondary | ICD-10-CM | POA: Diagnosis not present

## 2024-10-07 DIAGNOSIS — J45909 Unspecified asthma, uncomplicated: Secondary | ICD-10-CM | POA: Diagnosis not present

## 2024-10-07 DIAGNOSIS — I110 Hypertensive heart disease with heart failure: Secondary | ICD-10-CM | POA: Insufficient documentation

## 2024-10-07 DIAGNOSIS — I340 Nonrheumatic mitral (valve) insufficiency: Secondary | ICD-10-CM | POA: Diagnosis not present

## 2024-10-07 DIAGNOSIS — Z8673 Personal history of transient ischemic attack (TIA), and cerebral infarction without residual deficits: Secondary | ICD-10-CM | POA: Insufficient documentation

## 2024-10-07 DIAGNOSIS — E785 Hyperlipidemia, unspecified: Secondary | ICD-10-CM | POA: Insufficient documentation

## 2024-10-07 DIAGNOSIS — I5022 Chronic systolic (congestive) heart failure: Secondary | ICD-10-CM | POA: Insufficient documentation

## 2024-10-07 DIAGNOSIS — Z7984 Long term (current) use of oral hypoglycemic drugs: Secondary | ICD-10-CM | POA: Insufficient documentation

## 2024-10-07 DIAGNOSIS — G4733 Obstructive sleep apnea (adult) (pediatric): Secondary | ICD-10-CM | POA: Insufficient documentation

## 2024-10-07 DIAGNOSIS — K219 Gastro-esophageal reflux disease without esophagitis: Secondary | ICD-10-CM | POA: Insufficient documentation

## 2024-10-07 DIAGNOSIS — D649 Anemia, unspecified: Secondary | ICD-10-CM | POA: Insufficient documentation

## 2024-10-07 DIAGNOSIS — E039 Hypothyroidism, unspecified: Secondary | ICD-10-CM | POA: Diagnosis not present

## 2024-10-07 DIAGNOSIS — Z7985 Long-term (current) use of injectable non-insulin antidiabetic drugs: Secondary | ICD-10-CM | POA: Insufficient documentation

## 2024-10-07 HISTORY — PX: CARDIOVERSION: SHX1299

## 2024-10-07 HISTORY — PX: TEE WITHOUT CARDIOVERSION: SHX5443

## 2024-10-07 LAB — ECHO TEE
MV M vel: 5.09 m/s
MV Peak grad: 103.6 mmHg
Radius: 0.6 cm

## 2024-10-07 SURGERY — ECHOCARDIOGRAM, TRANSESOPHAGEAL
Anesthesia: General

## 2024-10-07 MED ORDER — PROPOFOL 10 MG/ML IV BOLUS
INTRAVENOUS | Status: DC | PRN
  Administered 2024-10-07: 70 mg via INTRAVENOUS
  Administered 2024-10-07 (×3): 20 mg via INTRAVENOUS

## 2024-10-07 MED ORDER — BUTAMBEN-TETRACAINE-BENZOCAINE 2-2-14 % EX AERO
INHALATION_SPRAY | CUTANEOUS | Status: AC
  Filled 2024-10-07: qty 5

## 2024-10-07 MED ORDER — PROPOFOL 1000 MG/100ML IV EMUL
INTRAVENOUS | Status: AC
  Filled 2024-10-07: qty 100

## 2024-10-07 MED ORDER — SODIUM CHLORIDE 0.9 % IV SOLN: INTRAVENOUS | Status: DC

## 2024-10-07 MED ORDER — LIDOCAINE VISCOUS HCL 2 % MT SOLN
OROMUCOSAL | Status: AC
  Filled 2024-10-07: qty 15

## 2024-10-07 NOTE — Procedures (Signed)
 Electrical Cardioversion Procedure Note  Indication: Atrial Fibrillation  Procedure Details: Consent: Indication, Risk/benefits of procedure as well as the alternatives explained to patient and informed consent obtained. Time out performed. Verified patient identification, verified procedure, verified correct patient position, special equipment/implants available, medications/allergies/relevent history reviewed, required imaging and test results reviewed.  Deep sedation was provided by anesthesia with propofol . Patient was delivered with 200 Joules of electricity X 1 with success to Sinus rhythm. Patient tolerated the procedure well. No immediate complication noted.   Successful cardioversion  Margaret Paterson, MD [facility] Cardiology- [facility]

## 2024-10-07 NOTE — Transfer of Care (Signed)
 Immediate Anesthesia Transfer of Care Note  Patient: Margaret Frye  Procedure(s) Performed: ECHOCARDIOGRAM, TRANSESOPHAGEAL CARDIOVERSION  Patient Location: PACU and Nursing Unit  Anesthesia Type:General  Level of Consciousness: awake, alert , and oriented  Airway & Oxygen Therapy: Patient Spontanous Breathing and Patient connected to nasal cannula oxygen  Post-op Assessment: Report given to RN and Post -op Vital signs reviewed and stable  Post vital signs: Reviewed and stable  Last Vitals:  Vitals Value Taken Time  BP 105/55 10/07/24 12:45  Temp    Pulse 57 10/07/24 12:44  Resp 14 10/07/24 12:45  SpO2 99 % 10/07/24 12:45    Last Pain:  Vitals:   10/07/24 1058  TempSrc: Temporal  PainSc: 6          Complications: No notable events documented.

## 2024-10-07 NOTE — Anesthesia Preprocedure Evaluation (Signed)
 Anesthesia Evaluation  Patient identified by MRN, date of birth, ID band Patient awake    Reviewed: Allergy & Precautions, NPO status , Patient's Chart, lab work & pertinent test results  History of Anesthesia Complications (+) PONV and history of anesthetic complications  Airway Mallampati: III  TM Distance: <3 FB Neck ROM: full    Dental  (+) Chipped   Pulmonary asthma , sleep apnea    Pulmonary exam normal        Cardiovascular hypertension, + dysrhythmias Atrial Fibrillation  Rhythm:irregular Rate:Normal     Neuro/Psych TIACVA    GI/Hepatic Neg liver ROS, hiatal hernia,GERD  Controlled,,  Endo/Other  diabetesHypothyroidism    Renal/GU negative Renal ROS  negative genitourinary   Musculoskeletal   Abdominal   Peds  Hematology negative hematology ROS (+)   Anesthesia Other Findings Past Medical History: No date: Anemia No date: Arthritis No date: Asthma No date: Borderline glaucoma No date: Depression No date: Diabetes mellitus without complication (HCC) No date: Edema No date: Elevated liver enzymes No date: Environmental allergies No date: Family history of adverse reaction to anesthesia     Comment:  Daughter had PONV No date: GERD (gastroesophageal reflux disease) No date: Headache     Comment:  migraine No date: Heart palpitations No date: History of hiatal hernia No date: Hyperlipidemia No date: Hypertension No date: Hypothyroidism No date: Lumbar stenosis No date: PONV (postoperative nausea and vomiting) No date: Sleep apnea     Comment:  does not wear CPAP  my oxygen level drops when I sleep No date: TIA (transient ischemic attack) No date: Vitamin B deficiency No date: Vitamin D  deficiency No date: Wears glasses No date: Wears partial dentures  Past Surgical History: No date: ABDOMINAL HYSTERECTOMY     Comment:    partial No date: anterior cevical fusion No date:  APPENDECTOMY No date: BACK SURGERY     Comment:  lumbar laminectomy 2000: BREAST EXCISIONAL BIOPSY; Left     Comment:  benign No date: CATARACT EXTRACTION W/ INTRAOCULAR LENS  IMPLANT, BILATERAL No date: CHOLECYSTECTOMY No date: COLONOSCOPY W/ BIOPSIES 08/10/2024: IR CT HEAD LTD 08/10/2024: IR PERCUTANEOUS ART THROMBECTOMY/INFUSION INTRACRANIAL INC  DIAG ANGIO 08/10/2024: IR US  GUIDE VASC ACCESS RIGHT No date: MULTIPLE TOOTH EXTRACTIONS 08/10/2024: RADIOLOGY WITH ANESTHESIA; N/A     Comment:  Procedure: RADIOLOGY WITH ANESTHESIA;  Surgeon:               Radiologist, Medication, MD;  Location: [facility] OR;  Service:               Radiology;  Laterality: N/A; No date: TUBAL LIGATION  BMI    Body Mass Index: 35.36 kg/m      Reproductive/Obstetrics negative OB ROS                              Anesthesia Physical Anesthesia Plan  ASA: 3  Anesthesia Plan: General   Post-op Pain Management:    Induction: Intravenous  PONV Risk Score and Plan: Propofol  infusion and TIVA  Airway Management Planned: Natural Airway and Nasal Cannula  Additional Equipment:   Intra-op Plan:   Post-operative Plan:   Informed Consent: I have reviewed the patients History and Physical, chart, labs and discussed the procedure including the risks, benefits and alternatives for the proposed anesthesia with the patient or authorized representative who has indicated his/her understanding and acceptance.     Dental Advisory Given  Plan Discussed with:  Anesthesiologist, CRNA and Surgeon  Anesthesia Plan Comments: (Patient consented for risks of anesthesia including but not limited to:  - adverse reactions to medications - risk of airway placement if required - damage to eyes, teeth, lips or other oral mucosa - nerve damage due to positioning  - sore throat or hoarseness - Damage to heart, brain, nerves, lungs, other parts of body or loss of life  Patient voiced understanding  and assent.)        Anesthesia Quick Evaluation

## 2024-10-07 NOTE — Anesthesia Postprocedure Evaluation (Signed)
 Anesthesia Post Note  Patient: Margaret Frye  Procedure(s) Performed: ECHOCARDIOGRAM, TRANSESOPHAGEAL CARDIOVERSION  Patient location during evaluation: Specials Recovery Anesthesia Type: General Level of consciousness: awake and alert Pain management: pain level controlled Vital Signs Assessment: post-procedure vital signs reviewed and stable Respiratory status: spontaneous breathing, nonlabored ventilation, respiratory function stable and patient connected to nasal cannula oxygen Cardiovascular status: blood pressure returned to baseline and stable Postop Assessment: no apparent nausea or vomiting Anesthetic complications: no   No notable events documented.   Last Vitals:  Vitals:   10/07/24 1345 10/07/24 1400  BP: 125/63 (!) 125/59  Pulse: 68 66  Resp: (!) 29 14  Temp:  36.7 C  SpO2: 95% 95%    Last Pain:  Vitals:   10/07/24 1400  TempSrc: Oral  PainSc: 0-No pain                 [name]

## 2024-10-08 ENCOUNTER — Encounter: Payer: Self-pay | Admitting: Cardiology

## 2024-10-08 DIAGNOSIS — M79645 Pain in left finger(s): Secondary | ICD-10-CM | POA: Diagnosis not present

## 2024-10-19 DIAGNOSIS — E118 Type 2 diabetes mellitus with unspecified complications: Secondary | ICD-10-CM | POA: Diagnosis not present

## 2024-10-19 DIAGNOSIS — E559 Vitamin D deficiency, unspecified: Secondary | ICD-10-CM | POA: Diagnosis not present

## 2024-10-19 DIAGNOSIS — Z1331 Encounter for screening for depression: Secondary | ICD-10-CM | POA: Diagnosis not present

## 2024-10-19 DIAGNOSIS — I10 Essential (primary) hypertension: Secondary | ICD-10-CM | POA: Diagnosis not present

## 2024-10-19 DIAGNOSIS — E538 Deficiency of other specified B group vitamins: Secondary | ICD-10-CM | POA: Diagnosis not present

## 2024-10-19 DIAGNOSIS — E039 Hypothyroidism, unspecified: Secondary | ICD-10-CM | POA: Diagnosis not present

## 2024-10-19 DIAGNOSIS — Z0000 Encounter for general adult medical examination without abnormal findings: Secondary | ICD-10-CM | POA: Diagnosis not present

## 2024-10-19 DIAGNOSIS — G894 Chronic pain syndrome: Secondary | ICD-10-CM | POA: Diagnosis not present

## 2024-10-19 DIAGNOSIS — D509 Iron deficiency anemia, unspecified: Secondary | ICD-10-CM | POA: Diagnosis not present

## 2024-10-19 DIAGNOSIS — E7800 Pure hypercholesterolemia, unspecified: Secondary | ICD-10-CM | POA: Diagnosis not present

## 2024-10-19 DIAGNOSIS — E66812 Obesity, class 2: Secondary | ICD-10-CM | POA: Diagnosis not present

## 2024-11-19 ENCOUNTER — Telehealth: Payer: Self-pay

## 2024-11-19 NOTE — Patient Outreach (Signed)
 Telephone outreach to patient to obtain mRS was successfully completed. MRS= 2  Margaret Frye [facility] VBCI Assistant Direct Dial: [phone]  Fax: [phone] Website: [email or web address]

## 2024-11-26 ENCOUNTER — Telehealth: Payer: Self-pay | Admitting: Oncology

## 2024-11-26 NOTE — Telephone Encounter (Signed)
 Pt called to change time of lab on 1/7 due to conflicting appt - changed time w/pt - pt confirmed new time - [facility]

## 2024-11-30 ENCOUNTER — Telehealth: Payer: Self-pay | Admitting: Oncology

## 2024-11-30 ENCOUNTER — Encounter: Payer: Self-pay | Admitting: Oncology

## 2024-11-30 NOTE — Telephone Encounter (Signed)
 Pt daughter called to poss r/s appt - spoke w/clinical team and got the okay to move appt back 1 month (per secure chat) - r/s appts w/pt daughter - sent appt reminders via mychart and mail - [facility]

## 2024-11-30 NOTE — Telephone Encounter (Signed)
 Pt daughter called to see if we could cancel lab appt for tomorrow since pt just had labs at [facility] yesterday - told pt daughter that I would call her back when I heard back from Finn's team - [facility]

## 2024-12-01 ENCOUNTER — Other Ambulatory Visit

## 2024-12-01 ENCOUNTER — Inpatient Hospital Stay

## 2024-12-02 ENCOUNTER — Ambulatory Visit

## 2024-12-02 ENCOUNTER — Ambulatory Visit: Admitting: Oncology

## 2025-01-11 ENCOUNTER — Inpatient Hospital Stay

## 2025-01-12 ENCOUNTER — Inpatient Hospital Stay

## 2025-01-12 ENCOUNTER — Inpatient Hospital Stay: Admitting: Oncology
